# Patient Record
Sex: Male | Born: 1955 | Race: White | Hispanic: No | Smoking: Never smoker
Health system: Southern US, Community
[De-identification: ages and names within clinical notes are randomized; demographics above are authoritative.]

## PROBLEM LIST (undated history)

## (undated) DIAGNOSIS — I10 Essential (primary) hypertension: Secondary | ICD-10-CM

## (undated) DIAGNOSIS — F329 Major depressive disorder, single episode, unspecified: Secondary | ICD-10-CM

## (undated) DIAGNOSIS — F419 Anxiety disorder, unspecified: Secondary | ICD-10-CM

## (undated) DIAGNOSIS — E785 Hyperlipidemia, unspecified: Secondary | ICD-10-CM

## (undated) DIAGNOSIS — F319 Bipolar disorder, unspecified: Secondary | ICD-10-CM

## (undated) DIAGNOSIS — E119 Type 2 diabetes mellitus without complications: Secondary | ICD-10-CM

## (undated) DIAGNOSIS — F32A Depression, unspecified: Secondary | ICD-10-CM

## (undated) DIAGNOSIS — N289 Disorder of kidney and ureter, unspecified: Secondary | ICD-10-CM

## (undated) HISTORY — PX: COLONOSCOPY: SHX174

## (undated) HISTORY — DX: Bipolar disorder, unspecified: F31.9

## (undated) HISTORY — DX: Depression, unspecified: F32.A

## (undated) HISTORY — DX: Anxiety disorder, unspecified: F41.9

## (undated) HISTORY — DX: Disorder of kidney and ureter, unspecified: N28.9

## (undated) HISTORY — DX: Type 2 diabetes mellitus without complications: E11.9

## (undated) HISTORY — DX: Major depressive disorder, single episode, unspecified: F32.9

---

## 1999-10-01 ENCOUNTER — Emergency Department (HOSPITAL_COMMUNITY): Admission: EM | Admit: 1999-10-01 | Discharge: 1999-10-01 | Payer: Self-pay | Admitting: Emergency Medicine

## 1999-10-01 ENCOUNTER — Encounter: Payer: Self-pay | Admitting: Emergency Medicine

## 2001-09-03 ENCOUNTER — Encounter: Payer: Self-pay | Admitting: *Deleted

## 2001-09-03 ENCOUNTER — Inpatient Hospital Stay (HOSPITAL_COMMUNITY): Admission: EM | Admit: 2001-09-03 | Discharge: 2001-09-05 | Payer: Self-pay | Admitting: Emergency Medicine

## 2001-09-04 ENCOUNTER — Encounter: Payer: Self-pay | Admitting: Critical Care Medicine

## 2005-04-03 ENCOUNTER — Encounter: Admission: RE | Admit: 2005-04-03 | Discharge: 2005-07-02 | Payer: Self-pay | Admitting: Geriatric Medicine

## 2005-07-14 ENCOUNTER — Encounter: Admission: RE | Admit: 2005-07-14 | Discharge: 2005-10-12 | Payer: Self-pay | Admitting: Geriatric Medicine

## 2010-11-26 ENCOUNTER — Emergency Department (HOSPITAL_COMMUNITY)
Admission: EM | Admit: 2010-11-26 | Discharge: 2010-11-26 | Payer: Self-pay | Source: Home / Self Care | Admitting: Emergency Medicine

## 2011-02-20 LAB — POCT I-STAT, CHEM 8
BUN: 13 mg/dL (ref 6–23)
Calcium, Ion: 1.15 mmol/L (ref 1.12–1.32)
Chloride: 105 mEq/L (ref 96–112)
Creatinine, Ser: 1 mg/dL (ref 0.4–1.5)
Glucose, Bld: 161 mg/dL — ABNORMAL HIGH (ref 70–99)
HCT: 48 % (ref 39.0–52.0)
Hemoglobin: 16.3 g/dL (ref 13.0–17.0)
Potassium: 3.7 mEq/L (ref 3.5–5.1)
Sodium: 140 mEq/L (ref 135–145)
TCO2: 25 mmol/L (ref 0–100)

## 2011-04-28 NOTE — H&P (Signed)
[place]. [facility]  Patient:    Alan Reeves, Alan Reeves Visit Number: [id number] MRN: [id number]          Service Type: MED Location: MICU 2113 01 Attending Physician:  Caleb Popp Dictated by:   Charlcie Cradle Delford Field, M.D. [facility] Admit Date:  09/03/2001   CC:         Alan Reeves, M.D.  [facility]   History and Physical  CHIEF COMPLAINT:  Overdose ingestion of methanol and lithium.  HISTORY OF PRESENT ILLNESS:  This is a 55 year old white male with a previous history of long-standing depression.  He had been taken off Lithium since June of this year and taking over-the-counter medications for depression.  He noted increased moodiness, and increased levels of depression over the last week. He began decreasing his oral intake and then today, [facility] and receiving counseling today, took 41, 300 mg Lithium tablets, short acting, and approximately 8-10 ounces of windshield wiper fluid containing methanol.  He had immediate emesis after taking the windshield wiper fluid.  It is uncertain how much was actually absorbed.  He also had some diarrhea.  No real headaches, no real neurologic change.  No chest pain, no real shortness of breath, no real cough, no change in vision, no abdominal pain, no muscle spasm or weakness noted.  No dizziness noted, but does not continuing nausea in spite of emesis earlier today.  He had about five episodes of emesis with the diarrhea earlier today.  The patient now states he is hungry and wants to take something by mouth.  He drove himself to the emergency room and presented here this afternoon.  He took this ingestion at approximately 3:30 p.m. today.  PAST MEDICAL HISTORY:  Medical history of depression as noted above, otherwise noncontributory.  ALLERGIES:  NONE.  OPERATIONS:  Arthroscopic surgery in the past of the knee, left sided  lipoma removal.  SOCIAL HISTORY:  The patient is a nonsmoker, does not drink alcohol.  He is separated from his wife.  His daughter is 90 years of age and now living with the wife.  He does not use recreational drugs.  He is currently unemployed Medical illustrator.  REVIEW OF SYSTEMS:  Otherwise noncontributory.  PHYSICAL EXAMINATION:  VITAL SIGNS:  Temperature 97, blood pressure 122/85, pulse 70, respirations 16, saturations 97% on room air.  CHEST:  Entirely clear to auscultation and percussion.  There is no evidence of wheeze, rale, or rhonchi.  CARDIAC:  Exam showed a regular rate and rhythm with S3, no S1 or S2.  ABDOMEN: Soft, nontender.  Bowel sounds are active.  EXTREMITIES:  Showed no edema, clubbing or ___________.  NEUROLOGIC:  Intact.  There were no focal deficits noted.  HEENT: Oropharynx is clear.  Nares are clear.  No thyromegaly.  LABORATORY DATA:  Sodium 134, potassium 3.8, chloride  104, CO2 22, BUN 10. Hemoglobin 15.  Total CO2 now 22, pH 7.27, PCO2 45, PO2 490 on oxygen therapy. EKG showed normal sinus rhythm. Chest x-ray is pending at the time of this dictation.  EKG:  Normal sinus rhythm, no acute changes.  IMPRESSION: Overdose of methanol and Lithium carbonate.  Patient needs renal consultation and evaluation for degrees of levels of these intoxicants.  The patient also has an associated metabolic acidosis with an increase in the anion gap.  RECOMMENDATIONS:  Give IV bicarbonate, give IV fluids using hypotonic saline. Administer fomepizole 1200 mg IV  times loading dose than 800 mg IV q.12 for 48 hours then 1200 mg IV every 12 hours thereafter.  Also administer folic acid or leucovorin 50 mg IV q.4h. for the next 24 hours.  The patient had a lithium level and methanol level obtained.  Also check CMET, CBC, TSH levels.  Chest chest x-ray, check serum osmolarity levels and obtain renal consultation for possible hemodialysis.  Admit to the intensive care unit  for further monitoring. Dictated by:   Charlcie Cradle Delford Field, M.D. [facility] Attending Physician:  Caleb Popp DD:  09/03/01 TD:  09/03/01 Job: [phone] [id number]

## 2011-04-28 NOTE — Consult Note (Signed)
[place]. [facility]  Patient:    Alan Reeves, Alan Reeves Visit Number: [id number] MRN: [id number]          Service Type: MED Location: MICU 2113 01 Attending Physician:  Caleb Popp Dictated by:   Kevin Fenton, M.D. Admit Date:  09/03/2001                            Consultation Report  CHIEF COMPLAINT:  A lithium and methanol overdose.  HISTORY OF PRESENT ILLNESS:  This is a 55 year old Caucasian male who was admitted from the [facility] ER secondary to a lithium and methanol ingestion today.  Patient has a history of depression with suicidal ideation.  He admits to worsening depression over the past two weeks with acute worsening in the past two days.  He was seen by mental health yesterday and given a prescription for the lithium controlled-release tablets.  Today he took 41 300 mg controlled-release tablets between 3 and 3:30 p.m. today and at 3:30, drank 8 to 10 ounces of windshield washer fluid.  Patient wanted to go to sleep and "wake up in heaven."  He admits to this being a suicidal gesture. He states that he is not suicidal now.  Patient drove himself to the ER at approximately 3:45, where he remains.  He has vomited four or five times since then and had diarrhea x 3.  He says the lithium tablets he took are pink.  PAST MEDICAL HISTORY:  Positive for depression with hospitalization in 1982, 1989 and 1995; last one was for a suicidal gesture with Tylenol and Zoloft overdose.  He was under the care of [facility] until June of 2002.  MEDICATIONS:  He takes none but he was taking Sam-e until one week ago.  ALLERGIES:  No known drug allergies.  SOCIAL HISTORY:  He is divorced x 4 and lives with his mother.  Denies alcohol, drugs or tobacco.  FAMILY HISTORY:  Positive for bipolar.  Negative for any kidney diseases.  REVIEW OF SYSTEMS:  As above.  PHYSICAL EXAMINATION:  VITAL SIGNS:  Heart rate 60, BP 122/85, respiratory  rate 22.  Weight is 80 g.  GENERAL:  He is alert and oriented x 4.  He is bright and alert but has very poor insight.  LUNGS:  Clear to auscultation bilaterally.  HEENT:  Disks margins are sharp.  ABDOMEN:  Soft, nontender.  Good bowel sounds.  EXTREMITIES:  No edema.  DP pulses are 2+.  LABORATORY DATA:  Labs are pending at this time.  ASSESSMENT AND PLAN:  [age]-year-old Caucasian male with intentional lithium and methanol overdose.  Will give charcoal and sorbitol 100 g p.o. The lithium are sustained-release and this will help to keep the absorption minimized.  We will also place Foley to gravity with normal saline at 300 cc/hr.  Will attempt to flush the amount of lithium through the proximal tubules of the kidneys with sodium to cause excretion.  Patient is now likely to get increased hypernatremia in the first 24 hours of normal saline and we need to excrete the sodium in order to excrete the lithium so we will keep patient volume repleted.  If the lithium level is greater than 4, we will dialyze immediately; if the lithium level is 2.5 to 4.0, will dialyze if urine output is inadequate.  We will also thyroid-stimulating hormone.  For methanol, we will use fomepizole 50 mg/kg, a loading dose, then 10  mg/kg q.12h. for 48 hours.  This inhibits alcohol dehydrogenase.  Will also give folic acid, thiamine and pyridoxine.  We will be closely following electrolytes ______ changes and acid base balances.  Methanol can cause profound metabolic anion gap acidosis. Dictated by:   Kevin Fenton, M.D. Attending Physician:  Caleb Popp DD:  09/03/01 TD:  09/04/01 Job: [phone] [id number]

## 2011-04-28 NOTE — Discharge Summary (Signed)
[place]. [facility]  Patient:    Alan Reeves, Alan Reeves Visit Number: [id number] MRN: [id number]          Service Type: MED Location: MICU 2113 01 Attending Physician:  Caleb Popp Dictated by:   Charlcie Cradle Delford Field, M.D. [facility] Admit Date:  09/03/2001 Discharge Date: 09/05/2001   CC:         Fax to [facility]  Patients chart  Alan Reeves, M.D.   Discharge Summary  DISCHARGE DIAGNOSES: 1. Methanol and lithium overdose. 2. Severe depression.  HISTORY OF PRESENT ILLNESS:  This is a 55 year old white male admitted from the emergency department with depression and overdose of windshield wiper fluid as well as toxic levels of lithium.  Patient is admitted now to the intensive care for further inpatient care.  PAST MEDICAL HISTORY:  Depression.  OPERATIONS:  Arthroscopic knee surgery, lipoma removal, left side.  SOCIAL HISTORY:  Patient is separated, has one daughter living with his wife in [place].  Is a nonsmoker, does not drink.  FAMILY HISTORY:  Noncontributory.  REVIEW OF SYSTEMS:  Noncontributory.  ALLERGIES:  None.  MEDICATIONS PRIOR TO ADMISSION:  None.  Was taking over-the-counter medication but had old lithium carbonate tablets available at home and took these on his own.  PHYSICAL EXAMINATION:  VITAL SIGNS:  Temperature 96, pulse 75, blood pressure 122/70, respirations 19.  GENERAL:  This is an awake and alert white male in no acute distress.  CHEST:  Clear.  CARDIAC:  Exam showed a regular rate and rhythm without S3, S4, normal S1, S2.  ABDOMEN:  Soft, nontender.  Bowel sounds active.  EXTREMITIES:  No edema, or clubbing, or venous disease.  NEUROLOGIC:  Intact.  LABORATORY DATA:  Acetaminophen level 2, lithium level 2, creatinine 0.9, serum osmolality 302.  Sodium 142, potassium 3.9, chloride 107, BUN 11, hemoglobin 10.7.  CO2 of 29.  HOSPITAL COURSE:  Patient was treated with IV fluids, ______  infusion, thiamine, and folic acid.  Overall, the patient did well and was medically clear for discharge to psychiatric facility on September 05, 2001.  Patient will be discharged and committed to the [facility] for further psychiatric care for his severe depression. Dictated by:   Charlcie Cradle Delford Field, M.D. [facility] Attending Physician:  Caleb Popp DD:  09/05/01 TD:  09/05/01 Job: [phone] [id number]

## 2012-05-09 IMAGING — CT CT HEAD W/O CM
1 series · 15 of 30 positions shown, 19 images · non-contrast
Comparison: None.

CLINICAL DATA: Vertigo; multiple episodes of dizziness for 3 days.
Headache.  History of bipolar disorder, hypertension and diabetes.

CT HEAD WITHOUT CONTRAST
TECHNIQUE: Contiguous axial images were obtained from the base of
the skull through the vertex without contrast.

[Series 2: headseq 4.8 h45s · axial · 0.43mm/px · z∈[-174,-19]mm · 15 of 36 slices shown, 19 images]
[im 2/36  brain]
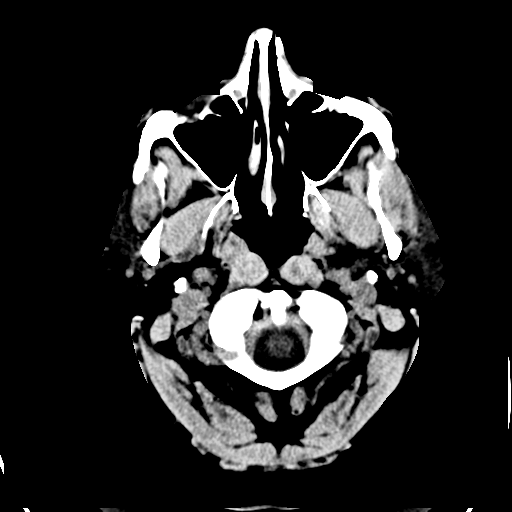
[im 2/36  bone]
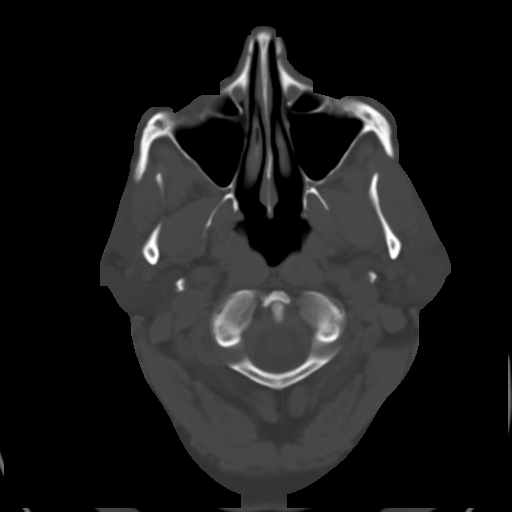
[im 4/36  brain]
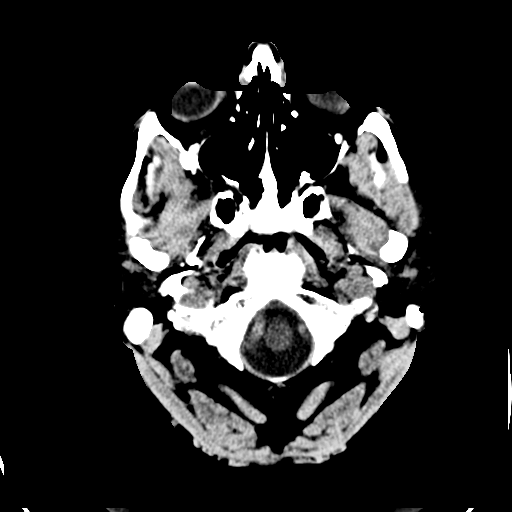
[im 7/36  brain]
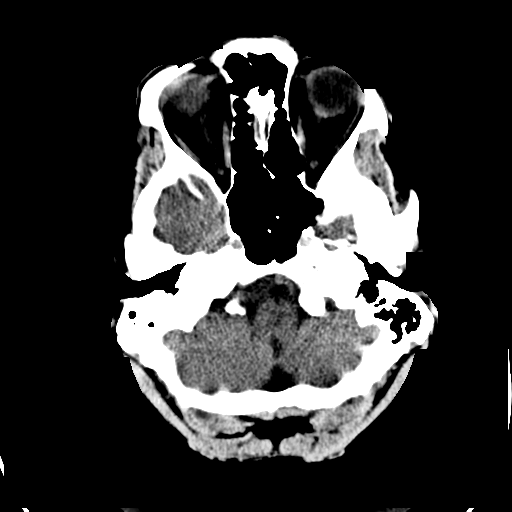
[im 9/36  brain]
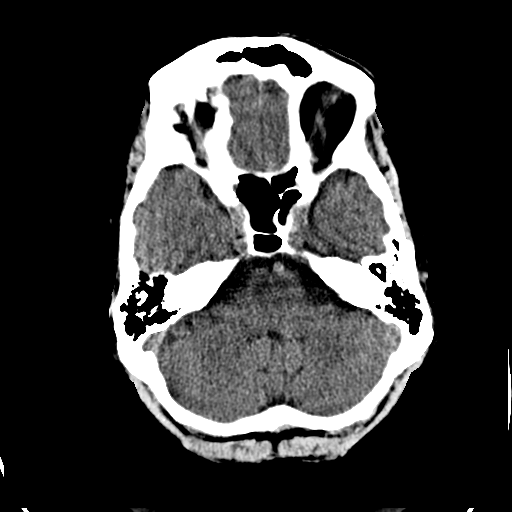
[im 11/36  brain]
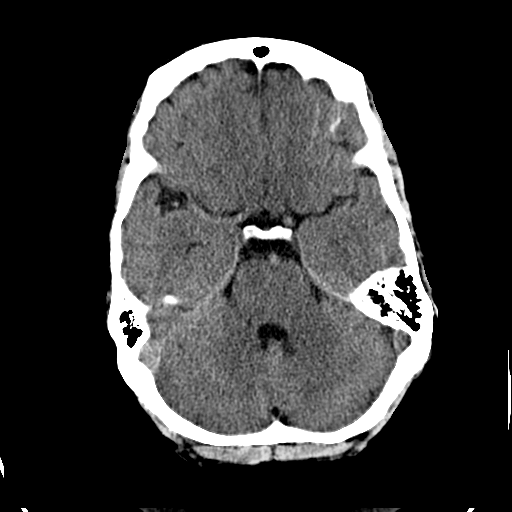
[im 11/36  bone]
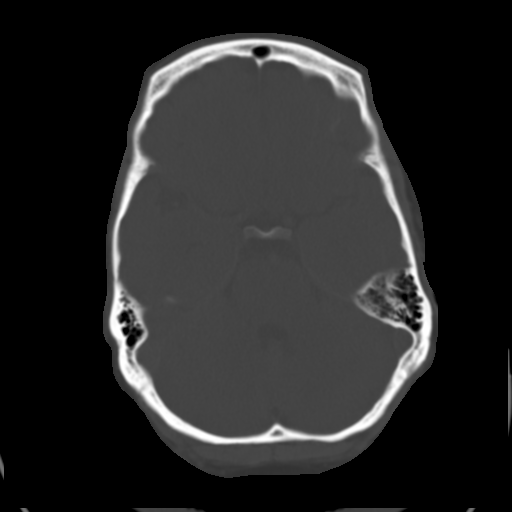
[im 14/36  brain]
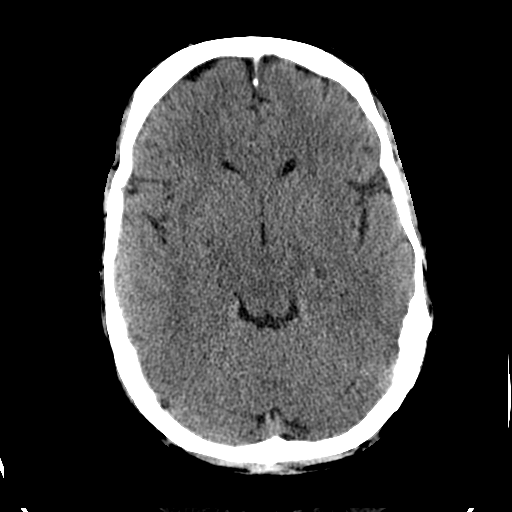
[im 16/36  brain]
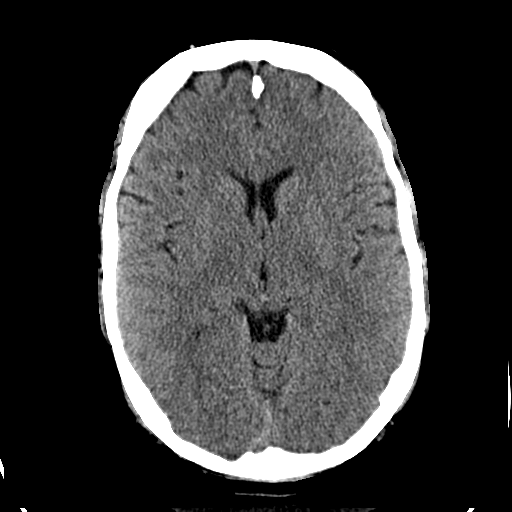
[im 19/36  brain]
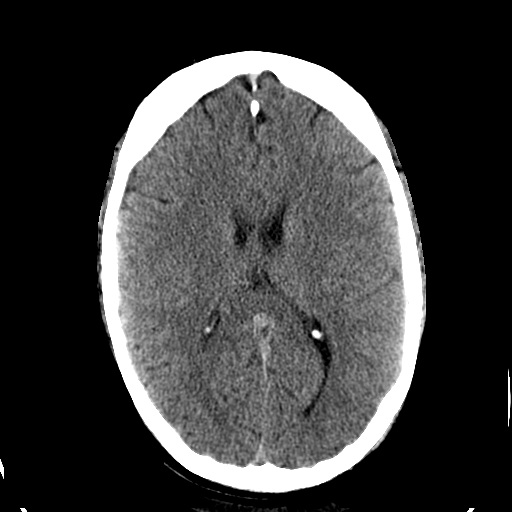
[im 20/36  brain]
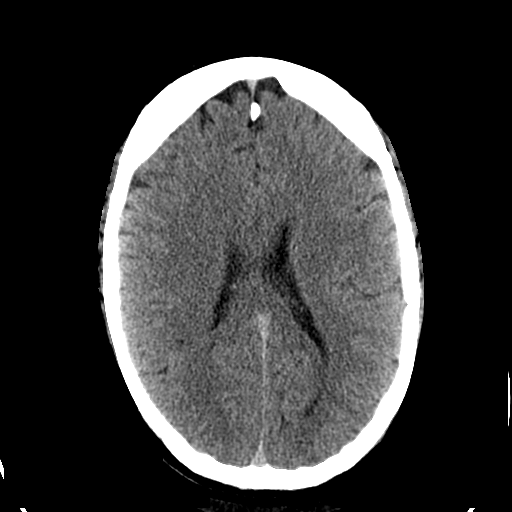
[im 20/36  bone]
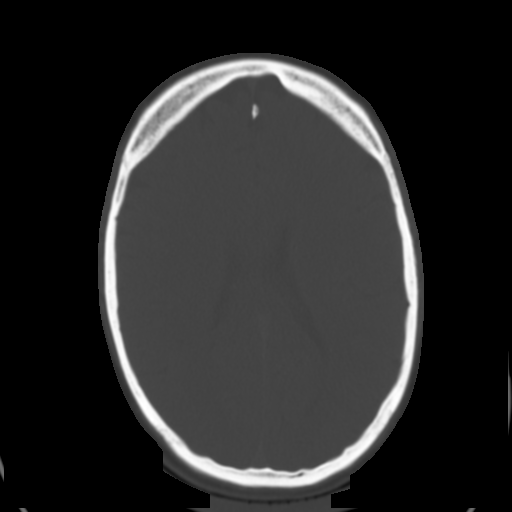
[im 22/36  brain]
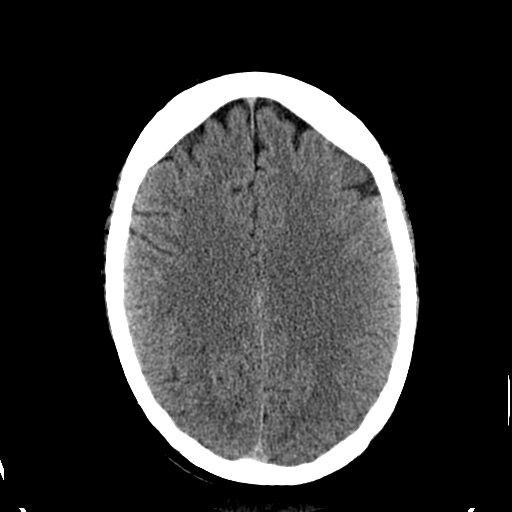
[im 25/36  brain]
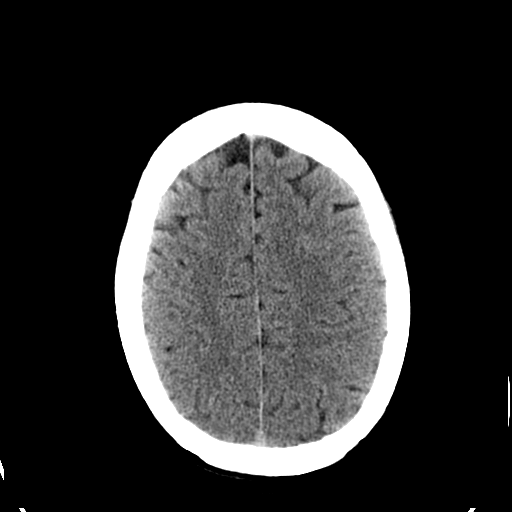
[im 27/36  brain]
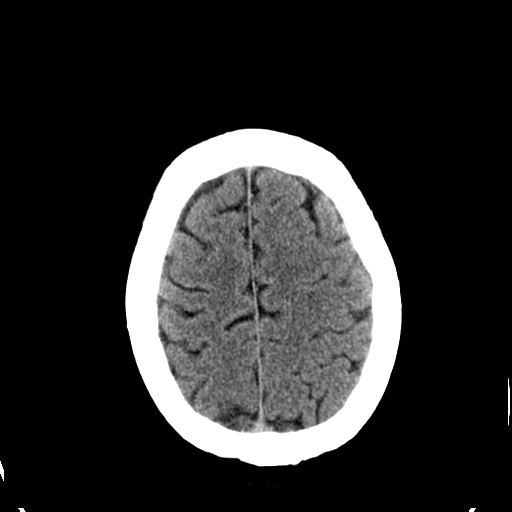
[im 29/36  brain]
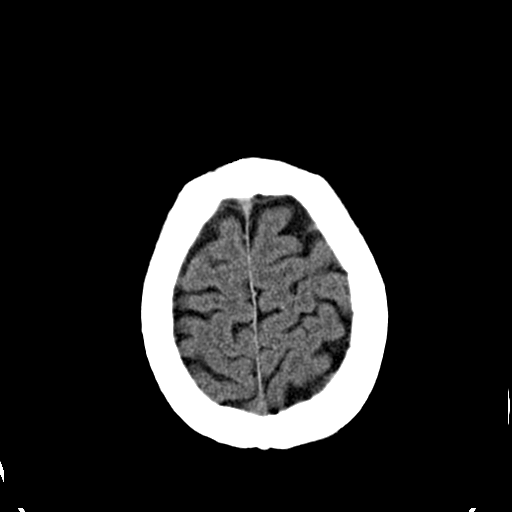
[im 29/36  bone]
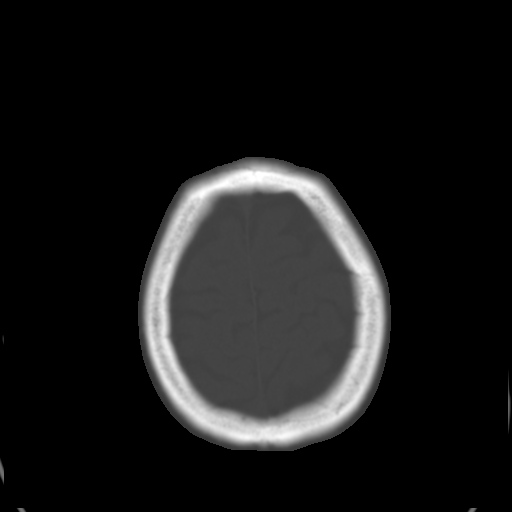
[im 32/36  brain]
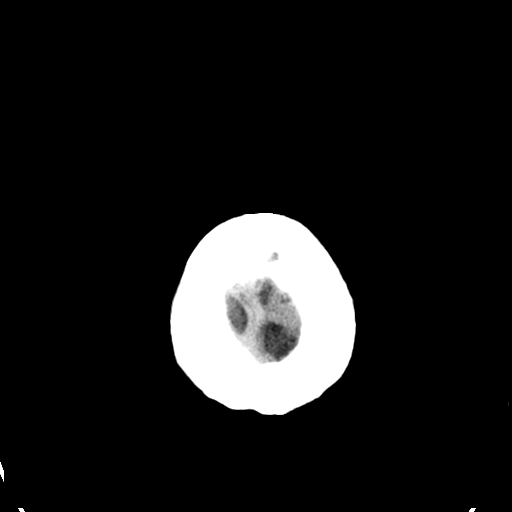
[im 34/36  brain]
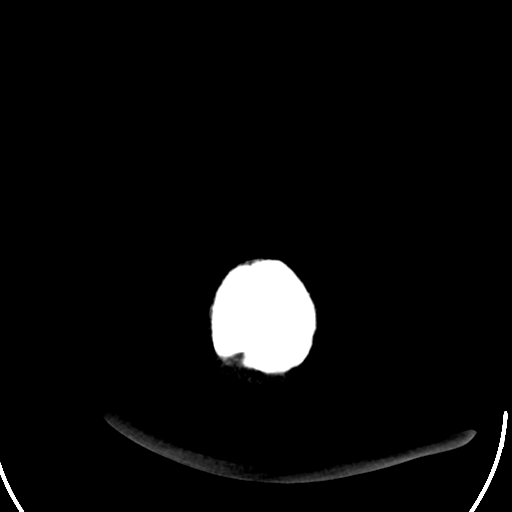

[15 of 30 positions shown; findings below may reference images not displayed]

FINDINGS: There is no evidence of acute infarction, mass lesion, or
intra- or extra-axial hemorrhage on CT.

A small focus of hypoattenuation within the left parietal lobe may
reflect either a normal sulcus or a small chronic lacunar infarct.

The posterior fossa, including the cerebellum, brainstem and fourth
ventricle, is within normal limits.  The third and lateral
ventricles, and basal ganglia are unremarkable in appearance.  The
cerebral hemispheres demonstrate normal gray-white differentiation.
No mass effect or midline shift is seen.

There is no evidence of fracture; there is suggestion of a small 6
mm osteoma within the right mastoid air cells.  The orbits are
within normal limits.  The paranasal sinuses and mastoid air cells
are well-aerated.  No significant soft tissue abnormalities are
seen.
IMPRESSION: 1.  No acute intracranial pathology seen.
2.  Small focus of hypoattenuation in the left parietal lobe may
reflect either a normal sulcus or a small chronic lacunar infarct.
3.  Likely small 6 mm osteoma noted at the right mastoid air cells.

## 2014-03-31 ENCOUNTER — Encounter: Payer: Self-pay | Admitting: Internal Medicine

## 2014-03-31 ENCOUNTER — Ambulatory Visit: Payer: Self-pay | Attending: Internal Medicine | Admitting: Internal Medicine

## 2014-03-31 VITALS — BP 132/89 | HR 87 | Temp 98.8°F | Resp 16 | Ht 71.0 in | Wt 205.0 lb

## 2014-03-31 DIAGNOSIS — Z7689 Persons encountering health services in other specified circumstances: Secondary | ICD-10-CM

## 2014-03-31 DIAGNOSIS — R7309 Other abnormal glucose: Secondary | ICD-10-CM

## 2014-03-31 DIAGNOSIS — F411 Generalized anxiety disorder: Secondary | ICD-10-CM | POA: Insufficient documentation

## 2014-03-31 DIAGNOSIS — E119 Type 2 diabetes mellitus without complications: Secondary | ICD-10-CM | POA: Insufficient documentation

## 2014-03-31 DIAGNOSIS — R739 Hyperglycemia, unspecified: Secondary | ICD-10-CM

## 2014-03-31 DIAGNOSIS — Z7189 Other specified counseling: Secondary | ICD-10-CM

## 2014-03-31 DIAGNOSIS — F319 Bipolar disorder, unspecified: Secondary | ICD-10-CM | POA: Insufficient documentation

## 2014-03-31 DIAGNOSIS — Z008 Encounter for other general examination: Secondary | ICD-10-CM | POA: Insufficient documentation

## 2014-03-31 LAB — CBC WITH DIFFERENTIAL/PLATELET
Basophils Absolute: 0.1 10*3/uL (ref 0.0–0.1)
Basophils Relative: 1 % (ref 0–1)
EOS ABS: 0.3 10*3/uL (ref 0.0–0.7)
EOS PCT: 3 % (ref 0–5)
HCT: 43.6 % (ref 39.0–52.0)
HEMOGLOBIN: 15.1 g/dL (ref 13.0–17.0)
LYMPHS ABS: 2.3 10*3/uL (ref 0.7–4.0)
Lymphocytes Relative: 27 % (ref 12–46)
MCH: 29.5 pg (ref 26.0–34.0)
MCHC: 34.6 g/dL (ref 30.0–36.0)
MCV: 85.2 fL (ref 78.0–100.0)
MONOS PCT: 8 % (ref 3–12)
Monocytes Absolute: 0.7 10*3/uL (ref 0.1–1.0)
NEUTROS PCT: 61 % (ref 43–77)
Neutro Abs: 5.1 10*3/uL (ref 1.7–7.7)
Platelets: 313 10*3/uL (ref 150–400)
RBC: 5.12 MIL/uL (ref 4.22–5.81)
RDW: 13.4 % (ref 11.5–15.5)
WBC: 8.4 10*3/uL (ref 4.0–10.5)

## 2014-03-31 LAB — POCT GLYCOSYLATED HEMOGLOBIN (HGB A1C): Hemoglobin A1C: 8.5

## 2014-03-31 LAB — COMPREHENSIVE METABOLIC PANEL
ALT: 29 U/L (ref 0–53)
AST: 18 U/L (ref 0–37)
Albumin: 4.4 g/dL (ref 3.5–5.2)
Alkaline Phosphatase: 123 U/L — ABNORMAL HIGH (ref 39–117)
BILIRUBIN TOTAL: 0.4 mg/dL (ref 0.2–1.2)
BUN: 15 mg/dL (ref 6–23)
CALCIUM: 9.6 mg/dL (ref 8.4–10.5)
CHLORIDE: 102 meq/L (ref 96–112)
CO2: 24 mEq/L (ref 19–32)
Creat: 1.12 mg/dL (ref 0.50–1.35)
GLUCOSE: 357 mg/dL — AB (ref 70–99)
Potassium: 4.4 mEq/L (ref 3.5–5.3)
Sodium: 135 mEq/L (ref 135–145)
TOTAL PROTEIN: 7.3 g/dL (ref 6.0–8.3)

## 2014-03-31 LAB — LIPID PANEL
Cholesterol: 164 mg/dL (ref 0–200)
HDL: 34 mg/dL — ABNORMAL LOW (ref >39)
LDL Cholesterol: 57 mg/dL (ref 0–99)
TRIGLYCERIDES: 367 mg/dL — AB (ref <150)
Total CHOL/HDL Ratio: 4.8 Ratio
VLDL: 73 mg/dL — AB (ref 0–40)

## 2014-03-31 LAB — GLUCOSE, POCT (MANUAL RESULT ENTRY): POC Glucose: 397 mg/dl — AB (ref 70–99)

## 2014-03-31 LAB — TSH: TSH: 0.613 u[IU]/mL (ref 0.350–4.500)

## 2014-03-31 MED ORDER — METFORMIN HCL 500 MG PO TABS: 500.0000 mg | ORAL_TABLET | Freq: Two times a day (BID) | ORAL | Status: DC

## 2014-03-31 MED ORDER — LISINOPRIL 20 MG PO TABS: 20.0000 mg | ORAL_TABLET | Freq: Every day | ORAL | Status: DC

## 2014-03-31 MED ORDER — INSULIN NPH (HUMAN) (ISOPHANE) 100 UNIT/ML ~~LOC~~ SUSP
20.0000 [IU] | Freq: Once | SUBCUTANEOUS | Status: AC
  Administered 2014-03-31: 20 [IU] via SUBCUTANEOUS

## 2014-03-31 MED ORDER — GLIPIZIDE 5 MG PO TABS: 5.0000 mg | ORAL_TABLET | Freq: Every day | ORAL | Status: DC

## 2014-03-31 NOTE — Progress Notes (Signed)
Pt comes in to establish care for Diabetes Type 2 and Depression/Anxiety which is [facility] Pt ran out of Metformin x 1 mnth ago but is taking Glipizide 5 mg dosage Denies blurry vision,nausea or dizziness Last checked CBG and A1C 6 mnths ago Pt prefers [facility] pharmacy Taking Lisinopril 20 mg for kidney reserve Pt had colonoscopy screening 3 mnths ago

## 2014-03-31 NOTE — Progress Notes (Signed)
Patient ID: Alan MulliganOliver J Axelrod, male   DOB: June 09, 1956, 58 y.o.   MRN: [id number]     ION:[id number]:[id number]  KGM:[id number]  DOB - June 09, 1956  CC:  Chief Complaint  Patient presents with  . Establish Care  . Diabetes  . Depression  . Anxiety       HPI: Alan RougeOliver Reeves is a 58 y.o. male with a past medical history of DM, bipolar disorder, anxiety, and depression. Patient is here to establish care and to get medication refills.  Patient states he has been out of his Metformin for over one month and noticed he was starting to have increased symptoms relating to his diabetes.  Patient reports that he was beginning to have polydipsia, blurred vision, and fatigue and decided to come to the doctor.  Patient states that his hemoglobin a1c is usually less than 7 and always controlled. He reports mild left shoulder muscle pain but relates it to his job.  Patient reports he had a colonoscopy last year and had a dilated eye exam 5 months ago.  He is up to date on his tetanus shot.   Patient has No headache, No chest pain, No abdominal pain - No Nausea, No new weakness tingling or numbness, No Cough - SOB.  No Known Allergies Past Medical History  Diagnosis Date  . Anxiety   . Depression   . Diabetes mellitus without complication    No current outpatient prescriptions on file prior to visit.   No current facility-administered medications on file prior to visit.   History reviewed. No pertinent family history. History   Social History  . Marital Status: Divorced    Spouse Name: N/A    Number of Children: N/A  . Years of Education: N/A   Occupational History  . Not on file.   Social History Main Topics  . Smoking status: Never Smoker   . Smokeless tobacco: Not on file  . Alcohol Use: No  . Drug Use: No  . Sexual Activity: Not on file   Other Topics Concern  . Not on file   Social History Narrative  . No narrative on file    Review of Systems: Constitutional: Negative for fever,  chills, diaphoresis, activity change, appetite change. +fatigue, polydipsia HENT: Negative for ear pain, nosebleeds, congestion, facial swelling, rhinorrhea, neck pain, neck stiffness and ear discharge.  Eyes: Negative for pain, discharge, redness, itching. + blurred vision Respiratory: Negative for cough, choking, chest tightness, shortness of breath, wheezing and stridor.  Cardiovascular: Negative for chest pain, palpitations and leg swelling. Gastrointestinal: Negative for abdominal distention. Genitourinary: Negative for dysuria, urgency, frequency, hematuria, flank pain, decreased urine volume, difficulty urinating and dyspareunia.  Musculoskeletal: Negative for back pain, joint swelling, arthralgia and gait problem. + occasional left shoulder pain Neurological: Negative for dizziness, tremors, seizures, syncope, facial asymmetry, speech difficulty, weakness, light-headedness, numbness and headaches.  Hematological: Negative for adenopathy. Does not bruise/bleed easily. Psychiatric/Behavioral: Negative for hallucinations, behavioral problems, confusion, dysphoric mood, decreased concentration and agitation.    Objective:   Filed Vitals:   03/31/14 1520  BP: 132/89  Pulse: 87  Temp: 98.8 F (37.1 C)  Resp: 16    Physical Exam: Constitutional: Patient appears well-developed and well-nourished. No distress. HENT: Normocephalic, atraumatic, External right and left ear normal. Oropharynx is clear and moist.  Eyes: Conjunctivae and EOM are normal. PERRLA, no scleral icterus. Neck: Normal ROM. Neck supple. No JVD. No tracheal deviation. No thyromegaly. CVS: RRR, S1/S2 +, no murmurs, no gallops, no carotid bruit.  Pulmonary: Effort and breath sounds normal, no stridor, rhonchi, wheezes, rales.  Abdominal: Soft. BS +, no distension, tenderness, rebound or guarding.  Musculoskeletal: Normal range of motion. No edema and no tenderness.  Lymphadenopathy: No lymphadenopathy noted, cervical,    Neuro: Alert. Normal reflexes, muscle tone coordination. No cranial nerve deficit. Skin: Skin is warm and dry. No rash noted. Not diaphoretic. No erythema. No pallor. Psychiatric: Normal mood and affect. Behavior, judgment, thought content normal.  Lab Results  Component Value Date   HGB 16.3 11/26/2010   HCT 48.0 11/26/2010   Lab Results  Component Value Date   CREATININE 1.0 11/26/2010   BUN 13 11/26/2010   NA 140 11/26/2010   K 3.7 11/26/2010   CL 105 11/26/2010    Lab Results  Component Value Date   HGBA1C 8.5 03/31/2014   Lipid Panel  No results found for this basename: chol, trig, hdl, cholhdl, vldl, ldlcalc       Assessment and plan:   1. Diabetes - HgB A1c - Glucose (CBG) - metFORMIN (GLUCOPHAGE) 500 MG tablet; Take 1 tablet (500 mg total) by mouth 2 (two) times daily with a meal. Pt takes 1,000mg  at night.  Dispense: 90 tablet; Refill: 2 - lisinopril (PRINIVIL,ZESTRIL) 20 MG tablet; Take 1 tablet (20 mg total) by mouth daily.  Dispense: 30 tablet; Refill: 2 - glipiZIDE (GLUCOTROL) 5 MG tablet; Take 1 tablet (5 mg total) by mouth daily before breakfast.  Dispense: 30 tablet; Refill: 2  2. Hyperglycemia - insulin NPH Human (HUMULIN N,NOVOLIN N) injection 20 Units; Inject 0.2 mLs (20 Units total) into the skin once.  3. Encounter to establish care - CBC with Differential - Comprehensive metabolic panel - Lipid Panel - TSH - PSA   Return in about 3 months (around 06/30/2014) for Diabetes Mellitus.  The patient was told to call to get lab results if they haven't heard anything in the next week.     Holland CommonsValerie Branton Einstein, NP-C [facility] [phone] 03/31/2014, 10:46 PM

## 2014-03-31 NOTE — Patient Instructions (Signed)

## 2014-03-31 NOTE — Progress Notes (Signed)
CBG-397, A1C 8.5 20 units given per protocol

## 2014-04-01 ENCOUNTER — Other Ambulatory Visit: Payer: Self-pay | Admitting: Internal Medicine

## 2014-04-01 ENCOUNTER — Telehealth: Payer: Self-pay | Admitting: Emergency Medicine

## 2014-04-01 DIAGNOSIS — E781 Pure hyperglyceridemia: Secondary | ICD-10-CM

## 2014-04-01 LAB — PSA: PSA: 1.28 ng/mL (ref <=4.00)

## 2014-04-01 MED ORDER — OMEGA-3-ACID ETHYL ESTERS 1 G PO CAPS: 2.0000 g | ORAL_CAPSULE | Freq: Two times a day (BID) | ORAL | Status: DC

## 2014-04-01 NOTE — Telephone Encounter (Signed)
Message copied by Darlis LoanSMITH, Christean Silvestri D on Wed Apr 01, 2014  2:35 PM ------      Message from: Holland CommonsKECK, VALERIE A      Created: Wed Apr 01, 2014  9:46 AM       Call patient to let him know his triglycerides are high. I have sent omega 3 to our pharmacy.  He will need to take this twice daily until next follow up visit. With better control of DM, it should help improve triglycerides as well. Counsel patient on diet and exercise. May add more salmon to diet to help lower levels. Thanks ------

## 2014-04-01 NOTE — Telephone Encounter (Signed)
Pt given lab results and medication/diet instructions [facility]

## 2014-04-20 ENCOUNTER — Ambulatory Visit: Payer: Self-pay

## 2014-06-29 ENCOUNTER — Other Ambulatory Visit: Payer: Self-pay | Admitting: Internal Medicine

## 2014-06-30 ENCOUNTER — Encounter: Payer: Self-pay | Admitting: Internal Medicine

## 2014-06-30 ENCOUNTER — Ambulatory Visit: Payer: No Typology Code available for payment source | Attending: Internal Medicine | Admitting: Internal Medicine

## 2014-06-30 VITALS — BP 123/65 | HR 74 | Temp 97.8°F | Resp 16 | Ht 71.5 in | Wt 203.4 lb

## 2014-06-30 DIAGNOSIS — F411 Generalized anxiety disorder: Secondary | ICD-10-CM | POA: Insufficient documentation

## 2014-06-30 DIAGNOSIS — F329 Major depressive disorder, single episode, unspecified: Secondary | ICD-10-CM | POA: Insufficient documentation

## 2014-06-30 DIAGNOSIS — Z794 Long term (current) use of insulin: Secondary | ICD-10-CM | POA: Insufficient documentation

## 2014-06-30 DIAGNOSIS — M25519 Pain in unspecified shoulder: Secondary | ICD-10-CM | POA: Insufficient documentation

## 2014-06-30 DIAGNOSIS — F3289 Other specified depressive episodes: Secondary | ICD-10-CM | POA: Insufficient documentation

## 2014-06-30 DIAGNOSIS — Z79899 Other long term (current) drug therapy: Secondary | ICD-10-CM | POA: Insufficient documentation

## 2014-06-30 DIAGNOSIS — R7309 Other abnormal glucose: Secondary | ICD-10-CM

## 2014-06-30 DIAGNOSIS — E781 Pure hyperglyceridemia: Secondary | ICD-10-CM | POA: Insufficient documentation

## 2014-06-30 DIAGNOSIS — M25512 Pain in left shoulder: Secondary | ICD-10-CM

## 2014-06-30 DIAGNOSIS — M25511 Pain in right shoulder: Secondary | ICD-10-CM

## 2014-06-30 DIAGNOSIS — E119 Type 2 diabetes mellitus without complications: Secondary | ICD-10-CM | POA: Insufficient documentation

## 2014-06-30 DIAGNOSIS — R739 Hyperglycemia, unspecified: Secondary | ICD-10-CM

## 2014-06-30 LAB — GLUCOSE, POCT (MANUAL RESULT ENTRY)
POC GLUCOSE: 286 mg/dL — AB (ref 70–99)
POC Glucose: 278 mg/dl — AB (ref 70–99)

## 2014-06-30 LAB — POCT GLYCOSYLATED HEMOGLOBIN (HGB A1C): Hemoglobin A1C: 7.7

## 2014-06-30 MED ORDER — GLIPIZIDE 5 MG PO TABS: 5.0000 mg | ORAL_TABLET | Freq: Every day | ORAL | Status: DC

## 2014-06-30 MED ORDER — DICLOFENAC SODIUM 1 % TD GEL: 2.0000 g | Freq: Four times a day (QID) | TRANSDERMAL | Status: DC

## 2014-06-30 MED ORDER — LISINOPRIL 20 MG PO TABS: 20.0000 mg | ORAL_TABLET | Freq: Every day | ORAL | Status: DC

## 2014-06-30 MED ORDER — INSULIN ASPART 100 UNIT/ML ~~LOC~~ SOLN
10.0000 [IU] | Freq: Once | SUBCUTANEOUS | Status: AC
  Administered 2014-06-30: 10 [IU] via SUBCUTANEOUS

## 2014-06-30 MED ORDER — OMEGA-3-ACID ETHYL ESTERS 1 G PO CAPS: 2.0000 g | ORAL_CAPSULE | Freq: Two times a day (BID) | ORAL | Status: DC

## 2014-06-30 NOTE — Progress Notes (Signed)
Patient ID: Alan Reeves, male   DOB: March 30, 1956, 58 y.o.   MRN: [id number]  CC: follow up DM  HPI:  Patient is here for a three month follow up for DM.  Patient reports that he currently does not check his CBG at home because he does not like pricking himself.  He reports that he does take his medication daily.  Patient admits to eating lots of candy bars and sub bread.  Patient reports bilateral shoulder pain for the past two months.  It begins in bilateral shoulders and radiates to his elbows.  Pain is aggravated by reaching back or above his head.  Patient thinks he hurt himself when he was lifting a tanning bed for his daughter.    No Known Allergies Past Medical History  Diagnosis Date  . Anxiety   . Depression   . Diabetes mellitus without complication    Current Outpatient Prescriptions on File Prior to Visit  Medication Sig Dispense Refill  . glipiZIDE (GLUCOTROL) 5 MG tablet Take 1 tablet (5 mg total) by mouth daily before breakfast.  30 tablet  2  . lisinopril (PRINIVIL,ZESTRIL) 20 MG tablet Take 1 tablet (20 mg total) by mouth daily.  30 tablet  2  . lithium 300 MG tablet Take 300 mg by mouth 3 (three) times daily.      . metFORMIN (GLUCOPHAGE) 500 MG tablet TAKE ONE TABLET BY MOUTH WITH BREAKFAST, TAKE 2 TABLETS AT NIGHT  90 tablet  2  . venlafaxine (EFFEXOR) 75 MG tablet Take 75 mg by mouth 2 (two) times daily with a meal.      . omega-3 acid ethyl esters (LOVAZA) 1 G capsule Take 2 capsules (2 g total) by mouth 2 (two) times daily.  30 capsule  2   No current facility-administered medications on file prior to visit.   Family History  Problem Relation Age of Onset  . Hypertension Mother   . Diabetes Father   . Cancer Father    History   Social History  . Marital Status: Divorced    Spouse Name: N/A    Number of Children: N/A  . Years of Education: N/A   Occupational History  . Not on file.   Social History Main Topics  . Smoking status: Never Smoker   .  Smokeless tobacco: Not on file  . Alcohol Use: No  . Drug Use: No  . Sexual Activity: Not on file   Other Topics Concern  . Not on file   Social History Narrative  . No narrative on file    Review of Systems  Constitutional: Negative.   HENT: Negative.   Eyes: Negative.   Respiratory: Negative.   Cardiovascular: Negative.   Gastrointestinal: Negative.   Musculoskeletal:       Bilateral shoulder pain   Neurological: Negative for dizziness, tingling and sensory change.  Psychiatric/Behavioral: Negative.       Objective:   Filed Vitals:   06/30/14 1421  BP: 123/65  Pulse: 74  Temp: 97.8 F (36.6 C)  Resp: 16    Physical Exam: Constitutional: Patient appears well-developed and well-nourished. No distress. HENT: Normocephalic, atraumatic, External right and left ear normal. Oropharynx is clear and moist.  Eyes: Conjunctivae and EOM are normal. PERRLA, no scleral icterus. Neck: Normal ROM. Neck supple. No JVD. No tracheal deviation. No thyromegaly. CVS: RRR, S1/S2 +, no murmurs, no gallops, no carotid bruit.  Pulmonary: Effort and breath sounds normal, no stridor, rhonchi, wheezes, rales.  Abdominal: Soft.  BS +,  no distension, tenderness, rebound or guarding.  Musculoskeletal: Normal range of motion-some pain with left arm and shoulder ROM. No edema and no tenderness.  Lymphadenopathy: No lymphadenopathy noted, cervical, inguinal or axillary Neuro: Alert. Normal reflexes, muscle tone coordination.  Skin: Skin is warm and dry. No rash noted. Not diaphoretic. No erythema. No pallor. No diabetic ulcers on feet, 2+ bilateral pedal pulses Psychiatric: Normal mood and affect. Behavior, judgment, thought content normal.  Lab Results  Component Value Date   WBC 8.4 03/31/2014   HGB 15.1 03/31/2014   HCT 43.6 03/31/2014   MCV 85.2 03/31/2014   PLT 313 03/31/2014   Lab Results  Component Value Date   CREATININE 1.12 03/31/2014   BUN 15 03/31/2014   NA 135 03/31/2014   K 4.4  03/31/2014   CL 102 03/31/2014   CO2 24 03/31/2014    Lab Results  Component Value Date   HGBA1C 8.5 03/31/2014   Lipid Panel     Component Value Date/Time   CHOL 164 03/31/2014 1609   TRIG 367* 03/31/2014 1609   HDL 34* 03/31/2014 1609   CHOLHDL 4.8 03/31/2014 1609   VLDL 73* 03/31/2014 1609   LDLCALC 57 03/31/2014 1609       Assessment and plan:   Joelene MillinOliver was seen today for follow-up, diabetes and shoulder pain.  Diagnoses and associated orders for this visit:  Type 2 diabetes mellitus without complication - Glucose (CBG) - HgB A1c - glipiZIDE (GLUCOTROL) 5 MG tablet; Take 1 tablet (5 mg total) by mouth daily before breakfast. - lisinopril (PRINIVIL,ZESTRIL) 20 MG tablet; Take 1 tablet (20 mg total) by mouth daily. Stressed the importance of checking blood sugar. Advised patient to check sugar three times per day and return with log on next visit. Stressed to patient the importance of caring for self as diabetic. Hyperglycemia - insulin aspart (novoLOG) injection 10 Units; Inject 0.1 mLs (10 Units total) into the skin once. - Glucose (CBG)  Hypertriglyceridemia without hypercholesterolemia - omega-3 acid ethyl esters (LOVAZA) 1 G capsule; Take 2 capsules (2 g total) by mouth 2 (two) times daily.  Shoulder pain, bilateral - diclofenac sodium (VOLTAREN) 1 % GEL; Apply 2 g topically 4 (four) times daily.   Return in about 3 months (around 09/30/2014) for DM/HTN.     Holland CommonsKECK, VALERIE, NP-C [facility] [phone] 06/30/2014, 2:45 PM

## 2014-06-30 NOTE — Progress Notes (Signed)
Patient presents for 3 month F/U on DM C/O 2 month history of bilateral shoulder pain with decreased range of motion. States lifted tanning bed in April but didi not notice pain at that time

## 2014-06-30 NOTE — Patient Instructions (Signed)

## 2014-07-17 ENCOUNTER — Other Ambulatory Visit: Payer: Self-pay | Admitting: Internal Medicine

## 2014-09-24 DIAGNOSIS — M7540 Impingement syndrome of unspecified shoulder: Secondary | ICD-10-CM | POA: Insufficient documentation

## 2014-09-24 DIAGNOSIS — M7500 Adhesive capsulitis of unspecified shoulder: Secondary | ICD-10-CM | POA: Insufficient documentation

## 2014-09-28 ENCOUNTER — Other Ambulatory Visit: Payer: Self-pay | Admitting: Internal Medicine

## 2014-10-27 ENCOUNTER — Telehealth: Payer: Self-pay | Admitting: Emergency Medicine

## 2014-10-27 ENCOUNTER — Telehealth: Payer: Self-pay | Admitting: Internal Medicine

## 2014-10-27 NOTE — Telephone Encounter (Signed)
[facility] pharmacy calling to follow up on previous lisinopril (PRINIVIL,ZESTRIL) 20 MG tablet request.  Also would like to inform that patient received prescription for lisinopril 5 mg from Dr Olivia MackieWilliam Cho and would like confirm whether pt needs both scripts.

## 2014-10-27 NOTE — Telephone Encounter (Signed)
Left message for to call nurse line to clarify medication Lisinopril dose. Pt states he has been taking 5mg  tab for a long time. According to providers note/MAR, pt is supposed to take 20 mg  Please f/u [facility] called in for clarification

## 2014-10-27 NOTE — Telephone Encounter (Signed)
My notes reveal that he was on Lisinopril 20 mg and that was the last Rx I gave him a few months ago. Call [facility] to find out which Rx dose he has been filling and dates.

## 2014-10-28 ENCOUNTER — Telehealth: Payer: Self-pay | Admitting: Emergency Medicine

## 2014-10-28 NOTE — Telephone Encounter (Signed)
Attempt #2 - left message for pt to return call for medication clarification

## 2016-04-05 DIAGNOSIS — I10 Essential (primary) hypertension: Secondary | ICD-10-CM | POA: Insufficient documentation

## 2016-04-05 DIAGNOSIS — E1165 Type 2 diabetes mellitus with hyperglycemia: Secondary | ICD-10-CM | POA: Insufficient documentation

## 2016-07-07 DIAGNOSIS — E785 Hyperlipidemia, unspecified: Secondary | ICD-10-CM | POA: Insufficient documentation

## 2016-07-07 DIAGNOSIS — E663 Overweight: Secondary | ICD-10-CM | POA: Insufficient documentation

## 2016-11-15 DIAGNOSIS — Z23 Encounter for immunization: Secondary | ICD-10-CM | POA: Insufficient documentation

## 2016-11-15 DIAGNOSIS — H6122 Impacted cerumen, left ear: Secondary | ICD-10-CM | POA: Insufficient documentation

## 2016-11-15 DIAGNOSIS — H8110 Benign paroxysmal vertigo, unspecified ear: Secondary | ICD-10-CM | POA: Insufficient documentation

## 2017-10-08 DIAGNOSIS — H2513 Age-related nuclear cataract, bilateral: Secondary | ICD-10-CM | POA: Insufficient documentation

## 2017-10-08 DIAGNOSIS — E119 Type 2 diabetes mellitus without complications: Secondary | ICD-10-CM | POA: Insufficient documentation

## 2017-10-08 DIAGNOSIS — H4321 Crystalline deposits in vitreous body, right eye: Secondary | ICD-10-CM | POA: Insufficient documentation

## 2017-10-08 DIAGNOSIS — Z794 Long term (current) use of insulin: Secondary | ICD-10-CM | POA: Insufficient documentation

## 2017-10-08 DIAGNOSIS — Z7984 Long term (current) use of oral hypoglycemic drugs: Secondary | ICD-10-CM | POA: Insufficient documentation

## 2017-10-08 DIAGNOSIS — H16223 Keratoconjunctivitis sicca, not specified as Sjogren's, bilateral: Secondary | ICD-10-CM | POA: Insufficient documentation

## 2017-10-08 DIAGNOSIS — H524 Presbyopia: Secondary | ICD-10-CM | POA: Insufficient documentation

## 2017-10-08 DIAGNOSIS — H43393 Other vitreous opacities, bilateral: Secondary | ICD-10-CM | POA: Insufficient documentation

## 2020-05-19 ENCOUNTER — Emergency Department (HOSPITAL_COMMUNITY)
Admission: EM | Admit: 2020-05-19 | Discharge: 2020-05-20 | Disposition: A | Discharge status: Home / Self Care | Payer: 59 | Attending: Emergency Medicine | Admitting: Emergency Medicine

## 2020-05-19 ENCOUNTER — Other Ambulatory Visit: Payer: Self-pay

## 2020-05-19 ENCOUNTER — Encounter (HOSPITAL_COMMUNITY): Payer: Self-pay | Admitting: Emergency Medicine

## 2020-05-19 DIAGNOSIS — Y999 Unspecified external cause status: Secondary | ICD-10-CM | POA: Insufficient documentation

## 2020-05-19 DIAGNOSIS — Y939 Activity, unspecified: Secondary | ICD-10-CM | POA: Diagnosis not present

## 2020-05-19 DIAGNOSIS — Z7984 Long term (current) use of oral hypoglycemic drugs: Secondary | ICD-10-CM | POA: Insufficient documentation

## 2020-05-19 DIAGNOSIS — S30861A Insect bite (nonvenomous) of abdominal wall, initial encounter: Secondary | ICD-10-CM | POA: Diagnosis not present

## 2020-05-19 DIAGNOSIS — E119 Type 2 diabetes mellitus without complications: Secondary | ICD-10-CM | POA: Diagnosis not present

## 2020-05-19 DIAGNOSIS — S40262A Insect bite (nonvenomous) of left shoulder, initial encounter: Secondary | ICD-10-CM | POA: Insufficient documentation

## 2020-05-19 DIAGNOSIS — W57XXXA Bitten or stung by nonvenomous insect and other nonvenomous arthropods, initial encounter: Secondary | ICD-10-CM | POA: Insufficient documentation

## 2020-05-19 DIAGNOSIS — Y92017 Garden or yard in single-family (private) house as the place of occurrence of the external cause: Secondary | ICD-10-CM | POA: Insufficient documentation

## 2020-05-19 MED ORDER — DOXYCYCLINE HYCLATE 100 MG PO CAPS: 100.0000 mg | ORAL_CAPSULE | Freq: Two times a day (BID) | ORAL | 0 refills | Status: AC

## 2020-05-19 MED ORDER — DOXYCYCLINE HYCLATE 100 MG PO TABS
100.0000 mg | ORAL_TABLET | Freq: Once | ORAL | Status: AC
  Administered 2020-05-19: 100 mg via ORAL
  Filled 2020-05-19: qty 1

## 2020-05-19 NOTE — ED Triage Notes (Signed)
Patient states he pulled two ticks off his back yesterday and today. Patient states he is itching and hurting in that area. Patient has a redden spot on lower right back and one on upper left shoulder.

## 2020-05-19 NOTE — Discharge Instructions (Signed)
We have gotten labs to test for Lyme disease given the tick bites.  We have prescribed her a course of antibiotics for this.  Follow-up outpatient with primary care provider.

## 2020-05-19 NOTE — ED Provider Notes (Signed)
[facility] DEPT Provider Note   CSN: [id number] Arrival date & time: 05/19/20  2010    History Chief Complaint  Patient presents with  . Tick Removal   Alan Reeves is a 64 y.o. male with no significant past medical history who presents for evaluation of tick bite.  Was playing with his grandson 5 days ago.  His grandson pulled a few ticks off himself 2 days ago.  First 1 to his left posterior shoulder yesterday evening and went to his right lower flank this afternoon.  He has had some pruritus to this area.  Has noticed a area of erythema surrounds both of these areas.  He is unsure if this is spread.  No fever, chills, nausea, vomiting chest pain, shortness of breath, vomiting, diarrhea, dysuria, target lesions.  Denies aggravating relieving or factors.  States he was able to get the entire tick off.  History obtained from patient and past medical records.  No interpreter is used.  HPI     Past Medical History:  Diagnosis Date  . Anxiety   . Depression   . Diabetes mellitus without complication ([place])     There are no problems to display for this patient.   History reviewed. No pertinent surgical history.     Family History  Problem Relation Age of Onset  . Hypertension Mother   . Diabetes Father   . Cancer Father     Social History   Tobacco Use  . Smoking status: Never Smoker  . Smokeless tobacco: Never Used  Substance Use Topics  . Alcohol use: No  . Drug use: No    Home Medications Prior to Admission medications   Medication Sig Start Date End Date Taking? Authorizing Provider  diclofenac sodium (VOLTAREN) 1 % GEL Apply 2 g topically 4 (four) times daily. 06/30/14   Lance Bosch, NP  doxycycline (VIBRAMYCIN) 100 MG capsule Take 1 capsule (100 mg total) by mouth 2 (two) times daily for 14 days. 05/19/20 06/02/20  Asiyah Pineau A, PA-C  glipiZIDE (GLUCOTROL) 5 MG tablet Take 1 tablet (5 mg total) by mouth daily before  breakfast. 06/30/14   Lance Bosch, NP  lisinopril (PRINIVIL,ZESTRIL) 20 MG tablet Take 1 tablet (20 mg total) by mouth daily. 06/30/14   Lance Bosch, NP  lithium 300 MG tablet Take 300 mg by mouth 3 (three) times daily.    [provider]  metFORMIN (GLUCOPHAGE) 500 MG tablet TAKE ONE TABLET BY MOUTH AT [facility] TAKE TWO TABLETS AT NIGHT 09/29/14   Lance Bosch, NP  omega-3 acid ethyl esters (LOVAZA) 1 G capsule Take 2 capsules (2 g total) by mouth 2 (two) times daily. 06/30/14   Lance Bosch, NP  venlafaxine (EFFEXOR) 75 MG tablet Take 75 mg by mouth 2 (two) times daily with a meal.    [provider]    Allergies    Patient has no known allergies.  Review of Systems   Review of Systems  Constitutional: Negative.   HENT: Negative.   Respiratory: Negative.   Cardiovascular: Negative.   Gastrointestinal: Negative.   Genitourinary: Negative.   Musculoskeletal: Negative.   Skin: Positive for rash.  Neurological: Negative.   All other systems reviewed and are negative.   Physical Exam Updated Vital Signs BP (!) 151/93 (BP Location: Left Arm)   Pulse 74   Temp 97.9 F (36.6 C) (Oral)   Resp 18   Ht 5\' 11"  (1.803 m)  Wt 97.5 kg   SpO2 98%   BMI 29.99 kg/m   Physical Exam Vitals and nursing note reviewed.  Constitutional:      General: He is not in acute distress.    Appearance: He is well-developed. He is not ill-appearing, toxic-appearing or diaphoretic.  HENT:     Head: Normocephalic and atraumatic.     Nose: Nose normal.     Mouth/Throat:     Mouth: Mucous membranes are moist.  Eyes:     Pupils: Pupils are equal, round, and reactive to light.  Cardiovascular:     Rate and Rhythm: Normal rate and regular rhythm.     Pulses: Normal pulses.     Heart sounds: Normal heart sounds.  Pulmonary:     Effort: Pulmonary effort is normal. No respiratory distress.     Breath sounds: Normal breath sounds.  Abdominal:     General: Bowel  sounds are normal. There is no distension.     Palpations: Abdomen is soft.  Musculoskeletal:        General: Normal range of motion.     Cervical back: Normal range of motion and neck supple.  Skin:    General: Skin is warm and dry.     Capillary Refill: Capillary refill takes less than 2 seconds.     Comments: 2 areas of 2 cm circular erythema #1 to left posterior shoulder, #1 to posterior right flank.  No target lesions, bulla, fluctuance, induration.  Neurological:     General: No focal deficit present.     Mental Status: He is alert.        ED Results / Procedures / Treatments   Labs (all labs ordered are listed, but only abnormal results are displayed) Labs Reviewed  LYME DISEASE, WESTERN BLOT    EKG None  Radiology No results found.  Procedures Procedures (including critical care time)  Medications Ordered in ED Medications - No data to display  ED Course  I have reviewed the triage vital signs and the nursing notes.  Pertinent labs & imaging results that were available during my care of the patient were reviewed by me and considered in my medical decision making (see chart for details).  65 year old male presents for evaluation of tick bite.  Likely sustained 4 days ago he was playing out with his grandson in the yard.  Afebrile, nonseptic, not ill-appearing.  He has 2 areas of surrounding erythema which are approximately 1 cm in diameter and rounded surrounding where the tick was.  He was able to remove these at home.  I do not see any discrete target lesions however he does have the surrounding erythema to the area.  No fluctuance or induration.  I do not see any evidence of retained particles of tick.   Patient denies any difficulty breathing or swallowing. Pt has a patent airway without stridor and is handling secretions without difficulty; no angioedema. No blisters, no pustules, no warmth, no draining sinus tracts, no superficial abscesses, no bullous  impetigo, no vesicles, no desquamation, central bulla. Not tender to touch. No concern for superimposed infection. No concern for SJS, TEN, TSS, syphilis or other life-threatening condition.    Given erythema surrounding tick bites with prolonged exposure to to text will treat for Lyme disease, obtain labs none follow-up outpatient.  The patient has been appropriately medically screened and/or stabilized in the ED. I have low suspicion for any other emergent medical condition which would require further screening, evaluation or treatment in the  ED or require inpatient management.  Patient is hemodynamically stable and in no acute distress.  Patient able to ambulate in department prior to ED.  Evaluation does not show acute pathology that would require ongoing or additional emergent interventions while in the emergency department or further inpatient treatment.  I have discussed the diagnosis with the patient and answered all questions.  Pain is been managed while in the emergency department and patient has no further complaints prior to discharge.  Patient is comfortable with plan discussed in room and is stable for discharge at this time.  I have discussed strict return precautions for returning to the emergency department.  Patient was encouraged to follow-up with PCP/specialist refer to at discharge.    MDM Rules/Calculators/A&P                       Final Clinical Impression(s) / ED Diagnoses Final diagnoses:  Tick bite, initial encounter    Rx / DC Orders ED Discharge Orders         Ordered    doxycycline (VIBRAMYCIN) 100 MG capsule  2 times daily     05/19/20 2156           Christiann Hagerty A, PA-C 05/19/20 2157    Sabas Sous, MD 05/19/20 2333

## 2020-05-27 LAB — LYME DISEASE, WESTERN BLOT
IgG P18 Ab.: ABSENT
IgG P23 Ab.: ABSENT
IgG P28 Ab.: ABSENT
IgG P30 Ab.: ABSENT
IgG P41 Ab.: ABSENT
IgG P45 Ab.: ABSENT
IgG P58 Ab.: ABSENT
IgG P66 Ab.: ABSENT
IgG P93 Ab.: ABSENT
IgM P23 Ab.: ABSENT
IgM P39 Ab.: ABSENT
IgM P41 Ab.: ABSENT
Lyme IgG Wb: NEGATIVE
Lyme IgM Wb: NEGATIVE

## 2021-01-21 ENCOUNTER — Other Ambulatory Visit: Payer: Self-pay

## 2021-01-21 ENCOUNTER — Ambulatory Visit (INDEPENDENT_AMBULATORY_CARE_PROVIDER_SITE_OTHER): Payer: 59 | Admitting: Family

## 2021-01-21 ENCOUNTER — Encounter: Payer: Self-pay | Admitting: Family

## 2021-01-21 VITALS — BP 150/79 | HR 75 | Ht 71.42 in | Wt 213.8 lb

## 2021-01-21 DIAGNOSIS — L299 Pruritus, unspecified: Secondary | ICD-10-CM | POA: Diagnosis not present

## 2021-01-21 DIAGNOSIS — G4721 Circadian rhythm sleep disorder, delayed sleep phase type: Secondary | ICD-10-CM | POA: Insufficient documentation

## 2021-01-21 DIAGNOSIS — I10 Essential (primary) hypertension: Secondary | ICD-10-CM

## 2021-01-21 DIAGNOSIS — E1165 Type 2 diabetes mellitus with hyperglycemia: Secondary | ICD-10-CM | POA: Diagnosis not present

## 2021-01-21 DIAGNOSIS — Z7689 Persons encountering health services in other specified circumstances: Secondary | ICD-10-CM | POA: Diagnosis not present

## 2021-01-21 DIAGNOSIS — Z0100 Encounter for examination of eyes and vision without abnormal findings: Secondary | ICD-10-CM

## 2021-01-21 MED ORDER — TRIAMCINOLONE ACETONIDE 0.1 % EX CREA: 1.0000 "application " | TOPICAL_CREAM | Freq: Two times a day (BID) | CUTANEOUS | 0 refills | Status: DC

## 2021-01-21 NOTE — Progress Notes (Signed)
Subjective:    Alan Reeves - 65 y.o. male MRN [id number]  Date of birth: May 08, 1956  HPI  Alan Reeves is to establish care. Patient has a PMH significant for essential hypertension, diabetes mellitus without complication, type 2 diabetes mellitus with hyperglycemia, hyperlipidemia, and shoulder impingement syndrome.   Current issues and/or concerns: 1. DIABETES FOLLOW-UP: 02/20/2020 at [facility] Endocrinology per PA note: Alan Reeves returns for f/u of Type II DM with HTN, hyperlipidemia and overweight.  He has been fair since his appointment in November 2020.  Has new insurance and was told [place] was covered, but found out today that it is not. He has started back on his pioglitazone since his last visit.  Plans to get COVID vaccine once he is eligible.   Diabetes: Taking metformin 1000 mg BID, glipizide 10 mg BID and pioglitazone 45mg  1 tab daily.   Previously on Invokana, but Invokana was too expensive.  It did seem to work well.  Also unable to take or Jardiance due to cost.   BG are reviewed in his glucometer. Checking 1 time/day.  Average BG of 167. Denies hypoglycemia. A1C was 7.4% today, down from 9.1% previously.   Plan: Diabetes: A1C is close to goal of < 7.0%.  Continue metformin and glipizide.  Continue pioglitazone 45 mg daily.  Work on diet and exercise.  Check BG 1-2 times/day and bring meter to future appointments.  Call if he has problems with hypoglycemia.   01/21/2021: Last A1C: 7.4% on 02/20/2020 Med Adherence:  [x]  Yes    []  No Medication side effects:  []  Yes    [x]  No Home Monitoring?  [x]  Yes    []  No Home glucose results range: 169 is the weekly average for the last 7 days Diet Adherence: []  Yes    [x]  No Exercise: []  Yes    [x]  No Hypoglycemic episodes?: []  Yes    [x]  No Today would like to know if he can discontinue all diabetic pills and begin Ozempic.  2. HYPERTENSION FOLLOW-UP: 02/20/2020 visit at [facility] Endocrinology per PA note: HTN: Reports compliance with BP meds, on an ACEi.  Usually eats 3 meal per day and consumes some sodas and diet juice.  Sometimes walks his dog for exercise.   Plan: Blood pressure well controlled.  Continue ACEi.  01/21/2021: 3 months  Currently taking: see medication list  Have you taken your blood pressure medication today: []  Yes [x]  No  Med Adherence: []  Yes    [x]  No, has been 3 months without blood pressure medications Medication side effects: []  Yes    [x]  No Adherence with salt restriction (low-salt diet): []  Yes    [x]  No Exercise: Yes []  No [x]  Home Monitoring?: []  Yes    [x]  No Monitoring Frequency: []  Yes    [x]  No Home BP results range: []  Yes    [x]  No Smoking []  Yes [x]  No SOB? [x]  Yes, related to anxiety  Chest Pain?: []  Yes    [x]  No Leg swelling?: []  Yes    [x]  No Headaches?: []  Yes    [x]  No Dizziness? []  Yes    [x]  No  ROS per HPI    Health Maintenance:  Health Maintenance Due  Topic Date Due  . Hepatitis C Screening  Never done  . OPHTHALMOLOGY EXAM  Never done  . HIV Screening  Never done  . COLONOSCOPY (Pts 45-64yrs Insurance coverage will need to be  confirmed)  Never done  . HEMOGLOBIN A1C  12/31/2014  . FOOT EXAM  04/01/2015  . TETANUS/TDAP  12/12/2017  . COVID-19 Vaccine (2 - Pfizer 3-dose series) 05/01/2020    Past Medical History: Patient Active Problem List   Diagnosis Date Noted  . 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 Diabetes mellitus without complication (HCC) 10/08/2017  . Keratoconjunctivitis sicca of both eyes not specified as Sjogren's 10/08/2017  . Long term current use of oral hypoglycemic drug 10/08/2017  . Nuclear sclerotic cataract of both eyes 10/08/2017  . Presbyopia of both eyes 10/08/2017  . Vitreous floaters, bilateral 10/08/2017  . Benign paroxysmal positional vertigo 11/15/2016  . Impacted cerumen of left ear  11/15/2016  . Needs flu shot 11/15/2016  . Hyperlipemia 07/07/2016  . Overweight (BMI 25.0-29.9) 07/07/2016  . Essential hypertension 04/05/2016  . Type 2 diabetes mellitus with hyperglycemia (HCC) 04/05/2016  . Frozen shoulder 09/24/2014  . Shoulder impingement syndrome 09/24/2014    Social History   reports that he has never smoked. He has never used smokeless tobacco. He reports that he does not drink alcohol and does not use drugs.   Family History  family history includes Cancer in his father; Diabetes in his father; Hypertension in his mother.   Medications: reviewed and updated   Objective:   Physical Exam BP (!) 150/79 (BP Location: Right Arm, Patient Position: Sitting)   Pulse 75   Ht 5' 11.42" (1.814 m)   Wt 213 lb 12.8 oz (97 kg)   SpO2 97%   BMI 29.47 kg/m  Physical Exam HENT:     Head: Normocephalic and atraumatic.  Eyes:     Extraocular Movements: Extraocular movements intact.     Pupils: Pupils are equal, round, and reactive to light.  Cardiovascular:     Rate and Rhythm: Normal rate and regular rhythm.     Pulses: Normal pulses.     Heart sounds: Normal heart sounds.  Pulmonary:     Effort: Pulmonary effort is normal.     Breath sounds: Normal breath sounds.  Musculoskeletal:     Cervical back: Normal range of motion and neck supple.  Neurological:     General: No focal deficit present.     Mental Status: He is alert and oriented to person, place, and time.  Psychiatric:        Mood and Affect: Mood normal.        Behavior: Behavior normal.         Assessment & Plan:  1. Encounter to establish care: - Patient presents today to establish care.  - Return for annual physical examination, labs, and health maintenance. Arrive fasting meaning having had no food and/or nothing to drink for at least 8 hours prior to appointment.  2. Essential hypertension: - Blood pressure not at goal during today's visit. Patient asymptomatic without chest pressure,  chest pain, palpitations, shortness of breath, and worst headache of life. - Patient has been without blood pressure medication for at least 3 months.  - Continue Lisinopril as prescribed.  - Counseled on blood pressure goal of less than 130/80, low-sodium, DASH diet, medication compliance, 150 minutes of moderate intensity exercise per week as tolerated. Discussed medication compliance, adverse effects. - Follow-up within 4 weeks for blood pressure check. Write down your blood pressure readings each day and bring those results along with your home blood pressure monitor to your appointment.  - lisinopril (ZESTRIL) 5 MG tablet; Take 1  tablet (5 mg total) by mouth daily.  Dispense: 90 tablet; Refill: 0  3. Type 2 diabetes mellitus with hyperglycemia, without long-term current use of insulin (HCC): - Hemoglobin A1c 7.4% last obtained 02/20/2020. - Continue Metformin, Glipizide, and Pioglitazone as prescribed.  - Discussed the importance of healthy eating habits, low-carbohydrate diet, low-sugar diet, regular aerobic exercise (at least 150 minutes a week as tolerated) and medication compliance to achieve or maintain control of diabetes. - Follow-up within 4 weeks to recheck hemoglobin A1c and further discussion of adjustment of oral diabetic medications with the possibility of adding Ozempic. Patient agreeable.  - glipiZIDE (GLUCOTROL) 10 MG tablet; Take 1 tablet (10 mg total) by mouth 2 (two) times daily.  Dispense: 60 tablet; Refill: 2 - metFORMIN (GLUCOPHAGE) 1000 MG tablet; Take 1 tablet (1,000 mg total) by mouth 2 (two) times daily.  Dispense: 60 tablet; Refill: 2 - pioglitazone (ACTOS) 45 MG tablet; Take 1 tablet (45 mg total) by mouth daily.  Dispense: 90 tablet; Refill: 0  4. Itching: - Triamcinolone as prescribed.  - triamcinolone (KENALOG) 0.1 %; Apply 1 application topically 2 (two) times daily.  Dispense: 100 g; Refill: 0  5. Eye exam, routine: - Referral to Ophthalmology for routine  diabetic eye examination.  - Ambulatory referral to Ophthalmology   Patient was given clear instructions to go to Emergency Department or return to medical center if symptoms don't improve, worsen, or new problems develop.The patient verbalized understanding.  Ricky Stabs, NP 01/23/2021, 10:45 AM Primary Care at [facility]

## 2021-01-21 NOTE — Progress Notes (Signed)
Establish care Pt wants options to reduce the amount of medications he takes such as Ozempic, Optometrist.Marland Kitchen

## 2021-01-21 NOTE — Patient Instructions (Addendum)
Return for annual physical examination, labs, and health maintenance. Arrive fasting meaning having had no food and/or nothing to drink for at least 8 hours prior to appointment. Continue Metformin, Glipizide, and Pioglitazone for diabetes.  Continue Lisinopril for high blood pressure. Triamcinolone for itching rash.  Referral to Eye Doctor for routine eye exam.  Thank you for choosing Primary Care at [facility] for your medical home!    Alan Reeves was seen by Camillia Herter, NP today.   Andreas Ohm Filyaw's primary care provider is Camillia Herter, NP.   For the best care possible,  you should try to see Durene Fruits, NP whenever you come to clinic.   We look forward to seeing you again soon!  If you have any questions about your visit today,  please call us at [phone]  Or feel free to reach your provider via [place].     Type 2 Diabetes Mellitus, Self-Care, Adult When you have type 2 diabetes (type 2 diabetes mellitus), you must make sure your blood sugar (glucose) stays in a healthy range. You can do this with:  Nutrition.  Exercise.  Lifestyle changes.  Medicines or insulin, if needed.  Support from your doctors and others. What are the risks? Having diabetes can raise your risk for other long-term (chronic) health problems. You may get medicines to help prevent these problems. How to stay aware of blood sugar  Check your blood sugar level every day, as often as told.  Have your A1C (hemoglobin A1C) level checked two or more times a year. Have it checked more often if told.  Your doctor will set personal treatment goals for you. In general, you should have these blood sugar levels: ? Before meals: 80-130 mg/dL (4.4-7.2 mmol/L). ? After meals: below 180 mg/dL (10 mmol/L). ? A1C: less than 7%.   How to manage high and low blood sugar Symptoms of high blood sugar High blood sugar is also called hyperglycemia. Know the symptoms of high blood sugar. These may  include:  More thirst.  Hunger.  Feeling very tired.  Needing to pee (urinate) more often than normal.  Seeing things blurry. Symptoms of low blood sugar Low blood sugar is also called hypoglycemia. This is when blood sugar is at or below 70 mg/dL (3.9 mmol/L). Symptoms may include:  Hunger.  Feeling worried or nervous (anxious).  Feeling sweaty and cold to the touch (clammy).  Being dizzy or light-headed.  Feeling sleepy.  A fast heartbeat.  Feeling grouchy (irritable).  Tingling or loss of feeling (numbness) around your mouth, lips, or tongue.  Restless sleep. Diabetes medicines can cause low blood sugar. You are more at risk:  While you exercise.  After exercise.  During sleep.  When you are sick.  When you skip meals or do not eat for a long time. Treating low blood sugar If you think you have low blood sugar, eat or drink something sugary right away. Keep 15 grams of a fast-acting carb (carbohydrate) with you all the time. Make sure your family and friends know how to treat you if you cannot treat yourself. Treating very low blood sugar Severe hypoglycemia is when your blood sugar is at or below 54 mg/dL (3 mmol/L). Severe hypoglycemia is an emergency. Do not wait to see if the symptoms will go away. Get medical help right away. Call your local emergency services (911 in the [place] Do not drive yourself to the hospital. You may need a glucagon shot if you  have very low blood sugar and you cannot eat or drink. Have a family member or friend learn how to check your blood sugar and how to give you a glucagon shot. Ask your doctor if you should have a kit for glucagon shots. Follow these instructions at home: Medicines  Take diabetes medicines as told. If your doctor prescribed insulin or diabetes medicines, take them each day.  Do not run out of insulin or other medicines. Plan ahead.  If you use insulin, change the amount you take based on how active you are  and what foods you eat. Your doctor will tell you how to do this.  Take over-the-counter and prescription medicines only as told by your doctor. Eating and drinking  Eat healthy foods. These include: ? Low-fat (lean) proteins. ? Complex carbs, such as whole grains. ? Fresh fruits and vegetables. ? Low-fat dairy products. ? Healthy fats.  Meet with a food expert (dietitian) to make an eating plan.  Follow instructions from your doctor about what you cannot eat or drink.  Drink enough fluid to keep your pee (urine) pale yellow.  Keep track of carbs that you eat. Read food labels and learn serving sizes of foods.  Follow your sick-day plan when you cannot eat or drink as normal. Make this plan with your doctor so it is ready to use.   Activity  Exercise as told by your doctor. You may need to: ? Do stretching and strength exercises 2 or more times a week. ? Do 150 minutes or more of exercise each week that makes your heart beat faster and makes you sweat.  Spread out your exercise over 3 or more days a week.  Do not go more than 2 days in a row without exercise.  Talk with your doctor before you start a new exercise. Your doctor may tell you to change: ? How much insulin or medicines you take. ? How much food you eat. Lifestyle  Do not use any products that contain nicotine or tobacco, such as cigarettes, e-cigarettes, and chewing tobacco. If you need help quitting, ask your doctor.  If you drink alcohol and your doctor says alcohol is safe for you: ? Limit how much you use to:  0-1 drink a day for women who are not pregnant.  0-2 drinks a day for men. ? Be aware of how much alcohol is in your drink. In the [place] one drink equals one 12 oz bottle of beer (355 mL), one 5 oz glass of wine (148 mL), or one 1 oz glass of hard liquor (44 mL).  Learn to deal with stress. If you need help, ask your doctor. Body care  Stay up to date with your shots (immunizations).  Have  your eyes and feet checked by a doctor as often as told.  Check your skin and feet every day. Check for cuts, bruises, redness, blisters, or sores.  Brush your teeth and gums two times a day. Floss one or more times a day.  Go to the dentist one or more times every 6 months.  Stay at a healthy weight.   General instructions  Share your diabetes care plan with: ? Your work or school. ? People you live with.  Carry a card or wear jewelry that says you have diabetes.  Keep all follow-up visits as told by your doctor. This is important. Questions to ask your doctor  Do I need to meet with a certified expert in diabetes education and  care?  Where can I find a support group? Where to find more information  American Diabetes Association: www.diabetes.org  American Association of Diabetes Care and Education Specialists: www.diabeteseducator.org  International Diabetes Federation: MemberVerification.ca Summary  When you have type 2 diabetes, you must make sure your blood sugar (glucose) stays in a healthy range. You can do this with nutrition, exercise, medicines and insulin, and support from doctors and others.  Check your blood sugar every day, or as often as told.  Having diabetes can raise your risk for other long-term health problems. You may get medicines to help prevent these problems.  Share your diabetes management plan with people at work, school, and home.  Keep all follow-up visits as told by your doctor. This is important. This information is not intended to replace advice given to you by your health care provider. Make sure you discuss any questions you have with your health care provider. Document Revised: 06/30/2020 Document Reviewed: 06/30/2020 Elsevier Patient Education  [place].

## 2021-01-23 ENCOUNTER — Other Ambulatory Visit: Payer: Self-pay | Admitting: Family

## 2021-01-23 DIAGNOSIS — I10 Essential (primary) hypertension: Secondary | ICD-10-CM

## 2021-01-23 MED ORDER — PIOGLITAZONE HCL 45 MG PO TABS: 45.0000 mg | ORAL_TABLET | Freq: Every day | ORAL | 0 refills | Status: DC

## 2021-01-23 MED ORDER — METFORMIN HCL 1000 MG PO TABS
1000.0000 mg | ORAL_TABLET | Freq: Two times a day (BID) | ORAL | 2 refills | Status: DC
Start: 2021-01-23 — End: 2021-02-18

## 2021-01-23 MED ORDER — LISINOPRIL 5 MG PO TABS: 5.0000 mg | ORAL_TABLET | Freq: Every day | ORAL | 0 refills | Status: DC

## 2021-01-23 MED ORDER — GLIPIZIDE 10 MG PO TABS: 10.0000 mg | ORAL_TABLET | Freq: Two times a day (BID) | ORAL | 2 refills | Status: DC

## 2021-01-24 MED ORDER — AMLODIPINE BESYLATE 5 MG PO TABS: 5.0000 mg | ORAL_TABLET | Freq: Every day | ORAL | 0 refills | Status: DC

## 2021-01-24 NOTE — Telephone Encounter (Signed)
Pharmacy req that Lisinopril be changed

## 2021-01-24 NOTE — Telephone Encounter (Signed)
Please call patient with update.   Lisinopril for high blood pressure discontinued as this is not allowed to be taken with Lithium.   Begin Amlodipine 5 mg daily for high blood pressure. Medication sent to pharmacy on file. Follow-up in 4 weeks or sooner if needed for blood pressure check with PCP.

## 2021-01-24 NOTE — Addendum Note (Signed)
Addended by: Rema Fendt on: 01/24/2021 03:48 PM   Modules accepted: Orders

## 2021-02-17 NOTE — Progress Notes (Signed)
Patient ID: Alan Reeves, male    DOB: [birth year]  MRN: [id number]  CC: Annual Physical Exam  Subjective: Alan Reeves is a 65 y.o. male who presents for annual physical exam.  His concerns today include:   1. HYPERTENSION FOLLOW-UP: 01/21/2021: - Continue Lisinopril as prescribed.  02/18/2021: Reports taking Lisinopril and Amlodipine every other day alternating between the two. He has not taken blood pressure medications today. Also, reports he is feeling anxious related to a family situation.  Currently taking: see medication list Med Adherence: []  Yes    [x]  No Medication side effects: []  Yes    [x]  No Exercise: Yes []  No [x]  Home Monitoring?: []  Yes    [x]  No Monitoring Frequency: []  Yes    [x]  No Home BP results range: []  Yes    [x]  No Smoking []  Yes [x]  No SOB? []  Yes, related to anxiety Chest Pain?: []  Yes    [x]  No Leg swelling?: []  Yes    [x]  No Headaches?: []  Yes    [x]  No Dizziness? []  Yes    [x]  No  2. DIABETES TYPE 2 FOLLOW-UP: 01/21/2021: - Hemoglobin A1c 7.4% last obtained 02/20/2020. - Continue Metformin, Glipizide, and Pioglitazone as prescribed.   02/18/2021: Last A1C: 7.4% on 02/20/2020  Med Adherence:  [x]  Yes    []  No Medication side effects:  []  Yes    [x]  No Home Monitoring?  []  Yes    [x]  No Diet Adherence: []  Yes    [x]  No Exercise: []  Yes    [x]  No  Patient Active Problem List   Diagnosis Date Noted  . 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 Diabetes mellitus without complication ([place]) 98/33/8250  . Keratoconjunctivitis sicca of both eyes not specified as Sjogren's 10/08/2017  . Long term current use of oral hypoglycemic drug 10/08/2017  . Nuclear sclerotic cataract of both eyes 10/08/2017  . Presbyopia of both eyes 10/08/2017  . Vitreous floaters, bilateral 10/08/2017  . Benign paroxysmal positional vertigo 11/15/2016  . Impacted cerumen of left ear 11/15/2016  . Needs flu shot 11/15/2016   . Hyperlipemia 07/07/2016  . Overweight (BMI 25.0-29.9) 07/07/2016  . Essential hypertension 04/05/2016  . Type 2 diabetes mellitus with hyperglycemia ([place]) 04/05/2016  . Frozen shoulder 09/24/2014  . Shoulder impingement syndrome 09/24/2014     Current Outpatient Medications on File Prior to Visit  Medication Sig Dispense Refill  . Accu-Chek FastClix Lancets MISC USE TO CHECK BLOOD SUGARS 1 TO 2 TIMES DAILY.  DX:  E11.65    . lithium carbonate 300 MG capsule Take 300 mg by mouth 2 (two) times daily.    Marland Kitchen triamcinolone (KENALOG) 0.1 % Apply 1 application topically 2 (two) times daily. 100 g 0  . venlafaxine (EFFEXOR) 75 MG tablet Take 75 mg by mouth 2 (two) times daily with a meal.     No current facility-administered medications on file prior to visit.    No Known Allergies  Social History   Socioeconomic History  . Marital status: Divorced    Spouse name: Not on file  . Number of children: Not on file  . Years of education: Not on file  . Highest education level: Not on file  Occupational History  . Not on file  Tobacco Use  . Smoking status: Never Smoker  . Smokeless tobacco: Never Used  Vaping Use  . Vaping Use: Never used  Substance and Sexual Activity  . Alcohol use: No  .  Drug use: No  . Sexual activity: Not on file  Other Topics Concern  . Not on file  Social History Narrative  . Not on file   Social Determinants of Health   Financial Resource Strain: Not on file  Food Insecurity: Not on file  Transportation Needs: Not on file  Physical Activity: Not on file  Stress: Not on file  Social Connections: Not on file  Intimate Partner Violence: Not on file    Family History  Problem Relation Age of Onset  . Hypertension Mother   . Diabetes Father   . Cancer Father     No past surgical history on file.  ROS: Review of Systems Negative except as stated above  PHYSICAL EXAM: BP (!) 144/76 (BP Location: Left Arm, Patient Position: Sitting)   Pulse  83   Ht 5' 11.42" (1.814 m)   Wt 214 lb (97.1 kg)   SpO2 97%   BMI 29.50 kg/m   Physical Exam Exam conducted with a chaperone present.  Constitutional:      Appearance: He is overweight.  HENT:     Head: Normocephalic and atraumatic.     Right Ear: Tympanic membrane, ear canal and external ear normal.     Left Ear: Tympanic membrane, ear canal and external ear normal.     Nose: Nose normal.     Mouth/Throat:     Mouth: Mucous membranes are moist.     Pharynx: Oropharynx is clear.  Eyes:     Extraocular Movements: Extraocular movements intact.     Conjunctiva/sclera: Conjunctivae normal.     Pupils: Pupils are equal, round, and reactive to light.  Cardiovascular:     Rate and Rhythm: Normal rate and regular rhythm.     Pulses: Normal pulses.     Heart sounds: Normal heart sounds.  Pulmonary:     Effort: Pulmonary effort is normal.     Breath sounds: Normal breath sounds.  Abdominal:     General: Bowel sounds are normal.     Palpations: Abdomen is soft.  Genitourinary:    Penis: Normal.      Testes: Normal.     Comments: Alan Reeves, CMA present during examination.  Musculoskeletal:        General: Normal range of motion.     Cervical back: Normal range of motion and neck supple.  Skin:    General: Skin is warm and dry.     Capillary Refill: Capillary refill takes less than 2 seconds.  Neurological:     General: No focal deficit present.     Mental Status: He is alert and oriented to person, place, and time.  Psychiatric:        Mood and Affect: Mood normal.        Behavior: Behavior normal.    ASSESSMENT AND PLAN: 1. Annual physical exam: - Counseled on 150 minutes of exercise per week as tolerated, healthy eating (including decreased daily intake of saturated fats, cholesterol, added sugars, sodium), STI prevention, and routine healthcare maintenance.  2. Screening for metabolic disorder: - CMP to check kidney function, liver function, and electrolyte  balance.  - Comprehensive metabolic panel  3. Screening for deficiency anemia: - CBC to screen for anemia. - CBC  4. Screening cholesterol level: - Lipid panel to screen for high cholesterol.  - Lipid panel  5. Thyroid disorder screen: - TSH to check thyroid function.  - TSH+T4F+T3Free  6. Need for hepatitis C screening test: - HCV antibody to screen  for hepatitis C.  - HCV Ab w/Rflx to Verification  7. Encounter for screening for HIV: - HIV antibody to screen for human immunodeficiency virus.  - HIV antibody (with reflex)  8. Colon cancer screening: - Referral to Gastroenterology to screen for colon cancer by colonoscopy. - Ambulatory referral to Gastroenterology  9. Essential hypertension: - Blood pressure not at goal during today's visit. Patient asymptomatic without chest pressure, chest pain, palpitations, shortness of breath, and worst headache of life. - Reports he did not take blood pressure medications today and feeling anxious related to a family situation. - Continue Lisinopril and Amlodipine as prescribed. Counseled to take both medications daily and to not alternate between the two medications. Patient verbalized understanding. - Counseled on blood pressure goal of less than 130/80, low-sodium, DASH diet, medication compliance, 150 minutes of moderate intensity exercise per week as tolerated. Discussed medication compliance, adverse effects. - Follow-up with primary provider in 1 month if home blood pressure readings are > 130/80. Otherwise follow-up within 3 months or sooner if needed. Counseled to take blood pressure medications prior to appointment on next visit. Patient verbalized understanding. - lisinopril (ZESTRIL) 5 MG tablet; Take 1 tablet (5 mg total) by mouth daily.  Dispense: 90 tablet; Refill: 0 - amLODipine (NORVASC) 5 MG tablet; Take 1 tablet (5 mg total) by mouth daily.  Dispense: 90 tablet; Refill: 0  10. Type 2 diabetes mellitus with hyperglycemia,  without long-term current use of insulin ([place]): -  Hemoglobin A1c to screen level of diabetes control. Previous hemoglobin A1c 7.4% on 02/20/2020. - Microalbumin creatinine urine ratio to check kidney function.  - Continue Metformin, Pioglitazone, and Glipizide as prescribed.  - Discussed the importance of healthy eating habits, low-carbohydrate diet, low-sugar diet, regular aerobic exercise (at least 150 minutes a week as tolerated) and medication compliance to achieve or maintain control of diabetes. - To achieve an A1C goal of less than or equal to 7.0 percent, a fasting blood sugar of 80 to 130 mg/dL and a postprandial glucose (90 to 120 minutes after a meal) less than 180 mg/dL. In the event of sugars less than 60 mg/dl or greater than 400 mg/dl please notify the clinic ASAP. It is recommended that you undergo annual eye exams and annual foot exams.  - Follow-up with primary provider as scheduled. - Hemoglobin A1c - Microalbumin / creatinine urine ratio - metFORMIN (GLUCOPHAGE) 1000 MG tablet; Take 1 tablet (1,000 mg total) by mouth 2 (two) times daily.  Dispense: 60 tablet; Refill: 2 - pioglitazone (ACTOS) 45 MG tablet; Take 1 tablet (45 mg total) by mouth daily.  Dispense: 90 tablet; Refill: 0 - glipiZIDE (GLUCOTROL) 10 MG tablet; Take 1 tablet (10 mg total) by mouth 2 (two) times daily.  Dispense: 60 tablet; Refill: 2 - Blood Glucose Monitoring Suppl (TRUE METRIX METER) w/Device KIT; Use as directed  Dispense: 1 kit; Refill: 0 - glucose blood (TRUE METRIX BLOOD GLUCOSE TEST) test strip; Use as instructed  Dispense: 100 each; Refill: 12 - TRUEplus Lancets 28G MISC; Use as directed  Dispense: 100 each; Refill: 4     Patient was given the opportunity to ask questions.  Patient verbalized understanding of the plan and was able to repeat key elements of the plan. Patient was given clear instructions to go to Emergency Department or return to medical center if symptoms don't improve, worsen, or  new problems develop.The patient verbalized understanding.    Orders Placed This Encounter  Procedures  . Comprehensive metabolic panel  .  Lipid panel  . TSH+T4F+T3Free  . CBC  . Hemoglobin A1c  . HCV Ab w/Rflx to Verification  . Microalbumin / creatinine urine ratio  . Ambulatory referral to Gastroenterology     Requested Prescriptions   Signed Prescriptions Disp Refills  . lisinopril (ZESTRIL) 5 MG tablet 90 tablet 0    Sig: Take 1 tablet (5 mg total) by mouth daily.  Marland Kitchen amLODipine (NORVASC) 5 MG tablet 90 tablet 0    Sig: Take 1 tablet (5 mg total) by mouth daily.  . metFORMIN (GLUCOPHAGE) 1000 MG tablet 60 tablet 2    Sig: Take 1 tablet (1,000 mg total) by mouth 2 (two) times daily.  . pioglitazone (ACTOS) 45 MG tablet 90 tablet 0    Sig: Take 1 tablet (45 mg total) by mouth daily.  Marland Kitchen glipiZIDE (GLUCOTROL) 10 MG tablet 60 tablet 2    Sig: Take 1 tablet (10 mg total) by mouth 2 (two) times daily.    Return in about 3 months (around 05/21/2021) for Follow-Up hypertension and diabetes.  Camillia Herter, NP

## 2021-02-18 ENCOUNTER — Other Ambulatory Visit: Payer: Self-pay

## 2021-02-18 ENCOUNTER — Ambulatory Visit (INDEPENDENT_AMBULATORY_CARE_PROVIDER_SITE_OTHER): Payer: 59 | Admitting: Family

## 2021-02-18 VITALS — BP 144/76 | HR 83 | Ht 71.42 in | Wt 214.0 lb

## 2021-02-18 DIAGNOSIS — Z0000 Encounter for general adult medical examination without abnormal findings: Secondary | ICD-10-CM | POA: Diagnosis not present

## 2021-02-18 DIAGNOSIS — Z1159 Encounter for screening for other viral diseases: Secondary | ICD-10-CM

## 2021-02-18 DIAGNOSIS — Z13228 Encounter for screening for other metabolic disorders: Secondary | ICD-10-CM

## 2021-02-18 DIAGNOSIS — Z114 Encounter for screening for human immunodeficiency virus [HIV]: Secondary | ICD-10-CM

## 2021-02-18 DIAGNOSIS — E1165 Type 2 diabetes mellitus with hyperglycemia: Secondary | ICD-10-CM

## 2021-02-18 DIAGNOSIS — Z130 Encounter for screening for diseases of the blood and blood-forming organs and certain disorders involving the immune mechanism: Secondary | ICD-10-CM | POA: Diagnosis not present

## 2021-02-18 DIAGNOSIS — Z1211 Encounter for screening for malignant neoplasm of colon: Secondary | ICD-10-CM

## 2021-02-18 DIAGNOSIS — E782 Mixed hyperlipidemia: Secondary | ICD-10-CM

## 2021-02-18 DIAGNOSIS — Z13220 Encounter for screening for lipoid disorders: Secondary | ICD-10-CM | POA: Diagnosis not present

## 2021-02-18 DIAGNOSIS — Z1329 Encounter for screening for other suspected endocrine disorder: Secondary | ICD-10-CM

## 2021-02-18 DIAGNOSIS — Z131 Encounter for screening for diabetes mellitus: Secondary | ICD-10-CM

## 2021-02-18 DIAGNOSIS — I10 Essential (primary) hypertension: Secondary | ICD-10-CM

## 2021-02-18 MED ORDER — PIOGLITAZONE HCL 45 MG PO TABS: 45.0000 mg | ORAL_TABLET | Freq: Every day | ORAL | 0 refills | Status: DC

## 2021-02-18 MED ORDER — AMLODIPINE BESYLATE 5 MG PO TABS: 5.0000 mg | ORAL_TABLET | Freq: Every day | ORAL | 0 refills | Status: DC

## 2021-02-18 MED ORDER — METFORMIN HCL 1000 MG PO TABS: 1000.0000 mg | ORAL_TABLET | Freq: Two times a day (BID) | ORAL | 2 refills | Status: DC

## 2021-02-18 MED ORDER — GLIPIZIDE 10 MG PO TABS: 10.0000 mg | ORAL_TABLET | Freq: Two times a day (BID) | ORAL | 2 refills | Status: DC

## 2021-02-18 MED ORDER — LISINOPRIL 5 MG PO TABS: 5.0000 mg | ORAL_TABLET | Freq: Every day | ORAL | 0 refills | Status: DC

## 2021-02-18 NOTE — Patient Instructions (Addendum)
Annual physical exam and labs today.   Continue Lisinopril and Amlodipine for high blood pressure.   Continue Metformin, Glipizide, and Pioglitazone for diabetes.   Follow-up for high blood pressure and diabetes in 3 months or sooner if needed.   Preventive Care 74-64 Years Old, Male Preventive care refers to lifestyle choices and visits with your health care provider that can promote health and wellness. This includes:  A yearly physical exam. This is also called an annual wellness visit.  Regular dental and eye exams.  Immunizations.  Screening for certain conditions.  Healthy lifestyle choices, such as: ? Eating a healthy diet. ? Getting regular exercise. ? Not using drugs or products that contain nicotine and tobacco. ? Limiting alcohol use. What can I expect for my preventive care visit? Physical exam Your health care provider will check your:  Height and weight. These may be used to calculate your BMI (body mass index). BMI is a measurement that tells if you are at a healthy weight.  Heart rate and blood pressure.  Body temperature.  Skin for abnormal spots. Counseling Your health care provider may ask you questions about your:  Past medical problems.  Family's medical history.  Alcohol, tobacco, and drug use.  Emotional well-being.  Home life and relationship well-being.  Sexual activity.  Diet, exercise, and sleep habits.  Work and work Astronomer.  Access to firearms. What immunizations do I need? Vaccines are usually given at various ages, according to a schedule. Your health care provider will recommend vaccines for you based on your age, medical history, and lifestyle or other factors, such as travel or where you work.   What tests do I need? Blood tests  Lipid and cholesterol levels. These may be checked every 5 years, or more often if you are over 20 years old.  Hepatitis C test.  Hepatitis B test. Screening  Lung cancer screening.  You may have this screening every year starting at age 35 if you have a 30-pack-year history of smoking and currently smoke or have quit within the past 15 years.  Prostate cancer screening. Recommendations will vary depending on your family history and other risks.  Genital exam to check for testicular cancer or hernias.  Colorectal cancer screening. ? All adults should have this screening starting at age 59 and continuing until age 7. ? Your health care provider may recommend screening at age 58 if you are at increased risk. ? You will have tests every 1-10 years, depending on your results and the type of screening test.  Diabetes screening. ? This is done by checking your blood sugar (glucose) after you have not eaten for a while (fasting). ? You may have this done every 1-3 years.  STD (sexually transmitted disease) testing, if you are at risk. Follow these instructions at home: Eating and drinking  Eat a diet that includes fresh fruits and vegetables, whole grains, lean protein, and low-fat dairy products.  Take vitamin and mineral supplements as recommended by your health care provider.  Do not drink alcohol if your health care provider tells you not to drink.  If you drink alcohol: ? Limit how much you have to 0-2 drinks a day. ? Be aware of how much alcohol is in your drink. In the [place] one drink equals one 12 oz bottle of beer (355 mL), one 5 oz glass of wine (148 mL), or one 1 oz glass of hard liquor (44 mL).   Lifestyle  Take daily care of  your teeth and gums. Brush your teeth every morning and night with fluoride toothpaste. Floss one time each day.  Stay active. Exercise for at least 30 minutes 5 or more days each week.  Do not use any products that contain nicotine or tobacco, such as cigarettes, e-cigarettes, and chewing tobacco. If you need help quitting, ask your health care provider.  Do not use drugs.  If you are sexually active, practice safe sex. Use a  condom or other form of protection to prevent STIs (sexually transmitted infections).  If told by your health care provider, take low-dose aspirin daily starting at age 46.  Find healthy ways to cope with stress, such as: ? Meditation, yoga, or listening to music. ? Journaling. ? Talking to a trusted person. ? Spending time with friends and family. Safety  Always wear your seat belt while driving or riding in a vehicle.  Do not drive: ? If you have been drinking alcohol. Do not ride with someone who has been drinking. ? When you are tired or distracted. ? While texting.  Wear a helmet and other protective equipment during sports activities.  If you have firearms in your house, make sure you follow all gun safety procedures. What's next?  Go to your health care provider once a year for an annual wellness visit.  Ask your health care provider how often you should have your eyes and teeth checked.  Stay up to date on all vaccines. This information is not intended to replace advice given to you by your health care provider. Make sure you discuss any questions you have with your health care provider. Document Revised: 08/26/2019 Document Reviewed: 11/21/2018 Elsevier Patient Education  2021 [employer].

## 2021-02-18 NOTE — Progress Notes (Signed)
physical

## 2021-02-19 MED ORDER — TRUE METRIX METER W/DEVICE KIT: PACK | 0 refills | Status: DC

## 2021-02-19 MED ORDER — TRUEPLUS LANCETS 28G MISC: 4 refills | Status: DC

## 2021-02-19 MED ORDER — TRUE METRIX BLOOD GLUCOSE TEST VI STRP
ORAL_STRIP | 12 refills | Status: AC
Start: 2021-02-19 — End: ?

## 2021-02-21 LAB — HCV AB W/RFLX TO VERIFICATION: HCV Ab: <0.1 s/co ratio (ref 0.0–0.9)

## 2021-02-21 LAB — HEMOGLOBIN A1C
Est. average glucose Bld gHb Est-mCnc: 194 mg/dL
Hgb A1c MFr Bld: 8.4 % — ABNORMAL HIGH (ref 4.8–5.6)

## 2021-02-21 LAB — CBC
Hematocrit: 42.9 % (ref 37.5–51.0)
Hemoglobin: 14 g/dL (ref 13.0–17.7)
MCH: 28.7 pg (ref 26.6–33.0)
MCHC: 32.6 g/dL (ref 31.5–35.7)
MCV: 88 fL (ref 79–97)
Platelets: 333 10*3/uL (ref 150–450)
RBC: 4.88 x10E6/uL (ref 4.14–5.80)
RDW: 13 % (ref 11.6–15.4)
WBC: 7.2 10*3/uL (ref 3.4–10.8)

## 2021-02-21 LAB — COMPREHENSIVE METABOLIC PANEL
ALT: 34 IU/L (ref 0–44)
AST: 25 IU/L (ref 0–40)
Albumin/Globulin Ratio: 1.6 (ref 1.2–2.2)
Albumin: 4.4 g/dL (ref 3.8–4.8)
Alkaline Phosphatase: 101 IU/L (ref 44–121)
BUN/Creatinine Ratio: 12 (ref 10–24)
BUN: 16 mg/dL (ref 8–27)
Bilirubin Total: 0.4 mg/dL (ref 0.0–1.2)
CO2: 18 mmol/L — ABNORMAL LOW (ref 20–29)
Calcium: 9.5 mg/dL (ref 8.6–10.2)
Chloride: 102 mmol/L (ref 96–106)
Creatinine, Ser: 1.29 mg/dL — ABNORMAL HIGH (ref 0.76–1.27)
Globulin, Total: 2.7 g/dL (ref 1.5–4.5)
Glucose: 170 mg/dL — ABNORMAL HIGH (ref 65–99)
Potassium: 4.4 mmol/L (ref 3.5–5.2)
Sodium: 137 mmol/L (ref 134–144)
Total Protein: 7.1 g/dL (ref 6.0–8.5)
eGFR: 62 mL/min/{1.73_m2} (ref >59)

## 2021-02-21 LAB — LIPID PANEL
Chol/HDL Ratio: 5.2 ratio — ABNORMAL HIGH (ref 0.0–5.0)
Cholesterol, Total: 181 mg/dL (ref 100–199)
HDL: 35 mg/dL — ABNORMAL LOW (ref >39)
LDL Chol Calc (NIH): 100 mg/dL — ABNORMAL HIGH (ref 0–99)
Triglycerides: 271 mg/dL — ABNORMAL HIGH (ref 0–149)
VLDL Cholesterol Cal: 46 mg/dL — ABNORMAL HIGH (ref 5–40)

## 2021-02-21 LAB — SPECIMEN STATUS REPORT

## 2021-02-21 LAB — HCV INTERPRETATION

## 2021-02-21 LAB — MICROALBUMIN / CREATININE URINE RATIO

## 2021-02-21 LAB — TSH+T4F+T3FREE
Free T4: 1.38 ng/dL (ref 0.82–1.77)
T3, Free: 3.3 pg/mL (ref 2.0–4.4)
TSH: 0.831 u[IU]/mL (ref 0.450–4.500)

## 2021-02-22 DIAGNOSIS — E782 Mixed hyperlipidemia: Secondary | ICD-10-CM | POA: Insufficient documentation

## 2021-02-22 MED ORDER — ROSUVASTATIN CALCIUM 20 MG PO TABS: 20.0000 mg | ORAL_TABLET | Freq: Every day | ORAL | 0 refills | Status: DC

## 2021-02-22 MED ORDER — DAPAGLIFLOZIN PROPANEDIOL 5 MG PO TABS: 5.0000 mg | ORAL_TABLET | Freq: Every day | ORAL | 1 refills | Status: DC

## 2021-02-22 NOTE — Progress Notes (Signed)
Kidney function normal.   Liver function normal.   Thyroid normal.   No anemia.   Hepatitis C negative.   Microalbumin/creatinine urine ratio will need to be collected again as the initial specimen was not able to read. Please schedule patient lab only visit to have this completed.   Your hemoglobin A1c is 8.4%. This is increased from previous hemoglobin A1c of 7.7%. Continue Metformin and Glipizide. We will discontinue Actos and begin [place] (medication sent to preferred pharmacy.) Follow-up in 4 weeks with clinical pharmacist Franky Macho at [facility] for diabetes/meter check. Please schedule this visit while patient is on the phone.  Cholesterol higher than expected. High cholesterol may increase risk of heart attack and/or stroke. Consider eating more fruits, vegetables, and lean baked meats such as chicken or fish. Moderate intensity exercise at least 150 minutes as tolerated per week may help as well. Will begin Rosuvastatin (Crestor) for high cholesterol. Patient encouraged to schedule lab only visit to recheck cholesterol in about 6 weeks.

## 2021-02-22 NOTE — Addendum Note (Signed)
Addended by: Rema Fendt on: 02/22/2021 01:43 PM   Modules accepted: Orders

## 2021-02-22 NOTE — Addendum Note (Signed)
Addended by: Rema Fendt on: 02/22/2021 01:22 PM   Modules accepted: Orders

## 2021-02-28 ENCOUNTER — Telehealth: Payer: Self-pay | Admitting: Family

## 2021-02-28 ENCOUNTER — Other Ambulatory Visit: Payer: Self-pay

## 2021-02-28 DIAGNOSIS — E1165 Type 2 diabetes mellitus with hyperglycemia: Secondary | ICD-10-CM

## 2021-02-28 NOTE — Telephone Encounter (Signed)
Pt returned call for lab results.

## 2021-02-28 NOTE — Progress Notes (Signed)
microalbumin ordered.

## 2021-03-02 LAB — MICROALBUMIN / CREATININE URINE RATIO
Creatinine, Urine: 151.7 mg/dL
Microalb/Creat Ratio: 18 mg/g creat (ref 0–29)
Microalbumin, Urine: 26.8 ug/mL

## 2021-03-02 NOTE — Progress Notes (Signed)
Kidney function normal

## 2021-03-09 ENCOUNTER — Other Ambulatory Visit: Payer: Self-pay | Admitting: Family

## 2021-03-09 DIAGNOSIS — I10 Essential (primary) hypertension: Secondary | ICD-10-CM

## 2021-03-11 ENCOUNTER — Ambulatory Visit: Payer: 59 | Admitting: Family

## 2021-03-17 ENCOUNTER — Telehealth: Payer: Self-pay | Admitting: Family

## 2021-03-17 NOTE — Telephone Encounter (Signed)
Called patient and LVM advising that I was calling from [facility] in regards to his appointment for tomorrow afternoon at 4pm. Advised him that unfortunately the provider would not be in the office and we need to get his appointment rescheduled. Advised patient to call [phone] to reschedule appt with clinical pharmacist.

## 2021-03-18 ENCOUNTER — Ambulatory Visit: Payer: 59 | Admitting: Pharmacist

## 2021-03-22 NOTE — Progress Notes (Unsigned)
S:     PCP: Ricky Stabs PMH: T2DM, HTN, HLD  Patient arrives in good spirits. Presents to the clinic for hypertension evaluation, counseling, and management.  Patient was referred and last seen by Primary Care Provider on 02/18/21. At that visit A1c increased from 7.7% to 8.4%. Pioglitazone was discontinued and Farxiga 5 mg was initiated. Additionally, rosuvastatin 20 mg daily was initiated for lipid management.  Today, patient reports ***  A1C = 8.4% Check Clinic BG Review medications and adherence (timing of meds, etc.)  Ate or drank anything prior to visit today? At home BGs?  Marland Kitchen Highs . Lows  Hyperglycemia sx (nocturia, neuropathy, visual changes, foot exams) Hypoglycemia symptoms (dizziness, shaky, sweating, hungry, confusion) Diet Exercise  Plan: 1. Increase farxiga to 10 mg daily 2. Start GLP and discontinue glipizide   Family/Social History:  -Fhx: HTN in mother; DM and cancer in father -Tobacco use: denies  Insurance coverage/medication affordability: [employer]  Medication adherence reported *** .   Current diabetes medications include: Farxiga 5 mg daily, glipizide 10 mg daily, metformin 1000 mg BID Current hypertension medications include: amlodipine 5 mg daily, lisinopril 5 mg daily Current hyperlipidemia medications include: rosuvastatin 20 mg daily  Patient {Actions; denies-reports:120008} hypoglycemic events.  Patient reported dietary habits: Eats *** meals/day Breakfast:*** Lunch:*** Dinner:*** Snacks:*** Drinks:***  Patient-reported exercise habits: ***   Patient {Actions; denies-reports:120008} nocturia (nighttime urination).  Patient {Actions; denies-reports:120008} neuropathy (nerve pain). Patient {Actions; denies-reports:120008} visual changes. Patient {Actions; denies-reports:120008} self foot exams.     O:  POCT:  Home fasting blood sugars: ***  2 hour post-meal/random blood sugars: ***.  Lab Results  Component Value Date    HGBA1C 8.4 (H) 02/18/2021   There were no vitals filed for this visit.  Lipid Panel     Component Value Date/Time   CHOL 181 02/18/2021 1529   TRIG 271 (H) 02/18/2021 1529   HDL 35 (L) 02/18/2021 1529   CHOLHDL 5.2 (H) 02/18/2021 1529   CHOLHDL 4.8 03/31/2014 1609   VLDL 73 (H) 03/31/2014 1609   LDLCALC 100 (H) 02/18/2021 1529   Clinical Atherosclerotic Cardiovascular Disease (ASCVD): No  The 10-year ASCVD risk score Denman George DC Jr., et al., 2013) is: 31.5%   Values used to calculate the score:     Age: 2 years     Sex: Male     Is Non-Hispanic African American: No     Diabetic: Yes     Tobacco smoker: No     Systolic Blood Pressure: 144 mmHg     Is BP treated: Yes     HDL Cholesterol: 35 mg/dL     Total Cholesterol: 181 mg/dL    A/P: Diabetes longstanding*** currently ***. Patient is *** able to verbalize appropriate hypoglycemia management plan. Medication adherence appears ***. Control is suboptimal due to ***. - -Extensively discussed pathophysiology of diabetes, recommended lifestyle interventions, dietary effects on blood sugar control -Counseled on s/sx of and management of hypoglycemia -Next A1C anticipated ***.   ASCVD risk - primary prevention in patient with diabetes. Last LDL is not controlled. ASCVD risk score is >20%  - high intensity statin indicated. Pt recently started on rosuvastatin in March.   -Continued rosuvastatin 20 mg daily -Recommend follow-up lipid panel at next PCP visit  Written patient instructions provided.  Total time in face to face counseling *** minutes.   Follow up Pharmacist/PCP*** Clinic Visit in ***.    Fabio Neighbors, PharmD, BCPS PGY2 Ambulatory Care Resident [facility]

## 2021-03-23 ENCOUNTER — Ambulatory Visit: Payer: 59 | Admitting: Pharmacist

## 2021-03-25 ENCOUNTER — Ambulatory Visit: Payer: 59 | Admitting: Family

## 2021-03-27 NOTE — Progress Notes (Signed)
Patient ID: Alan Reeves, male    DOB: Apr 13, 1956  MRN: [id number]  CC: Hypertension Follow-Up  Subjective: Alan Reeves is a 65 y.o. male who presents for hypertension follow-up. His concerns today include:   1. HYPERTENSION FOLLOW-UP: 02/18/2021: - Continue Lisinopril and Amlodipine as prescribed. Counseled to take both medications daily and to not alternate between the two medications. Patient verbalized understanding.  03/28/2021: Currently taking: see medication list Med Adherence: [x]  Yes    []  No Medication side effects: []  Yes    [x]  No Home Monitoring?: []  Yes    [x]  No Smoking []  Yes [x]  No SOB? [x]  Yes, related to anxiety Chest Pain?: []  Yes    [x]  No Leg swelling?: []  Yes    [x]  No Headaches?: []  Yes    [x]  No Dizziness? []  Yes    [x]  No  2. HYPERLIPIDEMIA FOLLOW-UP: Med Adherence: []  Yes    [x]  No Medication side effects: []  Yes    []  No Muscle aches:  []  Yes    []  No Diet Adherence: []  Yes    []  No Comments:  Reports did not begin recommended Rosuvastatin since last visit because was unsure if he was supposed to take medication.    Patient Active Problem List   Diagnosis Date Noted  . Mixed dyslipidemia 02/22/2021  . 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 Diabetes mellitus without complication ([place]) 25/63/8937  . Keratoconjunctivitis sicca of both eyes not specified as Sjogren's 10/08/2017  . Long term current use of oral hypoglycemic drug 10/08/2017  . Nuclear sclerotic cataract of both eyes 10/08/2017  . Presbyopia of both eyes 10/08/2017  . Vitreous floaters, bilateral 10/08/2017  . Benign paroxysmal positional vertigo 11/15/2016  . Impacted cerumen of left ear 11/15/2016  . Needs flu shot 11/15/2016  . Hyperlipemia 07/07/2016  . Overweight (BMI 25.0-29.9) 07/07/2016  . Essential hypertension 04/05/2016  . Type 2 diabetes mellitus with hyperglycemia ([place]) 04/05/2016  . Frozen shoulder 09/24/2014  .  Shoulder impingement syndrome 09/24/2014     Current Outpatient Medications on File Prior to Visit  Medication Sig Dispense Refill  . amLODipine (NORVASC) 5 MG tablet Take 1 tablet (5 mg total) by mouth daily. 90 tablet 0  . Blood Glucose Monitoring Suppl (TRUE METRIX METER) w/Device KIT Use as directed 1 kit 0  . dapagliflozin propanediol (FARXIGA) 5 MG TABS tablet Take 1 tablet (5 mg total) by mouth daily. 30 tablet 1  . glipiZIDE (GLUCOTROL) 10 MG tablet Take 1 tablet (10 mg total) by mouth 2 (two) times daily. 60 tablet 2  . glucose blood (TRUE METRIX BLOOD GLUCOSE TEST) test strip Use as instructed 100 each 12  . lisinopril (ZESTRIL) 5 MG tablet Take 1 tablet (5 mg total) by mouth daily. 90 tablet 0  . lithium carbonate 300 MG capsule Take 300 mg by mouth 2 (two) times daily.    . metFORMIN (GLUCOPHAGE) 1000 MG tablet Take 1 tablet (1,000 mg total) by mouth 2 (two) times daily. 60 tablet 2  . rosuvastatin (CRESTOR) 20 MG tablet Take 1 tablet (20 mg total) by mouth daily. 90 tablet 0  . triamcinolone (KENALOG) 0.1 % Apply 1 application topically 2 (two) times daily. 100 g 0  . TRUEplus Lancets 28G MISC Use as directed 100 each 4  . venlafaxine (EFFEXOR) 75 MG tablet Take 75 mg by mouth 2 (two) times daily with a meal.     No current facility-administered medications on  file prior to visit.    No Known Allergies  Social History   Socioeconomic History  . Marital status: Divorced    Spouse name: Not on file  . Number of children: Not on file  . Years of education: Not on file  . Highest education level: Not on file  Occupational History  . Not on file  Tobacco Use  . Smoking status: Never Smoker  . Smokeless tobacco: Never Used  Vaping Use  . Vaping Use: Never used  Substance and Sexual Activity  . Alcohol use: No  . Drug use: No  . Sexual activity: Not on file  Other Topics Concern  . Not on file  Social History Narrative  . Not on file   Social Determinants of  Health   Financial Resource Strain: Not on file  Food Insecurity: Not on file  Transportation Needs: Not on file  Physical Activity: Not on file  Stress: Not on file  Social Connections: Not on file  Intimate Partner Violence: Not on file    Family History  Problem Relation Age of Onset  . Hypertension Mother   . Diabetes Father   . Cancer Father     No past surgical history on file.  ROS: Review of Systems Negative except as stated above  PHYSICAL EXAM: BP 137/87 (BP Location: Left Arm, Patient Position: Sitting)   Pulse 68   Ht 5' 11.42" (1.814 m)   Wt 209 lb 6.4 oz (95 kg)   SpO2 99%   BMI 28.87 kg/m   Physical Exam HENT:     Head: Normocephalic and atraumatic.  Eyes:     Extraocular Movements: Extraocular movements intact.     Conjunctiva/sclera: Conjunctivae normal.     Pupils: Pupils are equal, round, and reactive to light.  Cardiovascular:     Rate and Rhythm: Normal rate and regular rhythm.     Pulses: Normal pulses.     Heart sounds: Normal heart sounds.  Pulmonary:     Effort: Pulmonary effort is normal.     Breath sounds: Normal breath sounds.  Musculoskeletal:     Cervical back: Normal range of motion and neck supple.  Neurological:     General: No focal deficit present.     Mental Status: He is alert and oriented to person, place, and time.  Psychiatric:        Mood and Affect: Mood normal.        Behavior: Behavior normal.    ASSESSMENT AND PLAN: 1. Essential hypertension: - Blood pressure relative to goal.  - Continue Lisinopril and Amlodipine as prescribed.  - Counseled on blood pressure goal of less than 130/80, low-sodium, DASH diet, medication compliance, 150 minutes of moderate intensity exercise per week as tolerated. Discussed medication compliance, adverse effects. - Follow-up with primary provider in 3 months or sooner if needed.   2. Mixed hyperlipidemia: -Practice low-fat heart healthy diet and at least 150 minutes of moderate  intensity exercise weekly as tolerated.  - Begin Rosuvastatin as prescribed.  - Last lipid panel obtained 02/18/2021. - Follow-up with primary provider as scheduled.    Patient was given the opportunity to ask questions.  Patient verbalized understanding of the plan and was able to repeat key elements of the plan. Patient was given clear instructions to go to Emergency Department or return to medical center if symptoms don't improve, worsen, or new problems develop.The patient verbalized understanding.   Return in about 3 months (around 06/27/2021) for Follow-Up hypertension.  Camillia Herter, NP

## 2021-03-28 ENCOUNTER — Encounter: Payer: Self-pay | Admitting: Family

## 2021-03-28 ENCOUNTER — Ambulatory Visit (INDEPENDENT_AMBULATORY_CARE_PROVIDER_SITE_OTHER): Payer: 59 | Admitting: Family

## 2021-03-28 ENCOUNTER — Other Ambulatory Visit: Payer: Self-pay

## 2021-03-28 VITALS — BP 137/87 | HR 68 | Ht 71.42 in | Wt 209.4 lb

## 2021-03-28 DIAGNOSIS — I10 Essential (primary) hypertension: Secondary | ICD-10-CM

## 2021-03-28 DIAGNOSIS — E782 Mixed hyperlipidemia: Secondary | ICD-10-CM

## 2021-03-28 NOTE — Progress Notes (Signed)
HTN f/u  Blood sugars running in low 200's in mornings before eating

## 2021-03-28 NOTE — Patient Instructions (Signed)
Continue Lisinopril and Hydrochlorothiazide as prescribed. Follow-up in 3 months or sooner if needed.   Keep appointment with clinical pharmacist on 04/01/2021 for diabetes meter check.  Hypertension, Adult Hypertension is another name for high blood pressure. High blood pressure forces your heart to work harder to pump blood. This can cause problems over time. There are two numbers in a blood pressure reading. There is a top number (systolic) over a bottom number (diastolic). It is best to have a blood pressure that is below 120/80. Healthy choices can help lower your blood pressure, or you may need medicine to help lower it. What are the causes? The cause of this condition is not known. Some conditions may be related to high blood pressure. What increases the risk?  Smoking.  Having type 2 diabetes mellitus, high cholesterol, or both.  Not getting enough exercise or physical activity.  Being overweight.  Having too much fat, sugar, calories, or salt (sodium) in your diet.  Drinking too much alcohol.  Having long-term (chronic) kidney disease.  Having a family history of high blood pressure.  Age. Risk increases with age.  Race. You may be at higher risk if you are African American.  Gender. Men are at higher risk than women before age 52. After age 24, women are at higher risk than men.  Having obstructive sleep apnea.  Stress. What are the signs or symptoms?  High blood pressure may not cause symptoms. Very high blood pressure (hypertensive crisis) may cause: ? Headache. ? Feelings of worry or nervousness (anxiety). ? Shortness of breath. ? Nosebleed. ? A feeling of being sick to your stomach (nausea). ? Throwing up (vomiting). ? Changes in how you see. ? Very bad chest pain. ? Seizures. How is this treated?  This condition is treated by making healthy lifestyle changes, such as: ? Eating healthy foods. ? Exercising more. ? Drinking less alcohol.  Your health  care provider may prescribe medicine if lifestyle changes are not enough to get your blood pressure under control, and if: ? Your top number is above 130. ? Your bottom number is above 80.  Your personal target blood pressure may vary. Follow these instructions at home: Eating and drinking  If told, follow the DASH eating plan. To follow this plan: ? Fill one half of your plate at each meal with fruits and vegetables. ? Fill one fourth of your plate at each meal with whole grains. Whole grains include whole-wheat pasta, brown rice, and whole-grain bread. ? Eat or drink low-fat dairy products, such as skim milk or low-fat yogurt. ? Fill one fourth of your plate at each meal with low-fat (lean) proteins. Low-fat proteins include fish, chicken without skin, eggs, beans, and tofu. ? Avoid fatty meat, cured and processed meat, or chicken with skin. ? Avoid pre-made or processed food.  Eat less than 1,500 mg of salt each day.  Do not drink alcohol if: ? Your doctor tells you not to drink. ? You are pregnant, may be pregnant, or are planning to become pregnant.  If you drink alcohol: ? Limit how much you use to:  0-1 drink a day for women.  0-2 drinks a day for men. ? Be aware of how much alcohol is in your drink. In the [place] one drink equals one 12 oz bottle of beer (355 mL), one 5 oz glass of wine (148 mL), or one 1 oz glass of hard liquor (44 mL).   Lifestyle  Work with your doctor  to stay at a healthy weight or to lose weight. Ask your doctor what the best weight is for you.  Get at least 30 minutes of exercise most days of the week. This may include walking, swimming, or biking.  Get at least 30 minutes of exercise that strengthens your muscles (resistance exercise) at least 3 days a week. This may include lifting weights or doing Pilates.  Do not use any products that contain nicotine or tobacco, such as cigarettes, e-cigarettes, and chewing tobacco. If you need help quitting,  ask your doctor.  Check your blood pressure at home as told by your doctor.  Keep all follow-up visits as told by your doctor. This is important.   Medicines  Take over-the-counter and prescription medicines only as told by your doctor. Follow directions carefully.  Do not skip doses of blood pressure medicine. The medicine does not work as well if you skip doses. Skipping doses also puts you at risk for problems.  Ask your doctor about side effects or reactions to medicines that you should watch for. Contact a doctor if you:  Think you are having a reaction to the medicine you are taking.  Have headaches that keep coming back (recurring).  Feel dizzy.  Have swelling in your ankles.  Have trouble with your vision. Get help right away if you:  Get a very bad headache.  Start to feel mixed up (confused).  Feel weak or numb.  Feel faint.  Have very bad pain in your: ? Chest. ? Belly (abdomen).  Throw up more than once.  Have trouble breathing. Summary  Hypertension is another name for high blood pressure.  High blood pressure forces your heart to work harder to pump blood.  For most people, a normal blood pressure is less than 120/80.  Making healthy choices can help lower blood pressure. If your blood pressure does not get lower with healthy choices, you may need to take medicine. This information is not intended to replace advice given to you by your health care provider. Make sure you discuss any questions you have with your health care provider. Document Revised: 08/07/2018 Document Reviewed: 08/07/2018 Elsevier Patient Education  2021 [employer].

## 2021-04-01 ENCOUNTER — Other Ambulatory Visit: Payer: Self-pay

## 2021-04-01 ENCOUNTER — Encounter: Payer: Self-pay | Admitting: Pharmacist

## 2021-04-01 ENCOUNTER — Ambulatory Visit: Payer: 59 | Attending: Family Medicine | Admitting: Pharmacist

## 2021-04-01 DIAGNOSIS — E118 Type 2 diabetes mellitus with unspecified complications: Secondary | ICD-10-CM | POA: Diagnosis not present

## 2021-04-01 DIAGNOSIS — E1165 Type 2 diabetes mellitus with hyperglycemia: Secondary | ICD-10-CM | POA: Diagnosis not present

## 2021-04-01 DIAGNOSIS — E782 Mixed hyperlipidemia: Secondary | ICD-10-CM | POA: Diagnosis not present

## 2021-04-01 DIAGNOSIS — IMO0002 Reserved for concepts with insufficient information to code with codable children: Secondary | ICD-10-CM

## 2021-04-01 LAB — GLUCOSE, POCT (MANUAL RESULT ENTRY): POC Glucose: 149 mg/dl — AB (ref 70–99)

## 2021-04-01 MED ORDER — ROSUVASTATIN CALCIUM 20 MG PO TABS: 20.0000 mg | ORAL_TABLET | Freq: Every day | ORAL | 0 refills | Status: DC

## 2021-04-01 NOTE — Progress Notes (Signed)
S:    PCP: Ricky Stabs  Patient arrives in good spirits.  Presents for diabetes evaluation, education, and management. Patient was referred by his PCP on 02/18/2021. His A1c at that visit was resulted at 8.4% which is up from 7.7% previously. His Actos was discontinued and Marcelline Deist was started. His metformin and glipizide were both continued.   Patient reports Diabetes was diagnosed about 13 years ago. Has only ever used metformin and glipizide. Denies ever using insulin. Denies any hx of pancreatitis or thyroid cancer. Denies family hx of thyroid cancer. No ACS/CAD, CHF, CKD.     Family/Social History:  - FHx: HTN, DM - Tobacco: never smoker  - Alcohol: none   Insurance coverage/medication affordability: [employer]  Medication adherence reported with most medications. He was unable to get [place] due to cost. He did not pick up the rosuvastatin because he was not sure what it was for.   Current diabetes medications include: metformin 1000mg  BID, glipizide 10 mg BID, Farxiga (unable to pick-up d/t cost)  Current hypertension medications include: amlodipine 5 mg daily, lisinopril 5 mg daily  Current hyperlipidemia medications include: rosuvastatin (did not pick-up)  Patient denies hypoglycemic events.  Patient reported dietary habits: plans to make changes to his diet. Recently purchased a juicer and plans to make more fruit and vegetable juices.   Patient-reported exercise habits: walks when he can   Patient denies nocturia (nighttime urination).  Patient denies neuropathy (nerve pain). Patient denies visual changes. Patient reports self foot exams.     O:  POCT glucose: 149 (post-prandial - ate a pack of crackers and a diet Mt. Dew)  Lab Results  Component Value Date   HGBA1C 8.4 (H) 02/18/2021   There were no vitals filed for this visit.  Lipid Panel     Component Value Date/Time   CHOL 181 02/18/2021 1529   TRIG 271 (H) 02/18/2021 1529   HDL 35 (L) 02/18/2021  1529   CHOLHDL 5.2 (H) 02/18/2021 1529   CHOLHDL 4.8 03/31/2014 1609   VLDL 73 (H) 03/31/2014 1609   LDLCALC 100 (H) 02/18/2021 1529   Home fasting blood sugars: reports that his sugars have come down since his appt with Amy. No meter with him today but denies any CBGs >200.    Clinical Atherosclerotic Cardiovascular Disease (ASCVD): No  The 10-year ASCVD risk score 04/20/2021 DC Jr., et al., 2013) is: 29.3%   Values used to calculate the score:     Age: 65 years     Sex: Male     Is Non-Hispanic African American: No     Diabetic: Yes     Tobacco smoker: No     Systolic Blood Pressure: 137 mmHg     Is BP treated: Yes     HDL Cholesterol: 35 mg/dL     Total Cholesterol: 181 mg/dL    A/P: Diabetes longstanding currently uncontrolled. Patient is able to verbalize appropriate hypoglycemia management plan. Medication adherence is fair. Unfortunately, pt is unable to obtain 64 d/t cost issues.There is a copay card available for [place] that might aid in cost but I'm unsure if this can be used with [facility]. Patient expresses desire to remain on current regimen, modify diet, and return in 1 month for reassessment.  -Continued metformin and glipizide only for now. -Extensively discussed pathophysiology of diabetes, recommended lifestyle interventions, dietary effects on blood sugar control. -Pt amenable to checking blood sugar for 1 month. He will bring me his meter upon follow-up.  -  Counseled on s/sx of and management of hypoglycemia -Next A1C anticipated 05/2021.   ASCVD risk - primary prevention in patient with diabetes. Last LDL is not controlled. ASCVD risk score is >20%  - high intensity statin indicated. I offered counseling regarding the necessity of rosuvastatin in this patient and he was agreeable to starting this. He will pick this up today.  -Started rosuvastatin 20 mg.   Written patient instructions provided.  Total time in face to face counseling 15 minutes.   Follow up  Pharmacist Clinic Visit in 1 month.    Butch Penny, PharmD, Patsy [place], CPP Clinical Pharmacist [facility] [phone]

## 2021-05-02 ENCOUNTER — Ambulatory Visit: Payer: 59 | Attending: Family Medicine | Admitting: Pharmacist

## 2021-05-02 ENCOUNTER — Encounter (INDEPENDENT_AMBULATORY_CARE_PROVIDER_SITE_OTHER): Payer: Self-pay

## 2021-05-02 ENCOUNTER — Encounter: Payer: Self-pay | Admitting: Pharmacist

## 2021-05-02 ENCOUNTER — Other Ambulatory Visit: Payer: Self-pay

## 2021-05-02 DIAGNOSIS — E118 Type 2 diabetes mellitus with unspecified complications: Secondary | ICD-10-CM | POA: Diagnosis not present

## 2021-05-02 DIAGNOSIS — IMO0002 Reserved for concepts with insufficient information to code with codable children: Secondary | ICD-10-CM

## 2021-05-02 DIAGNOSIS — E1165 Type 2 diabetes mellitus with hyperglycemia: Secondary | ICD-10-CM | POA: Diagnosis not present

## 2021-05-02 LAB — GLUCOSE, POCT (MANUAL RESULT ENTRY): POC Glucose: 233 mg/dl — AB (ref 70–99)

## 2021-05-02 NOTE — Progress Notes (Signed)
    S:    PCP: Ricky Stabs  Patient arrives in good spirits.  Presents for diabetes evaluation, education, and management. Patient was referred by his PCP on 02/18/2021. I saw him on 04/01/21 and made no changes.   Family/Social History:  - FHx: HTN, DM - Tobacco: never smoker  - Alcohol: none   Insurance coverage/medication affordability: [employer]  Medication adherence reported with most medications. He was unable to get [place] due to cost.  Current diabetes medications include: metformin 1000mg  BID, glipizide 10 mg BID, Farxiga (unable to pick-up d/t cost)  Current hypertension medications include: amlodipine 5 mg daily, lisinopril 5 mg daily  Current hyperlipidemia medications include: rosuvastatin 20 mg daily  Patient denies hypoglycemic events.  Patient reported dietary habits: plans to make changes to his diet. Recently purchased a juicer and plans to make more fruit and vegetable juices.   Patient-reported exercise habits: walks when he can   Patient denies nocturia (nighttime urination).  Patient denies neuropathy (nerve pain). Patient denies visual changes. Patient reports self foot exams.     O:  POCT glucose: 233 (post-prandial - ate a Reese[employer] Cup ~30 minutes prior to this appt)  Lab Results  Component Value Date   HGBA1C 8.4 (H) 02/18/2021   There were no vitals filed for this visit.  Lipid Panel     Component Value Date/Time   CHOL 181 02/18/2021 1529   TRIG 271 (H) 02/18/2021 1529   HDL 35 (L) 02/18/2021 1529   CHOLHDL 5.2 (H) 02/18/2021 1529   CHOLHDL 4.8 03/31/2014 1609   VLDL 73 (H) 03/31/2014 1609   LDLCALC 100 (H) 02/18/2021 1529   Home fasting blood sugars: endorses range from low 100s-190s. Believes he averages around the 150s. No meter with him today.    Clinical Atherosclerotic Cardiovascular Disease (ASCVD): No  The 10-year ASCVD risk score 04/20/2021 DC Jr., et al., 2013) is: 29.3%   Values used to calculate the score:     Age: 65  years     Sex: Male     Is Non-Hispanic African American: No     Diabetic: Yes     Tobacco smoker: No     Systolic Blood Pressure: 137 mmHg     Is BP treated: Yes     HDL Cholesterol: 35 mg/dL     Total Cholesterol: 181 mg/dL    A/P: Diabetes longstanding currently uncontrolled. Patient is able to verbalize appropriate hypoglycemia management plan. Medication adherence is fair. I provided him with a copay card for 64. He will contact me if the copay is too high.  -Continued metformin and glipizide at current doses.  -Encouraged patient to try and pick-up [place]. Printed copay card for him.  -Extensively discussed pathophysiology of diabetes, recommended lifestyle interventions, dietary effects on blood sugar control. -Pt amenable to checking blood sugar for 1 month. He will bring me his meter upon follow-up.  -Counseled on s/sx of and management of hypoglycemia -Next A1C anticipated 05/2021.   ASCVD risk - primary prevention in patient with diabetes. Last LDL is not controlled. ASCVD risk score is >20%  - high intensity statin indicated. Pt is compliant with his rosuvastatin.   -Continue rosuvastatin 20 mg.   Written patient instructions provided.  Total time in face to face counseling 15 minutes.   Follow up Pharmacist Clinic Visit in 1 month.    06/2021, PharmD, Butch Penny, CPP Clinical Pharmacist [facility] [phone]

## 2021-06-02 ENCOUNTER — Ambulatory Visit: Payer: 59 | Attending: Family | Admitting: Pharmacist

## 2021-06-02 ENCOUNTER — Other Ambulatory Visit: Payer: Self-pay

## 2021-06-02 ENCOUNTER — Encounter: Payer: Self-pay | Admitting: Pharmacist

## 2021-06-02 DIAGNOSIS — IMO0002 Reserved for concepts with insufficient information to code with codable children: Secondary | ICD-10-CM

## 2021-06-02 DIAGNOSIS — E118 Type 2 diabetes mellitus with unspecified complications: Secondary | ICD-10-CM | POA: Diagnosis not present

## 2021-06-02 DIAGNOSIS — E1165 Type 2 diabetes mellitus with hyperglycemia: Secondary | ICD-10-CM | POA: Diagnosis not present

## 2021-06-02 NOTE — Progress Notes (Signed)
    S:    PCP: Ricky Stabs  Patient arrives in good spirits.  Presents for diabetes evaluation, education, and management. Patient was referred by his PCP on 02/18/2021. I saw him on 05/02/2021 and provided him with a [place] copay card.   Today, he tells me he did pick-up the [place]. He reports compliance with his medications, however, he tells me that his CBGs at home are >200 most of the time. He is hesitant to add medications today.   Family/Social History:  - FHx: HTN, DM - Tobacco: never smoker  - Alcohol: none   Insurance coverage/medication affordability: [employer]  Medication adherence reported. Current diabetes medications include: metformin 1000mg  BID, glipizide 10 mg BID, Farxiga (unable to pick-up d/t cost)  Current hypertension medications include: amlodipine 5 mg daily, lisinopril 5 mg daily  Current hyperlipidemia medications include: rosuvastatin 20 mg daily  Patient denies hypoglycemic events.  Patient reported dietary habits: plans to make changes to his diet. Recently purchased a juicer and plans to make more fruit and vegetable juices.   Patient-reported exercise habits: walks when he can   Patient denies nocturia (nighttime urination).  Patient denies neuropathy (nerve pain). Patient denies visual changes. Patient reports self foot exams.     O:  Lab Results  Component Value Date   HGBA1C 8.4 (H) 02/18/2021   There were no vitals filed for this visit.  Lipid Panel     Component Value Date/Time   CHOL 181 02/18/2021 1529   TRIG 271 (H) 02/18/2021 1529   HDL 35 (L) 02/18/2021 1529   CHOLHDL 5.2 (H) 02/18/2021 1529   CHOLHDL 4.8 03/31/2014 1609   VLDL 73 (H) 03/31/2014 1609   LDLCALC 100 (H) 02/18/2021 1529      Clinical Atherosclerotic Cardiovascular Disease (ASCVD): No  The 10-year ASCVD risk score 04/20/2021 DC Jr., et al., 2013) is: 29.3%   Values used to calculate the score:     Age: 65 years     Sex: Male     Is Non-Hispanic African  American: No     Diabetic: Yes     Tobacco smoker: No     Systolic Blood Pressure: 137 mmHg     Is BP treated: Yes     HDL Cholesterol: 35 mg/dL     Total Cholesterol: 181 mg/dL    A/P: Diabetes longstanding currently uncontrolled. Patient is able to verbalize appropriate hypoglycemia management plan. Medication adherence is fair.I talked with him about Trulicity. He has an appointment with Amy next month. He will ask her about this but does not wish to start it today. -Continued current regimen.  -Extensively discussed pathophysiology of diabetes, recommended lifestyle interventions, dietary effects on blood sugar control. -Pt amenable to checking blood sugar for 1 month. He will bring me his meter upon follow-up.  -Counseled on s/sx of and management of hypoglycemia -Next A1C anticipated at PCP follow-up.   ASCVD risk - primary prevention in patient with diabetes. Last LDL is not controlled. ASCVD risk score is >20%  - high intensity statin indicated. Pt is compliant with his rosuvastatin.   -Continue rosuvastatin 20 mg.   Written patient instructions provided.  Total time in face to face counseling 15 minutes.   Follow up PCP  Clinic Visit in 1 month.    65, PharmD, Butch Penny, CPP Clinical Pharmacist [facility] & [facility] [phone]

## 2021-06-16 ENCOUNTER — Other Ambulatory Visit: Payer: Self-pay | Admitting: Family

## 2021-06-16 DIAGNOSIS — I10 Essential (primary) hypertension: Secondary | ICD-10-CM

## 2021-06-26 NOTE — Progress Notes (Signed)
Patient ID: Alan Reeves, male    DOB: 1956-08-08  MRN: [id number]  CC: Hypertension and Diabetes Follow-Up   Subjective: Alan Reeves is a 65 y.o. male who presents for hypertension and diabetes follow-up.   His concerns today include:   HYPERTENSION FOLLOW-UP: 03/28/2021: - Blood pressure relative to goal. - Continue Lisinopril and Amlodipine as prescribed.  06/27/2021: Doing well on current regimen. No side effects. No issues/concerns. Denies chest pain and shortness of breath.  2. DIABETES FOLLOW-UP: Visit [facility]/02/2021 at [facility] per PharmD note: Diabetes longstanding currently uncontrolled. Patient is able to verbalize appropriate hypoglycemia management plan. Medication adherence is fair. I provided him with a copay card for [place]. He will contact me if the copay is too high.  -Continued metformin and glipizide at current doses. -Encouraged patient to try and pick-up [place]. Printed copay card for him.  -Extensively discussed pathophysiology of diabetes, recommended lifestyle interventions, dietary effects on blood sugar control. -Pt amenable to checking blood sugar for 1 month. He will bring me his meter upon follow-up. -Counseled on s/sx of and management of hypoglycemia -Next A1C anticipated 05/2021.  Visit 06/02/2021 at [place] per PharmD note: Diabetes longstanding currently uncontrolled. Patient is able to verbalize appropriate hypoglycemia management plan. Medication adherence is fair.I talked with him about Trulicity. He has an appointment with Alan Reeves next month. He will ask her about this but does not wish to start it today. -Continued current regimen. -Extensively discussed pathophysiology of diabetes, recommended lifestyle interventions, dietary effects on blood sugar control. -Pt amenable to checking blood sugar for 1 month. He will bring me his meter upon follow-up. -Counseled on  s/sx of and management of hypoglycemia -Next A1C anticipated at PCP follow-up.   06/27/2021: Home blood sugars in the low to mid 200's. Unable to consistently take [place] Gl[place] even with copay card related to the cost of medication. Not interested in beginning Trulicity or any additional diabetes medications at the moment. Would like to be referred to a nutritionist to see if diet changes will help lower A1c. Still taking Metformin and Glipizide.  3. LITHIUM LEVELS: Reports [facility] requesting lithium levels be obtained in primary care and sent to their office per Alan Mengistu, NP.    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   Diabetes mellitus without complication ([place]) 24/82/5003   Keratoconjunctivitis sicca of both eyes not specified as Sjogren's 10/08/2017   Long term current use of oral hypoglycemic drug 10/08/2017   Nuclear sclerotic cataract of both eyes 10/08/2017   Presbyopia of both eyes 10/08/2017   Vitreous floaters, bilateral 10/08/2017   Benign paroxysmal positional vertigo 11/15/2016   Impacted cerumen of left ear 11/15/2016   Needs flu shot 11/15/2016   Hyperlipemia 07/07/2016   Overweight (BMI 25.0-29.9) 07/07/2016   Essential hypertension 04/05/2016   Type 2 diabetes mellitus with hyperglycemia ([place]) 04/05/2016   Frozen shoulder 09/24/2014   Shoulder impingement syndrome 09/24/2014     Current Outpatient Medications on File Prior to Visit  Medication Sig Dispense Refill   Blood Glucose Monitoring Suppl (TRUE METRIX METER) w/Device KIT Use as directed 1 kit 0   clindamycin (CLEOCIN) 300 MG capsule Take 300 mg by mouth every 12 (twelve) hours.     glucose blood (TRUE METRIX BLOOD GLUCOSE TEST) test strip Use as instructed 100 each 12  HYDROcodone-acetaminophen (NORCO/VICODIN) 5-325 MG tablet Take 1 tablet by mouth every 6 (six) hours as needed.      lithium carbonate 300 MG capsule Take 300 mg by mouth 2 (two) times daily.     pioglitazone (ACTOS) 45 MG tablet Take 45 mg by mouth daily.     triamcinolone (KENALOG) 0.1 % Apply 1 application topically 2 (two) times daily. 100 g 0   TRUEplus Lancets 28G MISC Use as directed 100 each 4   venlafaxine (EFFEXOR) 75 MG tablet Take 75 mg by mouth 2 (two) times daily with a meal.     No current facility-administered medications on file prior to visit.    No Known Allergies  Social History   Socioeconomic History   Marital status: Divorced    Spouse name: Not on file   Number of children: Not on file   Years of education: Not on file   Highest education level: Not on file  Occupational History   Not on file  Tobacco Use   Smoking status: Never   Smokeless tobacco: Never  Vaping Use   Vaping Use: Never used  Substance and Sexual Activity   Alcohol use: No   Drug use: No   Sexual activity: Not on file  Other Topics Concern   Not on file  Social History Narrative   Not on file   Social Determinants of Health   Financial Resource Strain: Not on file  Food Insecurity: Not on file  Transportation Needs: Not on file  Physical Activity: Not on file  Stress: Not on file  Social Connections: Not on file  Intimate Partner Violence: Not on file    Family History  Problem Relation Age of Onset   Hypertension Mother    Diabetes Father    Cancer Father     No past surgical history on file.  ROS: Review of Systems Negative except as stated above  PHYSICAL EXAM: BP 114/78 (BP Location: Left Arm, Patient Position: Sitting, Cuff Size: Normal)   Pulse 71   Temp 97.9 F (36.6 C)   Resp 15   Ht 5' 11.42" (1.814 m)   Wt 209 lb 1.6 oz (94.8 kg)   SpO2 94%   BMI 28.82 kg/m   Physical Exam HENT:     Head: Normocephalic and atraumatic.  Eyes:     Extraocular Movements: Extraocular movements intact.     Conjunctiva/sclera: Conjunctivae normal.     Pupils: Pupils are  equal, round, and reactive to light.  Cardiovascular:     Rate and Rhythm: Normal rate and regular rhythm.     Pulses: Normal pulses.  Pulmonary:     Effort: Pulmonary effort is normal.     Breath sounds: Normal breath sounds.  Musculoskeletal:     Cervical back: Normal range of motion and neck supple.  Neurological:     General: No focal deficit present.     Mental Status: He is alert and oriented to person, place, and time.  Psychiatric:        Mood and Affect: Mood normal.        Behavior: Behavior normal.    ASSESSMENT AND PLAN: 1. Essential hypertension: - Continue Amlodipine and Lisinopril as prescribed.  - Counseled on blood pressure goal of less than 130/80, low-sodium, DASH diet, medication compliance, 150 minutes of moderate intensity exercise per week as tolerated. Discussed medication compliance, adverse effects. - BMP to evaluate kidney function and electrolyte balance. - Follow-up with primary provider in 3 months or  sooner if needed. - Basic Metabolic Panel - amLODipine (NORVASC) 5 MG tablet; Take 1 tablet (5 mg total) by mouth daily.  Dispense: 90 tablet; Refill: 0 - lisinopril (ZESTRIL) 5 MG tablet; Take 1 tablet (5 mg total) by mouth daily.  Dispense: 90 tablet; Refill: 0  2. Type 2 diabetes mellitus with hyperglycemia, without long-term current use of insulin ([place]): - Hemoglobin A1c today not at goal at 8.1%, goal < 7%. This is mildly decreased from previous hemoglobin A1c 8.4% on 02/18/2021. - Continue Metformin and Glipizide as prescribed.  - Patient has been unable to take Dapagliflozin Propanediol related to financial concerns, the copay card has not been helpful. - Patient is not ready to begin additional diabetes medications as of present.  - Per patient request referral to Medical Nutrition Therapy for dietary counseling.  - Discussed the importance of healthy eating habits, low-carbohydrate diet, low-sugar diet, regular aerobic exercise (at least 150 minutes  a week as tolerated) and medication compliance to achieve or maintain control of diabetes. - To achieve an A1C goal of less than or equal to 7.0 percent, a fasting blood sugar of 80 to 130 mg/dL and a postprandial glucose (90 to 120 minutes after a meal) less than 180 mg/dL. In the event of sugars less than 60 mg/dl or greater than 400 mg/dl please notify the clinic ASAP. It is recommended that you undergo annual eye exams and annual foot exams. - Follow-up with primary provider in 4 weeks or sooner if needed for repeat hemoglobin A1c. - POCT glycosylated hemoglobin (Hb A1C) - metFORMIN (GLUCOPHAGE) 1000 MG tablet; Take 1 tablet (1,000 mg total) by mouth 2 (two) times daily.  Dispense: 180 tablet; Refill: 0 - glipiZIDE (GLUCOTROL) 10 MG tablet; Take 1 tablet (10 mg total) by mouth 2 (two) times daily.  Dispense: 180 tablet; Refill: 0 - Amb ref to Medical Nutrition Therapy-[facility]  3. Lithium level checked every six months: -Lithium level per patient request to send to [facility].  - Lithium level  4. Mixed dyslipidemia: -Practice low-fat heart healthy diet and at least 150 minutes of moderate intensity exercise weekly as tolerated.  - Continue Rosuvastatin as prescribed.  - Follow-up with primary provider as scheduled.  - rosuvastatin (CRESTOR) 20 MG tablet; Take 1 tablet (20 mg total) by mouth daily.  Dispense: 120 tablet; Refill: 0   Patient was given the opportunity to ask questions.  Patient verbalized understanding of the plan and was able to repeat key elements of the plan. Patient was given clear instructions to go to Emergency Department or return to medical center if symptoms don't improve, worsen, or new problems develop.The patient verbalized understanding.   Orders Placed This Encounter  Procedures   Basic Metabolic Panel   Lithium level   Amb ref to Medical Nutrition Therapy-[facility]   POCT glycosylated hemoglobin (Hb A1C)     Requested Prescriptions   Signed Prescriptions Disp  Refills   amLODipine (NORVASC) 5 MG tablet 90 tablet 0    Sig: Take 1 tablet (5 mg total) by mouth daily.   lisinopril (ZESTRIL) 5 MG tablet 90 tablet 0    Sig: Take 1 tablet (5 mg total) by mouth daily.   metFORMIN (GLUCOPHAGE) 1000 MG tablet 180 tablet 0    Sig: Take 1 tablet (1,000 mg total) by mouth 2 (two) times daily.   glipiZIDE (GLUCOTROL) 10 MG tablet 180 tablet 0    Sig: Take 1 tablet (10 mg total) by mouth 2 (two) times daily.  rosuvastatin (CRESTOR) 20 MG tablet 120 tablet 0    Sig: Take 1 tablet (20 mg total) by mouth daily.    Follow-up in 3 months for hypertension and 4 weeks for diabetes.   Camillia Herter, NP

## 2021-06-27 ENCOUNTER — Ambulatory Visit (INDEPENDENT_AMBULATORY_CARE_PROVIDER_SITE_OTHER): Payer: 59 | Admitting: Family

## 2021-06-27 ENCOUNTER — Other Ambulatory Visit: Payer: Self-pay

## 2021-06-27 VITALS — BP 114/78 | HR 71 | Temp 97.9°F | Resp 15 | Ht 71.42 in | Wt 209.1 lb

## 2021-06-27 DIAGNOSIS — E782 Mixed hyperlipidemia: Secondary | ICD-10-CM | POA: Diagnosis not present

## 2021-06-27 DIAGNOSIS — I10 Essential (primary) hypertension: Secondary | ICD-10-CM | POA: Diagnosis not present

## 2021-06-27 DIAGNOSIS — Z79899 Other long term (current) drug therapy: Secondary | ICD-10-CM

## 2021-06-27 DIAGNOSIS — E1165 Type 2 diabetes mellitus with hyperglycemia: Secondary | ICD-10-CM | POA: Diagnosis not present

## 2021-06-27 LAB — POCT GLYCOSYLATED HEMOGLOBIN (HGB A1C): Hemoglobin A1C: 8.1 % — AB (ref 4.0–5.6)

## 2021-06-27 MED ORDER — AMLODIPINE BESYLATE 5 MG PO TABS: 5.0000 mg | ORAL_TABLET | Freq: Every day | ORAL | 0 refills | Status: DC

## 2021-06-27 MED ORDER — GLIPIZIDE 10 MG PO TABS: 10.0000 mg | ORAL_TABLET | Freq: Two times a day (BID) | ORAL | 0 refills | Status: DC

## 2021-06-27 MED ORDER — METFORMIN HCL 1000 MG PO TABS
1000.0000 mg | ORAL_TABLET | Freq: Two times a day (BID) | ORAL | 0 refills | Status: DC
Start: 2021-06-27 — End: 2021-10-26

## 2021-06-27 MED ORDER — LISINOPRIL 5 MG PO TABS: 5.0000 mg | ORAL_TABLET | Freq: Every day | ORAL | 0 refills | Status: DC

## 2021-06-27 MED ORDER — ROSUVASTATIN CALCIUM 20 MG PO TABS: 20.0000 mg | ORAL_TABLET | Freq: Every day | ORAL | 0 refills | Status: DC

## 2021-06-27 NOTE — Progress Notes (Signed)
Pt presents for hypertension follow-up  Pt Alan Mengistu, MD at [facility] req Lithium level ordered and send results to them

## 2021-06-28 LAB — BASIC METABOLIC PANEL
BUN/Creatinine Ratio: 12 (ref 10–24)
BUN: 15 mg/dL (ref 8–27)
CO2: 18 mmol/L — ABNORMAL LOW (ref 20–29)
Calcium: 9.4 mg/dL (ref 8.6–10.2)
Chloride: 101 mmol/L (ref 96–106)
Creatinine, Ser: 1.29 mg/dL — ABNORMAL HIGH (ref 0.76–1.27)
Glucose: 188 mg/dL — ABNORMAL HIGH (ref 65–99)
Potassium: 4.5 mmol/L (ref 3.5–5.2)
Sodium: 136 mmol/L (ref 134–144)
eGFR: 62 mL/min/{1.73_m2} (ref >59)

## 2021-06-28 LAB — LITHIUM LEVEL: Lithium Lvl: 0.6 mmol/L (ref 0.5–1.2)

## 2021-06-28 NOTE — Progress Notes (Signed)
Please call patient with update.   Kidney function normal.   Creatinine mildly elevated. This also checks for kidney function. Reminder to keep hydrated.

## 2021-08-02 ENCOUNTER — Ambulatory Visit: Payer: 59 | Admitting: Family

## 2021-08-02 NOTE — Progress Notes (Signed)
Patient ID: Alan Reeves, male    DOB: [birth year]  MRN: [id number]  CC: Diabetes Follow-Up   Subjective: Alan Reeves is a 65 y.o. male who presents for diabetes follow-up.   His concerns today include:   DIABETES TYPE 2 FOLLOW-UP: 06/27/2021: - Hemoglobin A1c today not at goal at 8.1%, goal < 7%. This is mildly decreased from previous hemoglobin A1c 8.4% on 02/18/2021. - Continue Metformin and Glipizide as prescribed.  - Patient has been unable to take Dapagliflozin Propanediol related to financial concerns, the copay card has not been helpful. - Patient is not ready to begin additional diabetes medications as of present.  - Per patient request referral to Medical Nutrition Therapy for dietary counseling.   08/03/2021: Still only taking Metformin and Glipizide. Has been unable to get [place]. Checking blood sugars at home with varying numbers. Recalled as low as 91 and as high as 244.   2. INSOMNIA: Ongoing for many years. Has tried many over-the-counter medications without relief. Interested in Qviviq after seeing a commercial on television.   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   Diabetes mellitus without complication ([place]) 98/92/1194   Keratoconjunctivitis sicca of both eyes not specified as Sjogren's 10/08/2017   Long term current use of oral hypoglycemic drug 10/08/2017   Nuclear sclerotic cataract of both eyes 10/08/2017   Presbyopia of both eyes 10/08/2017   Vitreous floaters, bilateral 10/08/2017   Benign paroxysmal positional vertigo 11/15/2016   Impacted cerumen of left ear 11/15/2016   Needs flu shot 11/15/2016   Hyperlipemia 07/07/2016   Overweight (BMI 25.0-29.9) 07/07/2016   Essential hypertension 04/05/2016   Type 2 diabetes mellitus with hyperglycemia ([place]) 04/05/2016   Frozen shoulder 09/24/2014   Shoulder impingement syndrome  09/24/2014     Current Outpatient Medications on File Prior to Visit  Medication Sig Dispense Refill   amLODipine (NORVASC) 5 MG tablet Take 1 tablet (5 mg total) by mouth daily. 90 tablet 0   Blood Glucose Monitoring Suppl (TRUE METRIX METER) w/Device KIT Use as directed 1 kit 0   clindamycin (CLEOCIN) 300 MG capsule Take 300 mg by mouth every 12 (twelve) hours.     glipiZIDE (GLUCOTROL) 10 MG tablet Take 1 tablet (10 mg total) by mouth 2 (two) times daily. 180 tablet 0   glucose blood (TRUE METRIX BLOOD GLUCOSE TEST) test strip Use as instructed 100 each 12   HYDROcodone-acetaminophen (NORCO/VICODIN) 5-325 MG tablet Take 1 tablet by mouth every 6 (six) hours as needed.     lisinopril (ZESTRIL) 5 MG tablet Take 1 tablet (5 mg total) by mouth daily. 90 tablet 0   lithium carbonate 300 MG capsule Take 300 mg by mouth 2 (two) times daily.     metFORMIN (GLUCOPHAGE) 1000 MG tablet Take 1 tablet (1,000 mg total) by mouth 2 (two) times daily. 180 tablet 0   pioglitazone (ACTOS) 45 MG tablet Take 45 mg by mouth daily.     rosuvastatin (CRESTOR) 20 MG tablet Take 1 tablet (20 mg total) by mouth daily. 120 tablet 0   triamcinolone (KENALOG) 0.1 % Apply 1 application topically 2 (two) times daily. 100 g 0   TRUEplus Lancets 28G MISC Use as directed 100 each 4   venlafaxine (EFFEXOR) 75 MG tablet Take 75 mg by mouth 2 (two) times daily with a meal.     No current facility-administered  medications on file prior to visit.    No Known Allergies  Social History   Socioeconomic History   Marital status: Divorced    Spouse name: Not on file   Number of children: Not on file   Years of education: Not on file   Highest education level: Not on file  Occupational History   Not on file  Tobacco Use   Smoking status: Never   Smokeless tobacco: Never  Vaping Use   Vaping Use: Never used  Substance and Sexual Activity   Alcohol use: No   Drug use: No   Sexual activity: Not on file  Other Topics  Concern   Not on file  Social History Narrative   Not on file   Social Determinants of Health   Financial Resource Strain: Not on file  Food Insecurity: Not on file  Transportation Needs: Not on file  Physical Activity: Not on file  Stress: Not on file  Social Connections: Not on file  Intimate Partner Violence: Not on file    Family History  Problem Relation Age of Onset   Hypertension Mother    Diabetes Father    Cancer Father     No past surgical history on file.  ROS: Review of Systems Negative except as stated above  PHYSICAL EXAM: BP (!) 138/95 (BP Location: Left Arm, Patient Position: Sitting, Cuff Size: Normal)   Pulse 94   Temp 98.4 F (36.9 C)   Resp 18   Ht 5' 11.42" (1.814 m)   Wt 206 lb 3.2 oz (93.5 kg)   SpO2 95%   BMI 28.42 kg/m   Physical Exam HENT:     Head: Normocephalic and atraumatic.  Eyes:     Extraocular Movements: Extraocular movements intact.     Conjunctiva/sclera: Conjunctivae normal.     Pupils: Pupils are equal, round, and reactive to light.  Cardiovascular:     Rate and Rhythm: Normal rate and regular rhythm.     Pulses: Normal pulses.     Heart sounds: Normal heart sounds.  Pulmonary:     Effort: Pulmonary effort is normal.     Breath sounds: Normal breath sounds.  Musculoskeletal:     Cervical back: Normal range of motion and neck supple.  Neurological:     General: No focal deficit present.     Mental Status: He is alert and oriented to person, place, and time.  Psychiatric:        Mood and Affect: Mood normal.        Behavior: Behavior normal.    ASSESSMENT AND PLAN: 1. Type 2 diabetes mellitus with hyperglycemia, without long-term current use of insulin ([facility]): - Hemoglobin A1c not at goal today at 8.5%, goal < 7%. This is increased from previous hemoglobin A1c of 8.1% on 06/27/2021.  - Continue Metformin and Glipizide as prescribed.  - Begin Dapagliflozin Propanediol 5 mg daily as prescribed.Patient provided with  office samples. - Discussed the importance of healthy eating habits, low-carbohydrate diet, low-sugar diet, regular aerobic exercise (at least 150 minutes a week as tolerated) and medication compliance to achieve or maintain control of diabetes. - Follow-up with primary provider in 4 to 6 weeks or sooner if needed.  - POCT glycosylated hemoglobin (Hb A1C)  2. Insomnia, unspecified type: - Counseled patient that I do not prescribe hypnotic medications such as his request for Daridorexant.  - Referral to Sleep Studies for further evaluation and management.  - Ambulatory referral to Sleep Studies      Patient was given the opportunity to ask questions.  Patient verbalized understanding of the plan and was able to repeat key elements of the plan. Patient was given clear instructions to go to Emergency Department or return to medical center if symptoms don't improve, worsen, or new problems develop.The patient verbalized understanding.   Orders Placed This Encounter  Procedures   Ambulatory referral to Sleep Studies   POCT glycosylated hemoglobin (Hb A1C)     Return in about 2 months (around 10/03/2021) for Follow-Up or next available diabetes .  Amy J Stephens, NP   

## 2021-08-03 ENCOUNTER — Ambulatory Visit (INDEPENDENT_AMBULATORY_CARE_PROVIDER_SITE_OTHER): Payer: 59 | Admitting: Family

## 2021-08-03 ENCOUNTER — Encounter: Payer: Self-pay | Admitting: Family

## 2021-08-03 ENCOUNTER — Other Ambulatory Visit: Payer: Self-pay

## 2021-08-03 VITALS — BP 138/95 | HR 94 | Temp 98.4°F | Resp 18 | Ht 71.42 in | Wt 206.2 lb

## 2021-08-03 DIAGNOSIS — E1165 Type 2 diabetes mellitus with hyperglycemia: Secondary | ICD-10-CM

## 2021-08-03 DIAGNOSIS — G4700 Insomnia, unspecified: Secondary | ICD-10-CM

## 2021-08-03 LAB — POCT GLYCOSYLATED HEMOGLOBIN (HGB A1C): Hemoglobin A1C: 8.5 % — AB (ref 4.0–5.6)

## 2021-08-03 NOTE — Progress Notes (Signed)
Pt presents for diabetes check up, pt reports still having insomnia, wants to try [facility]

## 2021-08-12 ENCOUNTER — Ambulatory Visit
Admission: EM | Admit: 2021-08-12 | Discharge: 2021-08-12 | Disposition: A | Discharge status: Home / Self Care | Payer: 59 | Attending: Urgent Care | Admitting: Urgent Care

## 2021-08-12 ENCOUNTER — Other Ambulatory Visit: Payer: Self-pay

## 2021-08-12 ENCOUNTER — Ambulatory Visit (INDEPENDENT_AMBULATORY_CARE_PROVIDER_SITE_OTHER): Payer: 59

## 2021-08-12 DIAGNOSIS — B9789 Other viral agents as the cause of diseases classified elsewhere: Secondary | ICD-10-CM

## 2021-08-12 DIAGNOSIS — R0989 Other specified symptoms and signs involving the circulatory and respiratory systems: Secondary | ICD-10-CM

## 2021-08-12 DIAGNOSIS — J988 Other specified respiratory disorders: Secondary | ICD-10-CM | POA: Diagnosis not present

## 2021-08-12 DIAGNOSIS — R059 Cough, unspecified: Secondary | ICD-10-CM

## 2021-08-12 DIAGNOSIS — E119 Type 2 diabetes mellitus without complications: Secondary | ICD-10-CM

## 2021-08-12 HISTORY — DX: Essential (primary) hypertension: I10

## 2021-08-12 HISTORY — DX: Hyperlipidemia, unspecified: E78.5

## 2021-08-12 MED ORDER — PSEUDOEPHEDRINE HCL 60 MG PO TABS: 60.0000 mg | ORAL_TABLET | Freq: Three times a day (TID) | ORAL | 0 refills | Status: DC | PRN

## 2021-08-12 MED ORDER — PROMETHAZINE-DM 6.25-15 MG/5ML PO SYRP: 5.0000 mL | ORAL_SOLUTION | Freq: Every evening | ORAL | 0 refills | Status: DC | PRN

## 2021-08-12 MED ORDER — CETIRIZINE HCL 10 MG PO TABS: 10.0000 mg | ORAL_TABLET | Freq: Every day | ORAL | 0 refills | Status: DC

## 2021-08-12 NOTE — ED Triage Notes (Signed)
Pt c/o fatigue, cough, drainage, body aches and chills. States he is getting "worn out" quickly and sweaty. Denies head ache, nausea or vomiting, diarrhea or constipation.   States he had a "Chest cold" over a week ago and has not recovered.

## 2021-08-12 NOTE — ED Provider Notes (Signed)
[facility]   MRN: [id number] DOB: Oct 15, 1956  Subjective:   Alan Reeves is a 65 y.o. male presenting for 10-day history of acute onset persistent cough, chest congestion, fatigue, shortness of breath, body aches, chills, weakness.  Patient has used some over-the-counter medications for symptoms with some relief.  Denies any active chest pain, fevers, headache, nausea, vomiting, diarrhea, constipation, abdominal pain.  He does have type 2 diabetes, last A1c was 8.5% a few days ago.  Denies history of respiratory disorders.  Patient is not a smoker.  He would like to make sure he has a COVID-19 test.  No current facility-administered medications for this encounter.  Current Outpatient Medications:    amLODipine (NORVASC) 5 MG tablet, Take 1 tablet (5 mg total) by mouth daily., Disp: 90 tablet, Rfl: 0   Blood Glucose Monitoring Suppl (TRUE METRIX METER) w/Device KIT, Use as directed, Disp: 1 kit, Rfl: 0   clindamycin (CLEOCIN) 300 MG capsule, Take 300 mg by mouth every 12 (twelve) hours., Disp: , Rfl:    glipiZIDE (GLUCOTROL) 10 MG tablet, Take 1 tablet (10 mg total) by mouth 2 (two) times daily., Disp: 180 tablet, Rfl: 0   glucose blood (TRUE METRIX BLOOD GLUCOSE TEST) test strip, Use as instructed, Disp: 100 each, Rfl: 12   HYDROcodone-acetaminophen (NORCO/VICODIN) 5-325 MG tablet, Take 1 tablet by mouth every 6 (six) hours as needed., Disp: , Rfl:    lisinopril (ZESTRIL) 5 MG tablet, Take 1 tablet (5 mg total) by mouth daily., Disp: 90 tablet, Rfl: 0   lithium carbonate 300 MG capsule, Take 300 mg by mouth 2 (two) times daily., Disp: , Rfl:    metFORMIN (GLUCOPHAGE) 1000 MG tablet, Take 1 tablet (1,000 mg total) by mouth 2 (two) times daily., Disp: 180 tablet, Rfl: 0   pioglitazone (ACTOS) 45 MG tablet, Take 45 mg by mouth daily., Disp: , Rfl:    rosuvastatin (CRESTOR) 20 MG tablet, Take 1 tablet (20 mg total) by mouth daily., Disp: 120 tablet, Rfl: 0   triamcinolone  (KENALOG) 0.1 %, Apply 1 application topically 2 (two) times daily., Disp: 100 g, Rfl: 0   TRUEplus Lancets 28G MISC, Use as directed, Disp: 100 each, Rfl: 4   venlafaxine (EFFEXOR) 75 MG tablet, Take 75 mg by mouth 2 (two) times daily with a meal., Disp: , Rfl:    No Known Allergies  Past Medical History:  Diagnosis Date   Anxiety    Depression    Diabetes mellitus without complication ([place])    Hyperlipidemia    Hypertension      History reviewed. No pertinent surgical history.  Family History  Problem Relation Age of Onset   Hypertension Mother    Diabetes Father    Cancer Father     Social History   Tobacco Use   Smoking status: Never   Smokeless tobacco: Never  Vaping Use   Vaping Use: Never used  Substance Use Topics   Alcohol use: No   Drug use: No    ROS   Objective:   Vitals: BP 103/69 (BP Location: Right Arm)   Pulse 80   Temp 97.9 F (36.6 C) (Oral)   Resp 18   SpO2 94%   Pulse oximetry was 96% on recheck.   Wt Readings from Last 3 Encounters:  08/03/21 206 lb 3.2 oz (93.5 kg)  06/27/21 209 lb 1.6 oz (94.8 kg)  03/28/21 209 lb 6.4 oz (95 kg)   Temp Readings from Last 3 Encounters:  08/12/21 97.9 F (36.6 C) (Oral)  08/03/21 98.4 F (36.9 C)  06/27/21 97.9 F (36.6 C)   BP Readings from Last 3 Encounters:  08/12/21 103/69  08/03/21 (!) 138/95  06/27/21 114/78   Pulse Readings from Last 3 Encounters:  08/12/21 80  08/03/21 94  06/27/21 71   Physical Exam Constitutional:      General: He is not in acute distress.    Appearance: Normal appearance. He is well-developed. He is not ill-appearing, toxic-appearing or diaphoretic.  HENT:     Head: Normocephalic and atraumatic.     Right Ear: External ear normal.     Left Ear: External ear normal.     Nose: Nose normal.     Mouth/Throat:     Mouth: Mucous membranes are moist.     Pharynx: Oropharynx is clear.  Eyes:     General: No scleral icterus.    Extraocular Movements:  Extraocular movements intact.     Pupils: Pupils are equal, round, and reactive to light.  Cardiovascular:     Rate and Rhythm: Normal rate and regular rhythm.     Heart sounds: Normal heart sounds. No murmur heard.   No friction rub. No gallop.  Pulmonary:     Effort: Pulmonary effort is normal. No respiratory distress.     Breath sounds: Normal breath sounds. No stridor. No wheezing, rhonchi or rales.  Skin:    General: Skin is warm and dry.  Neurological:     Mental Status: He is alert and oriented to person, place, and time.  Psychiatric:        Mood and Affect: Mood normal.        Behavior: Behavior normal.        Thought Content: Thought content normal.    DG Chest 2 View  Result Date: 08/12/2021 CLINICAL DATA:  Cough and congestion, productive cough for 1 week EXAM: CHEST - 2 VIEW COMPARISON:  None available FINDINGS: The heart size and mediastinal contours are within normal limits. Both lungs are clear. The visualized skeletal structures are unremarkable. Trachea midline.  Aorta atherosclerotic. IMPRESSION: No active cardiopulmonary disease. Electronically Signed   By: M.  Shick M.D.   On: 08/12/2021 13:19     Recent Results (from the past 2160 hour(s))  Basic Metabolic Panel     Status: Abnormal   Collection Time: 06/27/21  3:30 PM  Result Value Ref Range   Glucose 188 (H) 65 - 99 mg/dL   BUN 15 8 - 27 mg/dL   Creatinine, Ser 1.29 (H) 0.76 - 1.27 mg/dL   eGFR 62 >59 mL/min/1.73   BUN/Creatinine Ratio 12 10 - 24   Sodium 136 134 - 144 mmol/L   Potassium 4.5 3.5 - 5.2 mmol/L   Chloride 101 96 - 106 mmol/L   CO2 18 (L) 20 - 29 mmol/L   Calcium 9.4 8.6 - 10.2 mg/dL  Lithium level     Status: None   Collection Time: 06/27/21  3:30 PM  Result Value Ref Range   Lithium Lvl 0.6 0.5 - 1.2 mmol/L    Comment: Plasma concentration of 0.5 - 0.8 mmol/L are advised for long-term use; concentrations of up to 1.2 mmol/L may be necessary during acute treatment.                                   Detection Limit = 0.1                           <  0.1 indicates None Detected   POCT glycosylated hemoglobin (Hb A1C)     Status: Abnormal   Collection Time: 06/27/21  3:40 PM  Result Value Ref Range   Hemoglobin A1C 8.1 (A) 4.0 - 5.6 %   HbA1c POC (<> result, manual entry)     HbA1c, POC (prediabetic range)     HbA1c, POC (controlled diabetic range)    POCT glycosylated hemoglobin (Hb A1C)     Status: Abnormal   Collection Time: 08/03/21  3:24 PM  Result Value Ref Range   Hemoglobin A1C 8.5 (A) 4.0 - 5.6 %   HbA1c POC (<> result, manual entry)     HbA1c, POC (prediabetic range)     HbA1c, POC (controlled diabetic range)       Assessment and Plan :   PDMP not reviewed this encounter.  1. Viral respiratory illness   2. Cough   3. Chest congestion   4. Type 2 diabetes mellitus treated without insulin ([place])     COVID-19 testing pending, recommended supportive care.  Patient has clear cardiopulmonary exam, negative chest x-ray.  Emphasized need for better diabetic management, follow-up with PCP as soon as possible.  Counseled patient on potential for adverse effects with medications prescribed/recommended today, ER and return-to-clinic precautions discussed, patient verbalized understanding.    Jaynee Eagles, PA-C 08/12/21 1354

## 2021-08-13 LAB — SARS-COV-2, NAA 2 DAY TAT

## 2021-08-13 LAB — NOVEL CORONAVIRUS, NAA: SARS-CoV-2, NAA: DETECTED — AB

## 2021-08-22 ENCOUNTER — Ambulatory Visit: Payer: 59 | Admitting: Dietician

## 2021-09-02 ENCOUNTER — Other Ambulatory Visit: Payer: Self-pay | Admitting: Family

## 2021-10-03 ENCOUNTER — Ambulatory Visit: Payer: 59 | Admitting: Family

## 2021-10-19 ENCOUNTER — Encounter: Payer: Self-pay | Admitting: Neurology

## 2021-10-19 ENCOUNTER — Ambulatory Visit (INDEPENDENT_AMBULATORY_CARE_PROVIDER_SITE_OTHER): Payer: 59 | Admitting: Neurology

## 2021-10-19 VITALS — BP 109/75 | HR 76 | Ht 71.75 in | Wt 214.6 lb

## 2021-10-19 DIAGNOSIS — E663 Overweight: Secondary | ICD-10-CM

## 2021-10-19 DIAGNOSIS — R0683 Snoring: Secondary | ICD-10-CM | POA: Diagnosis not present

## 2021-10-19 DIAGNOSIS — G4700 Insomnia, unspecified: Secondary | ICD-10-CM | POA: Diagnosis not present

## 2021-10-19 DIAGNOSIS — F39 Unspecified mood [affective] disorder: Secondary | ICD-10-CM

## 2021-10-19 DIAGNOSIS — R5383 Other fatigue: Secondary | ICD-10-CM

## 2021-10-19 DIAGNOSIS — R351 Nocturia: Secondary | ICD-10-CM

## 2021-10-19 NOTE — Patient Instructions (Addendum)
Based on your symptoms and your exam I believe we should look for an underlying organic sleep disorder, such as obstructive sleep apnea (OSA) with a sleep study.  If you have more than mild OSA, I will likely recommend you consider treatment with CPAP. Please remember, the risks and ramifications of moderate to severe obstructive sleep apnea or OSA are: Cardiovascular disease, including congestive heart failure, stroke, difficult to control hypertension, arrhythmias, and even type 2 diabetes has been linked to untreated OSA. Sleep apnea causes disruption of sleep and sleep deprivation in most cases, which, in turn, can cause recurrent headaches, problems with memory, mood, concentration, focus, and vigilance. Most people with untreated sleep apnea report excessive daytime sleepiness, which can affect their ability to drive. Please do not drive if you feel sleepy.   We will call you after your sleep study to advise about the results (most likely, you will hear from one of our nurses).    Our sleep lab administrative assistant, will call you to schedule your sleep study. If you don't hear back from her by about 2 weeks from now, please feel free to call her at [phone]. This is her direct line and please leave a message with your phone number to call back if you get the voicemail box. She will call back as soon as possible.   Your chronic insomnia may be well treated with cognitive behavioral therapy (CBT-I). Please talk to Ricky Stabs or your mental health provider about it and a potential referral to a psychologist.  Please try to gradually advance your bedtime in 15 minute increments every week. Try Melatonin 3 to 5 mg 1 hour before your projected bedtime to help you sleep and expose yourself to bright light for about 20-30 minutes when you first wake up. Look online for a treatment bright light; they make desk lamps that work as treatment lights as well.

## 2021-10-19 NOTE — Progress Notes (Signed)
Subjective:    Patient ID: Alan Reeves is a 65 y.o. male.  HPI    Star Age, MD, PhD [facility] [address] [place], [place] [address]  Dear Warren Lacy,   I saw your patient, Alan Reeves, upon your kind request in my sleep clinic today for initial consultation of his sleep disorder, in particular, his chronic difficulty chronic difficulty initiating and maintaining sleep.  The patient is unaccompanied today.  As you know, [name] is a 65 year old right-handed gentleman with an underlying medical history of diabetes, hypertension, hyperlipidemia, depression, anxiety, with a diagnosis of bipolar disorder, followed by [facility], and overweight state, who reports an approximately 10 to 15-year difficulty with initiating and maintaining sleep.  He has previously tried trazodone but it did not help and he has tried over-the-counter medications including melatonin up to 5 mg and 10 mg.  It did not help either.  He denies any stress, mood is stable, he reports struggling with severe depression and even suicidality in the distant past.  He has had ECT multiple times in the past in different locations including [place] where he lived before. He endorses daytime tiredness, does not currently take any naps but used to take Naps during the day. He is currently on lithium and Effexor.  He denies any telltale symptoms of restless leg syndrome but does occasionally wake with a muscle twitch or twitching. 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  He was advised to gradually bedtime.  He has seen an ad for different prescription sleep aids including Belsomra and is wondering if he would be a candidate for any of these.  He has not tried any other prescription sleep aid.  His bedtime is generally around 11:30 PM or midnight and rise time around 8.  He is self-employed, works in [employer].  He is divorced for many years,  he has 1 daughter who is 1.  Has 2 children.  He lives alone.  He has a TV in the bedroom around midnight.  He has had fairly stable weight.  He does not drink any caffeine on a day-to-day basis, no alcohol, is a non-smoker.  I reviewed your office note from 08/03/2021.  His Epworth sleepiness score is 1 out of 24, fatigue severity score is 36 out of 63.  He does report snoring, he has nocturia about once or twice per average night, denies recurrent morning headaches.   His Past Medical History Is Significant For: Past Medical History:  Diagnosis Date   Anxiety    Depression    Diabetes mellitus without complication ([place])    Hyperlipidemia    Hypertension     His Past Surgical History Is Significant For: History reviewed. No pertinent surgical history.  His Family History Is Significant For: Family History  Problem Relation Age of Onset   Hypertension Mother    Diabetes Father    Cancer Father     His Social History Is Significant For: Social History   Socioeconomic History   Marital status: Divorced    Spouse name: Not on file   Number of children: Not on file   Years of education: Not on file   Highest education level: Not on file  Occupational History   Not on file  Tobacco Use   Smoking status: Never   Smokeless tobacco: Never  Vaping Use   Vaping Use: Never used  Substance and Sexual Activity  Alcohol use: No   Drug use: No   Sexual activity: Not on file  Other Topics Concern   Not on file  Social History Narrative   Not on file   Social Determinants of Health   Financial Resource Strain: Not on file  Food Insecurity: Not on file  Transportation Needs: Not on file  Physical Activity: Not on file  Stress: Not on file  Social Connections: Not on file    His Allergies Are:  No Known Allergies:   His Current Medications Are:  Outpatient Encounter Medications as of 10/19/2021  Medication Sig   amLODipine (NORVASC) 5 MG tablet Take 1 tablet (5 mg  total) by mouth daily.   glucose blood (TRUE METRIX BLOOD GLUCOSE TEST) test strip Use as instructed   lisinopril (ZESTRIL) 5 MG tablet Take 1 tablet (5 mg total) by mouth daily.   lithium carbonate 300 MG capsule Take 300 mg by mouth 2 (two) times daily.   metFORMIN (GLUCOPHAGE) 1000 MG tablet Take 1 tablet (1,000 mg total) by mouth 2 (two) times daily.   pioglitazone (ACTOS) 45 MG tablet Take 1 tablet by mouth once daily   rosuvastatin (CRESTOR) 20 MG tablet Take 1 tablet (20 mg total) by mouth daily.   venlafaxine (EFFEXOR) 75 MG tablet Take 75 mg by mouth 2 (two) times daily with a meal.   glipiZIDE (GLUCOTROL) 10 MG tablet Take 1 tablet (10 mg total) by mouth 2 (two) times daily.   [DISCONTINUED] Blood Glucose Monitoring Suppl (TRUE METRIX METER) w/Device KIT Use as directed   [DISCONTINUED] cetirizine (ZYRTEC ALLERGY) 10 MG tablet Take 1 tablet (10 mg total) by mouth daily.   [DISCONTINUED] clindamycin (CLEOCIN) 300 MG capsule Take 300 mg by mouth every 12 (twelve) hours.   [DISCONTINUED] HYDROcodone-acetaminophen (NORCO/VICODIN) 5-325 MG tablet Take 1 tablet by mouth every 6 (six) hours as needed.   [DISCONTINUED] promethazine-dextromethorphan (PROMETHAZINE-DM) 6.25-15 MG/5ML syrup Take 5 mLs by mouth at bedtime as needed for cough.   [DISCONTINUED] pseudoephedrine (SUDAFED) 60 MG tablet Take 1 tablet (60 mg total) by mouth every 8 (eight) hours as needed for congestion.   [DISCONTINUED] triamcinolone (KENALOG) 0.1 % Apply 1 application topically 2 (two) times daily.   [DISCONTINUED] TRUEplus Lancets 28G MISC Use as directed   No facility-administered encounter medications on file as of 10/19/2021.  :   Review of Systems:  Out of a complete 14 point review of systems, all are reviewed and negative with the exception of these symptoms as listed below:   Review of Systems  Neurological:        Having issues with sleep, sometimes wakes up then cannot go back to sleep, sometimes cannot  go to sleep. Has restless legs. Started issues with sleep 15 yrs ago.   No witnessed apnea, does not have daytime sleepiness.  ESS 1, FSS 36.   Objective:  Neurological Exam  Physical Exam Physical Examination:   There were no vitals filed for this visit.  General Examination: The patient is a very pleasant 65 y.o. male in no acute distress. He appears well-developed and well-nourished and well groomed.   HEENT: Normocephalic, atraumatic, pupils are equal, round and reactive to light, extraocular tracking is good without limitation to gaze excursion or nystagmus noted. Hearing is grossly intact. Face is symmetric with normal facial animation. Speech is clear with no dysarthria noted. There is no hypophonia. There is no lip, neck/head, jaw or voice tremor. Neck is supple with full range of passive and active  motion. There are no carotid bruits on auscultation. Oropharynx exam reveals: mild mouth dryness, adequate dental hygiene and moderate airway crowding with a Mallampati class IV.  Tonsils and tip of uvula not fully visualized.  He has a minimal overbite.  Neck circumference of 16-1/2 inches.  Tongue protrudes centrally.    Chest: Clear to auscultation without wheezing, rhonchi or crackles noted.  Heart: S1+S2+0, regular and normal without murmurs, rubs or gallops noted.   Abdomen: Soft, non-tender and non-distended with normal bowel sounds appreciated on auscultation.  Extremities: There is no obvious edema in the distal lower extremities bilaterally.   Skin: Warm and dry without trophic changes noted.   Musculoskeletal: exam reveals no obvious joint deformities, tenderness or joint swelling or erythema.   Neurologically:  Mental status: The patient is awake, alert and oriented in all 4 spheres. His immediate and remote memory, attention, language skills and fund of knowledge are appropriate. There is no evidence of aphasia, agnosia, apraxia or anomia. Speech is clear with normal  prosody and enunciation. Thought process is linear. Mood is normal and affect is normal.  Cranial nerves II - XII are as described above under HEENT exam.  Motor exam: Normal bulk, strength and tone is noted. There is no tremor, fine motor skills and coordination: grossly intact.  Cerebellar testing: No dysmetria or intention tremor. There is no truncal or gait ataxia.  Sensory exam: intact to light touch in the upper and lower extremities.      Assessment and Plan:  In summary, MACIAH SCHWEIGERT is a very pleasant 65 y.o.-year old male with an underlying medical history of diabetes, hypertension, hyperlipidemia, depression, anxiety, with a diagnosis of bipolar disorder, followed by [facility] mental health, and overweight state, who presents for evaluation of his chronic sleep disturbance, particularly difficulty with sleep onset and sleep maintenance.  He does endorse daytime tiredness but does not currently take any naps.  We talked about evaluation for an underlying organic sleep disturbance.  The most common sleep disorder we see in sleep specialty is obstructive sleep apnea.  He does endorse snoring and nocturia and has a narrow airway.  We talked about his tendency to go to bed late and wake up late.  He is encouraged to try a therapeutic treatment bright light in the mornings.  He could try melatonin again at night.  He is advised that most prescription sleep aids are only recommended for temporary use or short-term use not for chronic sleep difficulty.  He is encouraged to talk to you or his mental health provider about a referral to a psychologist to consider cognitive behavioral therapy for insomnia. I had a long chat with the patient about my findings and the diagnosis of OSA, its prognosis and treatment options. We talked about medical treatments, surgical interventions and non-pharmacological approaches. I explained in particular the risks and ramifications of untreated moderate to severe OSA,  especially with respect to developing cardiovascular disease down the Road, including congestive heart failure, difficult to treat hypertension, cardiac arrhythmias, or stroke. Even type 2 diabetes has, in part, been linked to untreated OSA. Symptoms of untreated OSA include daytime sleepiness, memory problems, mood irritability and mood disorder such as depression and anxiety, lack of energy, as well as recurrent headaches, especially morning headaches. We talked about trying to maintain a healthy lifestyle in general, as well as the importance of weight control. We also talked about the importance of good sleep hygiene. I recommended the following at this time: sleep study.  I outlined the difference between weekly attended sleep study versus home sleep test.  He may have a change in his insurance by end of December this year. I explained the sleep test procedure to the patient and also outlined possible surgical and non-surgical treatment options of OSA, including the use of a custom-made dental device (which would require a referral to a specialist dentist or oral surgeon), upper airway surgical options, such as traditional UPPP or a novel less invasive surgical option in the form of Inspire hypoglossal nerve stimulation (which would involve a referral to an ENT surgeon). I also explained the CPAP treatment option to the patient, who indicated that he would be willing to try CPAP PAP therapy, if the need arises.  We will think about the discussion after treatment options after testing and we will keep him posted as to his test results by phone call as well.  We will plan on follow-up in this clinic accordingly.  I answered all his questions today and he was in agreement.   Thank you very much for allowing me to participate in the care of this nice patient. If I can be of any further assistance to you please do not hesitate to call me at [phone].  Sincerely,   Star Age, MD, PhD

## 2021-10-23 NOTE — Progress Notes (Signed)
Patient ID: Alan Reeves, male    DOB: Sep 19, 1956  MRN: [id number]  CC: Diabetes Follow-Up  Subjective: Alan Reeves is a 65 y.o. male who presents for diabetes follow-up.   His concerns today include:   DIABETES TYPE 2 FOLLOW-UP: 08/03/2021: - Hemoglobin A1c not at goal today at 8.5%, goal < 7%. This is increased from previous hemoglobin A1c of 8.1% on 06/27/2021.  - Continue Metformin and Glipizide as prescribed.  - Begin Dapagliflozin Propanediol 5 mg daily as prescribed.Patient provided with office samples.  10/26/2021: Doing well on current regimen. No side effects. No issues/concerns.   2. HYPERTENSION FOLLOW-UP: 06/27/2021: - Continue Amlodipine and Lisinopril as prescribed.   10/26/2021: Doing well on current regimen. No side effects. No issues/concerns. Denies chest pain and shortness of breath.   3. CHOLESTEROL FOLLOW-UP: Doing well on Rosuvastatin. Reports had lunch prior to today's appointment country style steak and mashed potatoes with gravy.   4. LITHIUM LEVELS: Reports [facility] requesting lithium levels be obtained in primary care and sent to their office per Alemu Mengistu, NP.   5. FINANCIAL CONCERNS: Reports notified from [facility] that coverage is ending December 2022. Not sure of next steps.   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   Diabetes mellitus without complication (HCC) 10/08/2017   Keratoconjunctivitis sicca of both eyes not specified as Sjogren's 10/08/2017   Long term current use of oral hypoglycemic drug 10/08/2017   Nuclear sclerotic cataract of both eyes 10/08/2017   Presbyopia of both eyes 10/08/2017   Vitreous floaters, bilateral 10/08/2017   Benign paroxysmal positional vertigo 11/15/2016   Impacted cerumen of left ear 11/15/2016   Needs flu shot 11/15/2016   Hyperlipemia 07/07/2016    Overweight (BMI 25.0-29.9) 07/07/2016   Essential hypertension 04/05/2016   Type 2 diabetes mellitus with hyperglycemia (HCC) 04/05/2016   Frozen shoulder 09/24/2014   Shoulder impingement syndrome 09/24/2014     Current Outpatient Medications on File Prior to Visit  Medication Sig Dispense Refill   glucose blood (TRUE METRIX BLOOD GLUCOSE TEST) test strip Use as instructed 100 each 12   lithium carbonate 300 MG capsule Take 300 mg by mouth 2 (two) times daily.     rosuvastatin (CRESTOR) 20 MG tablet Take 1 tablet (20 mg total) by mouth daily. 120 tablet 0   venlafaxine (EFFEXOR) 75 MG tablet Take 75 mg by mouth 2 (two) times daily with a meal.     No current facility-administered medications on file prior to visit.    No Known Allergies  Social History   Socioeconomic History   Marital status: Divorced    Spouse name: Not on file   Number of children: Not on file   Years of education: Not on file   Highest education level: Not on file  Occupational History   Not on file  Tobacco Use   Smoking status: Never   Smokeless tobacco: Never  Vaping Use   Vaping Use: Never used  Substance and Sexual Activity   Alcohol use: No   Drug use: No   Sexual activity: Not on file  Other Topics Concern   Not on file  Social History Narrative   Not on file   Social Determinants of Health   Financial Resource Strain: Not on file  Food Insecurity: Not on file  Transportation Needs: Not on file  Physical Activity: Not  on file  Stress: Not on file  Social Connections: Not on file  Intimate Partner Violence: Not on file    Family History  Problem Relation Age of Onset   Hypertension Mother    Diabetes Father    Cancer Father     No past surgical history on file.  ROS: Review of Systems Negative except as stated above  PHYSICAL EXAM: BP 127/85   Pulse 65   Temp 98.3 F (36.8 C)   Resp 18   Ht 5' 11.42" (1.814 m)   Wt 212 lb (96.2 kg)   SpO2 96%   BMI 29.22 kg/m    Physical Exam HENT:     Head: Normocephalic and atraumatic.  Eyes:     Extraocular Movements: Extraocular movements intact.     Conjunctiva/sclera: Conjunctivae normal.     Pupils: Pupils are equal, round, and reactive to light.  Cardiovascular:     Rate and Rhythm: Normal rate and regular rhythm.     Pulses: Normal pulses.     Heart sounds: Normal heart sounds.  Pulmonary:     Effort: Pulmonary effort is normal.     Breath sounds: Normal breath sounds.  Musculoskeletal:     Cervical back: Normal range of motion and neck supple.  Neurological:     General: No focal deficit present.     Mental Status: He is alert and oriented to person, place, and time.  Psychiatric:        Mood and Affect: Mood normal.        Behavior: Behavior normal.   Results for orders placed or performed in visit on 10/26/21  POCT glycosylated hemoglobin (Hb A1C)  Result Value Ref Range   Hemoglobin A1C 9.1 (A) 4.0 - 5.6 %   HbA1c POC (<> result, manual entry)     HbA1c, POC (prediabetic range)     HbA1c, POC (controlled diabetic range)      ASSESSMENT AND PLAN: 1. Type 2 diabetes mellitus with hyperglycemia, without long-term current use of insulin (HCC): - Hemoglobin A1c not at goal at 9.1%, goal < 7%. This is increased from previous of 8.5% on 08/03/2021. - Continue Metformin, Glipizide, and Pioglitazone as prescribed.  - Begin Dulaglutide as prescribed.  - Discussed the importance of healthy eating habits, low-carbohydrate diet, low-sugar diet, regular aerobic exercise (at least 150 minutes a week as tolerated) and medication compliance to achieve or maintain control of diabetes. - Follow-up with primary provider in 4 weeks or sooner if needed.  - POCT glycosylated hemoglobin (Hb A1C) - metFORMIN (GLUCOPHAGE) 1000 MG tablet; Take 1 tablet (1,000 mg total) by mouth 2 (two) times daily.  Dispense: 180 tablet; Refill: 0 - pioglitazone (ACTOS) 45 MG tablet; Take 1 tablet (45 mg total) by mouth daily.   Dispense: 90 tablet; Refill: 0 - Dulaglutide 0.75 MG/0.5ML SOPN; Inject 0.75 mg into the skin once a week.  Dispense: 2 mL; Refill: 0 - Insulin Pen Needle (PEN NEEDLES) 31G X 8 MM MISC; UAD  Dispense: 100 each; Refill: 0 - glipiZIDE (GLUCOTROL) 10 MG tablet; Take 1 tablet (10 mg total) by mouth 2 (two) times daily.  Dispense: 180 tablet; Refill: 0  2. Essential hypertension: - Continue Lisinopril and Amlodipine as prescribed.  - Counseled on blood pressure goal of less than 130/80, low-sodium, DASH diet, medication compliance, 150 minutes of moderate intensity exercise per week as tolerated. Discussed medication compliance, adverse effects. - BMP to evaluate kidney function and electrolyte balance. - Follow-up with primary provider  in 3 months or sooner if needed.  - Basic Metabolic Panel - lisinopril (ZESTRIL) 5 MG tablet; Take 1 tablet (5 mg total) by mouth daily.  Dispense: 90 tablet; Refill: 0 - amLODipine (NORVASC) 5 MG tablet; Take 1 tablet (5 mg total) by mouth daily.  Dispense: 90 tablet; Refill: 0  3. Mixed dyslipidemia: - Practice low-fat heart healthy diet and at least 150 minutes of moderate intensity exercise weekly as tolerated.  - Continue Rosuvastatin as prescribed.  - Return within the next 7 days for fasting lipid panel.  - Follow-up with primary provider as scheduled.  - rosuvastatin (CRESTOR) 20 MG tablet; Take 1 tablet (20 mg total) by mouth daily.  Dispense: 120 tablet; Refill: 0 - Lipid Panel; Future  4. Lithium level checked every six months: -Lithium level per patient request to send to [facility].  - Lithium level  5. Financial difficulties: - Patient reports notified from [facility] that coverage is ending December 2022. - Offered patient [place] financial discount/orange card and blue card application. Counseled patient will need to have an appointment with the financial counselor for processing of documentation. Patient agreeable.     Patient  was given the opportunity to ask questions.  Patient verbalized understanding of the plan and was able to repeat key elements of the plan. Patient was given clear instructions to go to Emergency Department or return to medical center if symptoms don't improve, worsen, or new problems develop.The patient verbalized understanding.   Orders Placed This Encounter  Procedures   Basic Metabolic Panel   Lithium level   POCT glycosylated hemoglobin (Hb A1C)     Requested Prescriptions   Signed Prescriptions Disp Refills   lisinopril (ZESTRIL) 5 MG tablet 90 tablet 0    Sig: Take 1 tablet (5 mg total) by mouth daily.   amLODipine (NORVASC) 5 MG tablet 90 tablet 0    Sig: Take 1 tablet (5 mg total) by mouth daily.   metFORMIN (GLUCOPHAGE) 1000 MG tablet 180 tablet 0    Sig: Take 1 tablet (1,000 mg total) by mouth 2 (two) times daily.   pioglitazone (ACTOS) 45 MG tablet 90 tablet 0    Sig: Take 1 tablet (45 mg total) by mouth daily.   Dulaglutide 0.75 MG/0.5ML SOPN 2 mL 0    Sig: Inject 0.75 mg into the skin once a week.   Insulin Pen Needle (PEN NEEDLES) 31G X 8 MM MISC 100 each 0    Sig: UAD   glipiZIDE (GLUCOTROL) 10 MG tablet 180 tablet 0    Sig: Take 1 tablet (10 mg total) by mouth 2 (two) times daily.    Return for Follow-Up or next available 4 weeks diabetes and 3 months hypertension .  Rema Fendt, NP

## 2021-10-25 ENCOUNTER — Telehealth: Payer: Self-pay | Admitting: Neurology

## 2021-10-25 NOTE — Telephone Encounter (Signed)
LVM for pt to call me back to provide new insurance information.

## 2021-10-26 ENCOUNTER — Other Ambulatory Visit: Payer: Self-pay

## 2021-10-26 ENCOUNTER — Ambulatory Visit (INDEPENDENT_AMBULATORY_CARE_PROVIDER_SITE_OTHER): Payer: 59 | Admitting: Family

## 2021-10-26 VITALS — BP 127/85 | HR 65 | Temp 98.3°F | Resp 18 | Ht 71.42 in | Wt 212.0 lb

## 2021-10-26 DIAGNOSIS — I10 Essential (primary) hypertension: Secondary | ICD-10-CM | POA: Diagnosis not present

## 2021-10-26 DIAGNOSIS — E1165 Type 2 diabetes mellitus with hyperglycemia: Secondary | ICD-10-CM | POA: Diagnosis not present

## 2021-10-26 DIAGNOSIS — Z79899 Other long term (current) drug therapy: Secondary | ICD-10-CM

## 2021-10-26 DIAGNOSIS — Z599 Problem related to housing and economic circumstances, unspecified: Secondary | ICD-10-CM

## 2021-10-26 DIAGNOSIS — E782 Mixed hyperlipidemia: Secondary | ICD-10-CM | POA: Diagnosis not present

## 2021-10-26 LAB — POCT GLYCOSYLATED HEMOGLOBIN (HGB A1C): Hemoglobin A1C: 9.1 % — AB (ref 4.0–5.6)

## 2021-10-26 MED ORDER — ROSUVASTATIN CALCIUM 20 MG PO TABS: 20.0000 mg | ORAL_TABLET | Freq: Every day | ORAL | 0 refills | Status: DC

## 2021-10-26 MED ORDER — GLIPIZIDE 10 MG PO TABS: 10.0000 mg | ORAL_TABLET | Freq: Two times a day (BID) | ORAL | 0 refills | Status: DC

## 2021-10-26 MED ORDER — DULAGLUTIDE 0.75 MG/0.5ML ~~LOC~~ SOAJ: 0.7500 mg | SUBCUTANEOUS | 0 refills | Status: DC

## 2021-10-26 MED ORDER — METFORMIN HCL 1000 MG PO TABS: 1000.0000 mg | ORAL_TABLET | Freq: Two times a day (BID) | ORAL | 0 refills | Status: DC

## 2021-10-26 MED ORDER — LISINOPRIL 5 MG PO TABS: 5.0000 mg | ORAL_TABLET | Freq: Every day | ORAL | 0 refills | Status: DC

## 2021-10-26 MED ORDER — PEN NEEDLES 31G X 8 MM MISC: 0 refills | Status: DC

## 2021-10-26 MED ORDER — AMLODIPINE BESYLATE 5 MG PO TABS: 5.0000 mg | ORAL_TABLET | Freq: Every day | ORAL | 0 refills | Status: DC

## 2021-10-26 MED ORDER — PIOGLITAZONE HCL 45 MG PO TABS: 45.0000 mg | ORAL_TABLET | Freq: Every day | ORAL | 0 refills | Status: DC

## 2021-10-26 NOTE — Progress Notes (Signed)
Pt presents for diabetes follow, needs lithium levels checked and sent to [facility], pt is not taking Actos

## 2021-10-26 NOTE — Progress Notes (Signed)
Diabetes discussed in office.

## 2021-10-27 LAB — BASIC METABOLIC PANEL
BUN/Creatinine Ratio: 13 (ref 10–24)
BUN: 18 mg/dL (ref 8–27)
CO2: 18 mmol/L — ABNORMAL LOW (ref 20–29)
Calcium: 9.3 mg/dL (ref 8.6–10.2)
Chloride: 99 mmol/L (ref 96–106)
Creatinine, Ser: 1.42 mg/dL — ABNORMAL HIGH (ref 0.76–1.27)
Glucose: 428 mg/dL — ABNORMAL HIGH (ref 70–99)
Potassium: 4.5 mmol/L (ref 3.5–5.2)
Sodium: 133 mmol/L — ABNORMAL LOW (ref 134–144)
eGFR: 56 mL/min/{1.73_m2} — ABNORMAL LOW (ref >59)

## 2021-10-27 LAB — LITHIUM LEVEL: Lithium Lvl: 0.5 mmol/L (ref 0.5–1.2)

## 2021-10-27 NOTE — Progress Notes (Signed)
Please call patient with update.   Lithium levels normal.

## 2021-10-27 NOTE — Progress Notes (Signed)
Please call patient with update.   Kidney function not 100% and decreased since 4 months ago. We will recheck at next appointment in 4 weeks.

## 2021-11-01 ENCOUNTER — Ambulatory Visit: Payer: 59

## 2021-11-14 ENCOUNTER — Ambulatory Visit: Payer: 59 | Admitting: Neurology

## 2021-11-14 DIAGNOSIS — R351 Nocturia: Secondary | ICD-10-CM

## 2021-11-14 DIAGNOSIS — R5383 Other fatigue: Secondary | ICD-10-CM

## 2021-11-14 DIAGNOSIS — G4733 Obstructive sleep apnea (adult) (pediatric): Secondary | ICD-10-CM

## 2021-11-14 DIAGNOSIS — R0683 Snoring: Secondary | ICD-10-CM

## 2021-11-14 DIAGNOSIS — G4700 Insomnia, unspecified: Secondary | ICD-10-CM

## 2021-11-14 DIAGNOSIS — E663 Overweight: Secondary | ICD-10-CM

## 2021-11-14 DIAGNOSIS — F39 Unspecified mood [affective] disorder: Secondary | ICD-10-CM

## 2021-11-15 ENCOUNTER — Telehealth: Payer: Self-pay

## 2021-11-15 NOTE — Telephone Encounter (Signed)
Called patient to reschedule sleep study, no answer. LVM.  When I went to download the study from the device there was no study on the device. Patient will need to reschedule and complete Home Sleep Study again.

## 2021-11-20 NOTE — Progress Notes (Signed)
Patient ID: Alan Reeves, male    DOB: 1956/06/10  MRN: [id number]  CC: Diabetes Follow-Up   Subjective: Alan Reeves is a 65 y.o. male who presents for diabetes follow-up.   His concerns today include:   DIABETES TYPE 2 FOLLOW-UP: 10/26/2021: - Hemoglobin A1c not at goal at 9.1%, goal < 7%. This is increased from previous of 8.5% on 08/03/2021. - Continue Metformin, Glipizide, and Pioglitazone as prescribed.  - Begin Dulaglutide as prescribed.  - Follow-up with primary provider in 4 weeks or sooner if needed.   11/23/2021: Doing well on current regimen, no issues/concerns. Home blood sugars 110's-180's.   2. DYSLIPIDEMIA FOLLOW-UP: Doing well on current regimen, no issues/concerns. Fasting today.    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   Diabetes mellitus without complication (HCC) 10/08/2017   Keratoconjunctivitis sicca of both eyes not specified as Sjogren's 10/08/2017   Long term current use of oral hypoglycemic drug 10/08/2017   Nuclear sclerotic cataract of both eyes 10/08/2017   Presbyopia of both eyes 10/08/2017   Vitreous floaters, bilateral 10/08/2017   Benign paroxysmal positional vertigo 11/15/2016   Impacted cerumen of left ear 11/15/2016   Needs flu shot 11/15/2016   Hyperlipemia 07/07/2016   Overweight (BMI 25.0-29.9) 07/07/2016   Essential hypertension 04/05/2016   Type 2 diabetes mellitus with hyperglycemia (HCC) 04/05/2016   Frozen shoulder 09/24/2014   Shoulder impingement syndrome 09/24/2014     Current Outpatient Medications on File Prior to Visit  Medication Sig Dispense Refill   amLODipine (NORVASC) 5 MG tablet Take 1 tablet (5 mg total) by mouth daily. 90 tablet 0   glipiZIDE (GLUCOTROL) 10 MG tablet Take 1 tablet (10 mg total) by mouth 2 (two) times daily. 180 tablet 0   glucose blood (TRUE METRIX BLOOD GLUCOSE  TEST) test strip Use as instructed 100 each 12   Insulin Pen Needle (PEN NEEDLES) 31G X 8 MM MISC UAD 100 each 0   lisinopril (ZESTRIL) 5 MG tablet Take 1 tablet (5 mg total) by mouth daily. 90 tablet 0   lithium carbonate 300 MG capsule Take 300 mg by mouth 2 (two) times daily.     metFORMIN (GLUCOPHAGE) 1000 MG tablet Take 1 tablet (1,000 mg total) by mouth 2 (two) times daily. 180 tablet 0   pioglitazone (ACTOS) 45 MG tablet Take 1 tablet (45 mg total) by mouth daily. 90 tablet 0   rosuvastatin (CRESTOR) 20 MG tablet Take 1 tablet (20 mg total) by mouth daily. 120 tablet 0   venlafaxine (EFFEXOR) 75 MG tablet Take 75 mg by mouth 2 (two) times daily with a meal.     No current facility-administered medications on file prior to visit.    No Known Allergies  Social History   Socioeconomic History   Marital status: Divorced    Spouse name: Not on file   Number of children: Not on file   Years of education: Not on file   Highest education level: Not on file  Occupational History   Not on file  Tobacco Use   Smoking status: Never   Smokeless tobacco: Never  Vaping Use   Vaping Use: Never used  Substance and Sexual Activity   Alcohol use: No   Drug use: No   Sexual activity: Not on file  Other Topics Concern   Not on file  Social History Narrative  Not on file   Social Determinants of Health   Financial Resource Strain: Not on file  Food Insecurity: Not on file  Transportation Needs: Not on file  Physical Activity: Not on file  Stress: Not on file  Social Connections: Not on file  Intimate Partner Violence: Not on file    Family History  Problem Relation Age of Onset   Hypertension Mother    Diabetes Father    Cancer Father     No past surgical history on file.  ROS: Review of Systems Negative except as stated above  PHYSICAL EXAM: BP 128/87 (BP Location: Left Arm, Patient Position: Sitting, Cuff Size: Normal)   Pulse 70   Temp 98.5 F (36.9 C)   Resp  18   Ht 5' 11.42" (1.814 m)   Wt 210 lb 3.2 oz (95.3 kg)   SpO2 97%   BMI 28.98 kg/m    Physical Exam HENT:     Head: Normocephalic and atraumatic.  Eyes:     Extraocular Movements: Extraocular movements intact.     Conjunctiva/sclera: Conjunctivae normal.     Pupils: Pupils are equal, round, and reactive to light.  Cardiovascular:     Rate and Rhythm: Normal rate and regular rhythm.     Pulses: Normal pulses.     Heart sounds: Normal heart sounds.  Pulmonary:     Effort: Pulmonary effort is normal.     Breath sounds: Normal breath sounds.  Musculoskeletal:     Cervical back: Normal range of motion and neck supple.  Neurological:     General: No focal deficit present.     Mental Status: He is alert and oriented to person, place, and time.  Psychiatric:        Mood and Affect: Mood normal.        Behavior: Behavior normal.    Results for orders placed or performed in visit on 11/23/21  POCT glycosylated hemoglobin (Hb A1C)  Result Value Ref Range   Hemoglobin A1C 8.3 (A) 4.0 - 5.6 %   HbA1c POC (<> result, manual entry)     HbA1c, POC (prediabetic range)     HbA1c, POC (controlled diabetic range)      ASSESSMENT AND PLAN: 1. Type 2 diabetes mellitus with hyperglycemia, without long-term current use of insulin (HCC): - Hemoglobin A1c today 8.3%. This is improved from previous 9.1% on 10/26/2021. - Continue Metformin, Glipizide, and Pioglitazone as prescribed. No refills needed as of present.  - Continue Dulaglutide as prescribed.  - Discussed the importance of healthy eating habits, low-carbohydrate diet, low-sugar diet, regular aerobic exercise (at least 150 minutes a week as tolerated) and medication compliance to achieve or maintain control of diabetes. - Will update BMP today.  - Follow-up with primary provider in 3 months or sooner if needed.  - Basic Metabolic Panel - POCT glycosylated hemoglobin (Hb A1C) - Dulaglutide 0.75 MG/0.5ML SOPN; Inject 0.75 mg into the  skin once a week.  Dispense: 6 mL; Refill: 0  2. Mixed dyslipidemia: - Continue Rosuvastatin as prescribed. No refills needed as of present.  - Will update lipid panel today.  - Follow-up with primary provider as scheduled. - Lipid Panel   Patient was given the opportunity to ask questions.  Patient verbalized understanding of the plan and was able to repeat key elements of the plan. Patient was given clear instructions to go to Emergency Department or return to medical center if symptoms don't improve, worsen, or new problems develop.The patient verbalized understanding.  Orders Placed This Encounter  Procedures   Basic Metabolic Panel   Lipid Panel   POCT glycosylated hemoglobin (Hb A1C)    Requested Prescriptions   Signed Prescriptions Disp Refills   Dulaglutide 0.75 MG/0.5ML SOPN 6 mL 0    Sig: Inject 0.75 mg into the skin once a week.    Return in about 3 months (around 02/21/2022) for Follow-Up or next available diabetes .  Rema Fendt, NP

## 2021-11-23 ENCOUNTER — Ambulatory Visit (INDEPENDENT_AMBULATORY_CARE_PROVIDER_SITE_OTHER): Payer: 59 | Admitting: Family

## 2021-11-23 ENCOUNTER — Other Ambulatory Visit: Payer: Self-pay

## 2021-11-23 VITALS — BP 128/87 | HR 70 | Temp 98.5°F | Resp 18 | Ht 71.42 in | Wt 210.2 lb

## 2021-11-23 DIAGNOSIS — E1165 Type 2 diabetes mellitus with hyperglycemia: Secondary | ICD-10-CM

## 2021-11-23 DIAGNOSIS — E782 Mixed hyperlipidemia: Secondary | ICD-10-CM | POA: Diagnosis not present

## 2021-11-23 LAB — POCT GLYCOSYLATED HEMOGLOBIN (HGB A1C): Hemoglobin A1C: 8.3 % — AB (ref 4.0–5.6)

## 2021-11-23 MED ORDER — DULAGLUTIDE 0.75 MG/0.5ML ~~LOC~~ SOPN
0.7500 mg | PEN_INJECTOR | SUBCUTANEOUS | 0 refills | Status: AC
Start: 2021-11-23 — End: 2022-02-21

## 2021-11-23 NOTE — Progress Notes (Signed)
Pt presents for diabetes follow-up  

## 2021-11-23 NOTE — Progress Notes (Signed)
Diabetes discussed in office.

## 2021-11-24 ENCOUNTER — Other Ambulatory Visit: Payer: Self-pay | Admitting: Family

## 2021-11-24 DIAGNOSIS — N1831 Chronic kidney disease, stage 3a: Secondary | ICD-10-CM

## 2021-11-24 DIAGNOSIS — N189 Chronic kidney disease, unspecified: Secondary | ICD-10-CM | POA: Insufficient documentation

## 2021-11-24 LAB — BASIC METABOLIC PANEL
BUN/Creatinine Ratio: 12 (ref 10–24)
BUN: 18 mg/dL (ref 8–27)
CO2: 18 mmol/L — ABNORMAL LOW (ref 20–29)
Calcium: 9.6 mg/dL (ref 8.6–10.2)
Chloride: 94 mmol/L — ABNORMAL LOW (ref 96–106)
Creatinine, Ser: 1.46 mg/dL — ABNORMAL HIGH (ref 0.76–1.27)
Glucose: 234 mg/dL — ABNORMAL HIGH (ref 70–99)
Potassium: 4.3 mmol/L (ref 3.5–5.2)
Sodium: 132 mmol/L — ABNORMAL LOW (ref 134–144)
eGFR: 54 mL/min/{1.73_m2} — ABNORMAL LOW (ref >59)

## 2021-11-24 LAB — LIPID PANEL
Chol/HDL Ratio: 2.7 ratio (ref 0.0–5.0)
Cholesterol, Total: 87 mg/dL — ABNORMAL LOW (ref 100–199)
HDL: 32 mg/dL — ABNORMAL LOW (ref >39)
LDL Chol Calc (NIH): 20 mg/dL (ref 0–99)
Triglycerides: 230 mg/dL — ABNORMAL HIGH (ref 0–149)
VLDL Cholesterol Cal: 35 mg/dL (ref 5–40)

## 2021-11-24 NOTE — Progress Notes (Signed)
Please call patient with update.   Cholesterol overall improved since 9 months ago. Continue Rosuvastatin for cholesterol maintenance and practice low-salt heart healthy diet.  Referral to Nephrology for decreased kidney function. Their office should call patient within 2 weeks with appointment details.

## 2021-11-28 ENCOUNTER — Ambulatory Visit (INDEPENDENT_AMBULATORY_CARE_PROVIDER_SITE_OTHER): Payer: 59 | Admitting: Neurology

## 2021-11-28 ENCOUNTER — Other Ambulatory Visit: Payer: Self-pay | Admitting: Family

## 2021-11-28 DIAGNOSIS — G4700 Insomnia, unspecified: Secondary | ICD-10-CM | POA: Diagnosis not present

## 2021-11-28 DIAGNOSIS — G4733 Obstructive sleep apnea (adult) (pediatric): Secondary | ICD-10-CM | POA: Diagnosis not present

## 2021-11-28 DIAGNOSIS — R351 Nocturia: Secondary | ICD-10-CM

## 2021-11-28 DIAGNOSIS — E663 Overweight: Secondary | ICD-10-CM | POA: Diagnosis not present

## 2021-11-28 DIAGNOSIS — R0683 Snoring: Secondary | ICD-10-CM

## 2021-11-28 DIAGNOSIS — E1165 Type 2 diabetes mellitus with hyperglycemia: Secondary | ICD-10-CM

## 2021-11-28 DIAGNOSIS — R5383 Other fatigue: Secondary | ICD-10-CM

## 2021-11-28 DIAGNOSIS — F39 Unspecified mood [affective] disorder: Secondary | ICD-10-CM

## 2021-11-29 NOTE — Progress Notes (Signed)
°  ° °  [facility]  HOME SLEEP TEST (Watch PAT) REPORT  STUDY DATE: 11/28/2021  DOB: 08/31/1956  MRN: [id number]  ORDERING CLINICIAN: Huston Foley, MD, PhD   REFERRING CLINICIAN: Rema Fendt, NP   CLINICAL INFORMATION/HISTORY:  65 year old right-handed gentleman with an underlying medical history of diabetes, hypertension, hyperlipidemia, depression, anxiety, with a diagnosis of bipolar disorder, followed by [facility] mental health, and overweight state, who reports an approximately 10 to 15-year difficulty with initiating and maintaining sleep.    Epworth sleepiness score: 1/24.  BMI: 29 kg/m  FINDINGS:   Sleep Summary:   Total Recording Time (hours, min): 9 hours, 59 minutes  Total Sleep Time (hours, min):  7 hours, 59 minutes   Percent REM (%):    13.9%   Respiratory Indices:   Calculated pAHI (per hour):  9/hour         REM pAHI:    2.7/hour       NREM pAHI: 10/hour  Oxygen Saturation Statistics:    Oxygen Saturation (%) Mean: 93%   Minimum oxygen saturation (%):                 86%   O2 Saturation Range (%): 86-99%    O2 Saturation (minutes) <=88%: 0.1 min  Pulse Rate Statistics:   Pulse Mean (bpm):    61/min    Pulse Range (47-87/min)   IMPRESSION: OSA (obstructive sleep apnea)  RECOMMENDATION:  This home sleep test demonstrates overall mild obstructive sleep apnea with a total AHI of 9/hour and O2 nadir of 86%.  Intermittent mild to moderate snoring was detected, at times in the louder range.  Given the patient's medical history and sleep related complaints, treatment with positive airway pressure is recommended. This can be achieved in the form of autoPAP trial/titration at home. A  full night CPAP titration study will help with proper treatment settings and mask fitting if needed. Alternative treatments include weight loss along with avoidance of the supine sleep position, or an oral appliance in appropriate candidates.   Please  note that untreated obstructive sleep apnea may carry additional perioperative morbidity. Patients with significant obstructive sleep apnea should receive perioperative PAP therapy and the surgeons and particularly the anesthesiologist should be informed of the diagnosis and the severity of the sleep disordered breathing. The patient should be cautioned not to drive, work at heights, or operate dangerous or heavy equipment when tired or sleepy. Review and reiteration of good sleep hygiene measures should be pursued with any patient. Other causes of the patient's symptoms, including circadian rhythm disturbances, an underlying mood disorder, medication effect and/or an underlying medical problem cannot be ruled out based on this test. Clinical correlation is recommended.   The patient and his referring provider will be notified of the test results. The patient will be seen in follow up in sleep clinic at [facility].  I certify that I have reviewed the raw data recording prior to the issuance of this report in accordance with the standards of the American Academy of Sleep Medicine (AASM).  INTERPRETING PHYSICIAN:   Huston Foley, MD, PhD  Board Certified in Neurology and Sleep Medicine  [facility] [address], [address] [place], [place] [address] [phone]

## 2021-12-13 NOTE — Procedures (Signed)
°  ° °  [facility]  HOME SLEEP TEST (Watch PAT) REPORT  STUDY DATE: 11/28/2021  DOB: 08/31/1956  MRN: [id number]  ORDERING CLINICIAN: Huston Foley, MD, PhD   REFERRING CLINICIAN: Rema Fendt, NP   CLINICAL INFORMATION/HISTORY:  66 year old right-handed gentleman with an underlying medical history of diabetes, hypertension, hyperlipidemia, depression, anxiety, with a diagnosis of bipolar disorder, followed by [facility] mental health, and overweight state, who reports an approximately 10 to 15-year difficulty with initiating and maintaining sleep.    Epworth sleepiness score: 1/24.  BMI: 29 kg/m  FINDINGS:   Sleep Summary:   Total Recording Time (hours, min): 9 hours, 59 minutes  Total Sleep Time (hours, min):  7 hours, 59 minutes   Percent REM (%):    13.9%   Respiratory Indices:   Calculated pAHI (per hour):  9/hour         REM pAHI:    2.7/hour       NREM pAHI: 10/hour  Oxygen Saturation Statistics:    Oxygen Saturation (%) Mean: 93%   Minimum oxygen saturation (%):                 86%   O2 Saturation Range (%): 86-99%    O2 Saturation (minutes) <=88%: 0.1 min  Pulse Rate Statistics:   Pulse Mean (bpm):    61/min    Pulse Range (47-87/min)   IMPRESSION: OSA (obstructive sleep apnea)  RECOMMENDATION:  This home sleep test demonstrates overall mild obstructive sleep apnea with a total AHI of 9/hour and O2 nadir of 86%.  Intermittent mild to moderate snoring was detected, at times in the louder range.  Given the patient's medical history and sleep related complaints, treatment with positive airway pressure is recommended. This can be achieved in the form of autoPAP trial/titration at home. A  full night CPAP titration study will help with proper treatment settings and mask fitting if needed. Alternative treatments include weight loss along with avoidance of the supine sleep position, or an oral appliance in appropriate candidates.   Please  note that untreated obstructive sleep apnea may carry additional perioperative morbidity. Patients with significant obstructive sleep apnea should receive perioperative PAP therapy and the surgeons and particularly the anesthesiologist should be informed of the diagnosis and the severity of the sleep disordered breathing. The patient should be cautioned not to drive, work at heights, or operate dangerous or heavy equipment when tired or sleepy. Review and reiteration of good sleep hygiene measures should be pursued with any patient. Other causes of the patient's symptoms, including circadian rhythm disturbances, an underlying mood disorder, medication effect and/or an underlying medical problem cannot be ruled out based on this test. Clinical correlation is recommended.   The patient and his referring provider will be notified of the test results. The patient will be seen in follow up in sleep clinic at [facility].  I certify that I have reviewed the raw data recording prior to the issuance of this report in accordance with the standards of the American Academy of Sleep Medicine (AASM).  INTERPRETING PHYSICIAN:   Huston Foley, MD, PhD  Board Certified in Neurology and Sleep Medicine  [facility] [address], [address] [place], [place] [address] [phone]

## 2021-12-14 ENCOUNTER — Telehealth: Payer: Self-pay

## 2021-12-14 DIAGNOSIS — G4733 Obstructive sleep apnea (adult) (pediatric): Secondary | ICD-10-CM

## 2021-12-14 DIAGNOSIS — R0683 Snoring: Secondary | ICD-10-CM

## 2021-12-14 NOTE — Telephone Encounter (Signed)
I called pt. No answer, left a message asking pt to call me back.   

## 2021-12-14 NOTE — Telephone Encounter (Signed)
-----   Message from Huston Foley, MD sent at 12/13/2021 11:32 AM EST ----- Patient referred by his primary care nurse practitioner, I saw him on 10/19/2021, he had a home sleep test on 11/28/2021.    Please call and notify the patient that the recent home sleep test showed obstructive sleep apnea. OSA is overall mild, but worth treating to see if he feels better after treatment. To that end I recommend treatment for this in the form of autoPAP, which means, that we don't have to bring him in for a sleep study with CPAP, but will let him try an autoPAP machine at home, through a DME company (of his choice, or as per insurance requirement). The DME representative will educate him on how to use the machine, how to put the mask on, etc. I have placed an order in the chart. Please send referral, talk to patient, send report to referring MD. We will need a FU in sleep clinic for 10 weeks post-PAP set up, please arrange that with me or one of our NPs. Thanks,   Huston Foley, MD, PhD [facility])

## 2021-12-15 NOTE — Telephone Encounter (Signed)
I called pt. No answer, left a message asking pt to call me back.   

## 2021-12-15 NOTE — Telephone Encounter (Signed)
Pt called me back and we discussed the result of sleep study. Pt verbalized understanding of results. Pt had decided to to forgo treatment in the form of autopap at this time, but would like to try the oral appliance.  I have placed referral for this in epic. Pt will call us back if any issue come up in regards to this request.

## 2021-12-15 NOTE — Addendum Note (Signed)
Addended by: Ann Maki on: 12/15/2021 02:39 PM   Modules accepted: Orders

## 2021-12-15 NOTE — Telephone Encounter (Signed)
Noted, thank you

## 2021-12-15 NOTE — Telephone Encounter (Signed)
Referral form for Dr. Katz's office filled out & placed in nurse pod for MD signature. °

## 2021-12-21 NOTE — Telephone Encounter (Signed)
Faxed signed referral form to Dr. Kae Heller office. Phone: [phone].

## 2022-02-06 ENCOUNTER — Ambulatory Visit: Payer: 59 | Admitting: Family

## 2022-02-18 NOTE — Progress Notes (Signed)
Patient ID: BLAIZE EPPLE, male    DOB: Jul 25, 1956  MRN: [id number]  CC: Diabetes Follow-Up  Subjective: Alan Reeves is a 66 y.o. male who presents for diabetes follow-up.   DID YOU HEAR FROM KIDNEY REFERRAL?  His concerns today include:  DIABETES TYPE 2 FOLLOW-UP: 11/23/2021: - Continue Metformin, Glipizide, and Pioglitazone as prescribed. No refills needed as of present.  - Continue Dulaglutide as prescribed.   02/21/2022:  2. HYPERTENSION FOLLOW-UP: 10/26/2021: - Continue Lisinopril and Amlodipine as prescribed.   02/21/2022:  3. HYPERLIPIDEMIA FOLLOW-UP: 11/23/2021: - Continue Rosuvastatin as prescribed. No refills needed as of present.   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   Diabetes mellitus without complication (HCC) 10/08/2017   Keratoconjunctivitis sicca of both eyes not specified as Sjogren's 10/08/2017   Long term current use of oral hypoglycemic drug 10/08/2017   Nuclear sclerotic cataract of both eyes 10/08/2017   Presbyopia of both eyes 10/08/2017   Vitreous floaters, bilateral 10/08/2017   Benign paroxysmal positional vertigo 11/15/2016   Impacted cerumen of left ear 11/15/2016   Needs flu shot 11/15/2016   Hyperlipemia 07/07/2016   Overweight (BMI 25.0-29.9) 07/07/2016   Essential hypertension 04/05/2016   Type 2 diabetes mellitus with hyperglycemia (HCC) 04/05/2016   Frozen shoulder 09/24/2014   Shoulder impingement syndrome 09/24/2014     Current Outpatient Medications on File Prior to Visit  Medication Sig Dispense Refill   amLODipine (NORVASC) 5 MG tablet Take 1 tablet (5 mg total) by mouth daily. 90 tablet 0   Dulaglutide 0.75 MG/0.5ML SOPN Inject 0.75 mg into the skin once a week. 6 mL 0   glipiZIDE (GLUCOTROL) 10 MG tablet Take 1 tablet (10 mg total) by  mouth 2 (two) times daily. 180 tablet 0   glucose blood (TRUE METRIX BLOOD GLUCOSE TEST) test strip Use as instructed 100 each 12   Insulin Pen Needle (PEN NEEDLES) 31G X 8 MM MISC UAD 100 each 0   lisinopril (ZESTRIL) 5 MG tablet Take 1 tablet (5 mg total) by mouth daily. 90 tablet 0   lithium carbonate 300 MG capsule Take 300 mg by mouth 2 (two) times daily.     metFORMIN (GLUCOPHAGE) 1000 MG tablet Take 1 tablet (1,000 mg total) by mouth 2 (two) times daily. 180 tablet 0   pioglitazone (ACTOS) 45 MG tablet Take 1 tablet (45 mg total) by mouth daily. 90 tablet 0   rosuvastatin (CRESTOR) 20 MG tablet Take 1 tablet (20 mg total) by mouth daily. 120 tablet 0   venlafaxine (EFFEXOR) 75 MG tablet Take 75 mg by mouth 2 (two) times daily with a meal.     No current facility-administered medications on file prior to visit.    No Known Allergies  Social History   Socioeconomic History   Marital status: Divorced    Spouse name: Not on file   Number of children: Not on file   Years of education: Not on file   Highest education level: Not on file  Occupational History   Not on file  Tobacco Use   Smoking status: Never   Smokeless tobacco: Never  Vaping Use   Vaping Use: Never used  Substance and Sexual Activity   Alcohol use: No   Drug use: No   Sexual activity: Not on file  Other Topics Concern  Not on file  Social History Narrative   Not on file   Social Determinants of Health   Financial Resource Strain: Not on file  Food Insecurity: Not on file  Transportation Needs: Not on file  Physical Activity: Not on file  Stress: Not on file  Social Connections: Not on file  Intimate Partner Violence: Not on file    Family History  Problem Relation Age of Onset   Hypertension Mother    Diabetes Father    Cancer Father     No past surgical history on file.  ROS: Review of Systems Negative except as stated above  PHYSICAL EXAM: There were no vitals taken for this  visit.  Physical Exam  {male adult master:310786} {male adult master:310785}  CMP Latest Ref Rng & Units 11/23/2021 10/26/2021 06/27/2021  Glucose 70 - 99 mg/dL 354(S) 568(L) 275(T)  BUN 8 - 27 mg/dL 18 18 15   Creatinine 0.76 - 1.27 mg/dL ) 7.00(F) 7.49(S)  Sodium 134 - 144 mmol/L 132(L) 133(L) 136  Potassium 3.5 - 5.2 mmol/L 4.3 4.5 4.5  Chloride 96 - 106 mmol/L 94(L) 99 101  CO2 20 - 29 mmol/L 18(L) 18(L) 18(L)  Calcium 8.6 - 10.2 mg/dL 9.6 9.3 9.4  Total Protein 6.0 - 8.5 g/dL - - -  Total Bilirubin 0.0 - 1.2 mg/dL - - -  Alkaline Phos 44 - 121 IU/L - - -  AST 0 - 40 IU/L - - -  ALT 0 - 44 IU/L - - -   Lipid Panel     Component Value Date/Time   CHOL 87 (L) 11/23/2021 1507   TRIG 230 (H) 11/23/2021 1507   HDL 32 (L) 11/23/2021 1507   CHOLHDL 2.7 11/23/2021 1507   CHOLHDL 4.8 03/31/2014 1609   VLDL 73 (H) 03/31/2014 1609   LDLCALC 20 11/23/2021 1507    CBC    Component Value Date/Time   WBC 7.2 02/18/2021 1529   WBC 8.4 03/31/2014 1609   RBC 4.88 02/18/2021 1529   RBC 5.12 03/31/2014 1609   HGB 14.0 02/18/2021 1529   HCT 42.9 02/18/2021 1529   PLT 333 02/18/2021 1529   MCV 88 02/18/2021 1529   MCH 28.7 02/18/2021 1529   MCH 29.5 03/31/2014 1609   MCHC 32.6 02/18/2021 1529   MCHC 34.6 03/31/2014 1609   RDW 13.0 02/18/2021 1529   LYMPHSABS 2.3 03/31/2014 1609   MONOABS 0.7 03/31/2014 1609   EOSABS 0.3 03/31/2014 1609   BASOSABS 0.1 03/31/2014 1609    ASSESSMENT AND PLAN:  There are no diagnoses linked to this encounter.   Patient was given the opportunity to ask questions.  Patient verbalized understanding of the plan and was able to repeat key elements of the plan. Patient was given clear instructions to go to Emergency Department or return to medical center if symptoms don't improve, worsen, or new problems develop.The patient verbalized understanding.   No orders of the defined types were placed in this encounter.    Requested  Prescriptions    No prescriptions requested or ordered in this encounter    No follow-ups on file.  04/02/2014, NP

## 2022-02-21 ENCOUNTER — Encounter: Payer: 59 | Admitting: Family

## 2022-02-21 DIAGNOSIS — I10 Essential (primary) hypertension: Secondary | ICD-10-CM

## 2022-02-21 DIAGNOSIS — E119 Type 2 diabetes mellitus without complications: Secondary | ICD-10-CM

## 2022-02-21 DIAGNOSIS — E782 Mixed hyperlipidemia: Secondary | ICD-10-CM

## 2022-02-26 ENCOUNTER — Other Ambulatory Visit: Payer: Self-pay | Admitting: Family

## 2022-02-26 DIAGNOSIS — E1165 Type 2 diabetes mellitus with hyperglycemia: Secondary | ICD-10-CM

## 2022-02-27 ENCOUNTER — Other Ambulatory Visit: Payer: Self-pay | Admitting: Family

## 2022-02-27 DIAGNOSIS — E1165 Type 2 diabetes mellitus with hyperglycemia: Secondary | ICD-10-CM

## 2022-02-27 LAB — BASIC METABOLIC PANEL
BUN: 19 (ref 4–21)
CO2: 25 — AB (ref 13–22)
Chloride: 104 (ref 99–108)
Creatinine: 1.6 — AB (ref 0.6–1.3)
Glucose: 316
Potassium: 4.6 meq/L (ref 3.5–5.1)
Sodium: 135 — AB (ref 137–147)

## 2022-02-27 LAB — COMPREHENSIVE METABOLIC PANEL
Albumin: 4.6 (ref 3.5–5.0)
Calcium: 9.5 (ref 8.7–10.7)
eGFR: 49

## 2022-02-27 LAB — VITAMIN D 25 HYDROXY (VIT D DEFICIENCY, FRACTURES): Vit D, 25-Hydroxy: 16

## 2022-02-27 LAB — CBC AND DIFFERENTIAL
HCT: 38 — AB (ref 41–53)
Hemoglobin: 12.3 — AB (ref 13.5–17.5)
Neutrophils Absolute: 6.4
Platelets: 303 10*3/uL (ref 150–400)
WBC: 10.3

## 2022-02-27 LAB — CBC: RBC: 4.18 (ref 3.87–5.11)

## 2022-02-28 ENCOUNTER — Other Ambulatory Visit: Payer: Self-pay | Admitting: Nephrology

## 2022-02-28 ENCOUNTER — Other Ambulatory Visit: Payer: Self-pay | Admitting: Family

## 2022-02-28 DIAGNOSIS — E1165 Type 2 diabetes mellitus with hyperglycemia: Secondary | ICD-10-CM

## 2022-02-28 DIAGNOSIS — N1831 Chronic kidney disease, stage 3a: Secondary | ICD-10-CM

## 2022-03-02 ENCOUNTER — Other Ambulatory Visit: Payer: Self-pay | Admitting: Family

## 2022-03-04 NOTE — Progress Notes (Signed)
? ? ?Patient ID: Alan Reeves, male    DOB: May 14, 1956  MRN: [id number] ? ?CC: Diabetes Follow-Up ? ?Subjective: ?Alan Reeves is a 66 y.o. male who presents for diabetes follow-up.  ? ?His concerns today include:  ?DIABETES TYPE 2 FOLLOW-UP: ?11/23/2021: ?- Hemoglobin A1c today 8.3%. This is improved from previous 9.1% on 10/26/2021. ?- Continue Metformin, Glipizide, and Pioglitazone as prescribed. No refills needed as of present.  ?- Continue Dulaglutide as prescribed.  ? ?03/06/2022: ?Doing well on current regimen, no issues/concerns. Has been at least 7 days without Trulicity due to needing refills.  ? ?2. HYPERTENSION FOLLOW-UP: ?Doing well on current regimen. No side effects. No issues/concerns. Denies chest pain and shortness of breath.  ? ?3. HYPERLIPIDEMIA FOLLOW-UP: ?Need refills on Rosuvastatin. No issues/concerns.  ? ?4. CHRONIC KIDNEY FOLLOW-UP: ?Followed by Zetta Bills, MD. Reports thinks kidney issues related to lithium. Has recommended Psychiatry change regimen if possible. Patient has appointment with Psychiatry soon.  ? ?5. ITCH: ?Primarily shins and elbows. No additional symptoms.  ? ?Patient Active Problem List  ? Diagnosis Date Noted  ? CKD (chronic kidney disease) 11/24/2021  ? Mixed dyslipidemia 02/22/2021  ? 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 Diabetes mellitus without complication (HCC) 10/08/2017  ? Keratoconjunctivitis sicca of both eyes not specified as Sjogren's 10/08/2017  ? Long term current use of oral hypoglycemic drug 10/08/2017  ? Nuclear sclerotic cataract of both eyes 10/08/2017  ? Presbyopia of both eyes 10/08/2017  ? Vitreous floaters, bilateral 10/08/2017  ? Benign paroxysmal positional vertigo 11/15/2016  ? Impacted cerumen of left ear 11/15/2016  ? Needs flu shot 11/15/2016  ? Hyperlipemia 07/07/2016  ? Overweight (BMI 25.0-29.9) 07/07/2016  ? Essential hypertension 04/05/2016  ? Type 2 diabetes mellitus with hyperglycemia (HCC)  04/05/2016  ? Frozen shoulder 09/24/2014  ? Shoulder impingement syndrome 09/24/2014  ?  ? ?Current Outpatient Medications on File Prior to Visit  ?Medication Sig Dispense Refill  ? glipiZIDE (GLUCOTROL) 10 MG tablet Take 1 tablet by mouth twice daily 180 tablet 0  ? glucose blood (TRUE METRIX BLOOD GLUCOSE TEST) test strip Use as instructed 100 each 12  ? lithium carbonate 300 MG capsule Take 300 mg by mouth 2 (two) times daily.    ? venlafaxine (EFFEXOR) 75 MG tablet Take 75 mg by mouth 2 (two) times daily with a meal.    ? ?No current facility-administered medications on file prior to visit.  ? ? ?No Known Allergies ? ?Social History  ? ?Socioeconomic History  ? Marital status: Divorced  ?  Spouse name: Not on file  ? Number of children: Not on file  ? Years of education: Not on file  ? Highest education level: Not on file  ?Occupational History  ? Not on file  ?Tobacco Use  ? Smoking status: Never  ?  Passive exposure: Never  ? Smokeless tobacco: Never  ?Vaping Use  ? Vaping Use: Never used  ?Substance and Sexual Activity  ? Alcohol use: No  ? Drug use: No  ? Sexual activity: Not on file  ?Other Topics Concern  ? Not on file  ?Social History Narrative  ? Not on file  ? ?Social Determinants of Health  ? ?Financial Resource Strain: Not on file  ?Food Insecurity: Not on file  ?Transportation Needs: Not on file  ?Physical Activity: Not on file  ?Stress: Not on file  ?Social Connections: Not on file  ?Intimate Partner Violence: Not on file  ? ? ?  Family History  ?Problem Relation Age of Onset  ? Hypertension Mother   ? Diabetes Father   ? Cancer Father   ? ? ?No past surgical history on file. ? ?ROS: ?Review of Systems ?Negative except as stated above ? ?PHYSICAL EXAM: ?BP 116/75 (BP Location: Left Arm, Patient Position: Sitting, Cuff Size: Normal)   Pulse 83   Temp 98.3 ?F (36.8 ?C)   Resp 18   Ht 5' 11.42" (1.814 m)   Wt 211 lb (95.7 kg)   SpO2 95%   BMI 29.09 kg/m?  ? ?Physical Exam ?HENT:  ?   Head:  Normocephalic and atraumatic.  ?Eyes:  ?   Extraocular Movements: Extraocular movements intact.  ?   Conjunctiva/sclera: Conjunctivae normal.  ?   Pupils: Pupils are equal, round, and reactive to light.  ?Cardiovascular:  ?   Rate and Rhythm: Normal rate and regular rhythm.  ?   Pulses: Normal pulses.  ?   Heart sounds: Normal heart sounds.  ?Pulmonary:  ?   Effort: Pulmonary effort is normal.  ?   Breath sounds: Normal breath sounds.  ?Musculoskeletal:  ?   Cervical back: Normal range of motion and neck supple.  ?Neurological:  ?   General: No focal deficit present.  ?   Mental Status: He is alert and oriented to person, place, and time.  ?Psychiatric:     ?   Mood and Affect: Mood normal.     ?   Behavior: Behavior normal.  ? ?Results for orders placed or performed in visit on 03/06/22  ?POCT glycosylated hemoglobin (Hb A1C)  ?Result Value Ref Range  ? Hemoglobin A1C 7.6 (A) 4.0 - 5.6 %  ? HbA1c POC (<> result, manual entry)    ? HbA1c, POC (prediabetic range)    ? HbA1c, POC (controlled diabetic range)    ? ? ?ASSESSMENT AND PLAN: ?1. Type 2 diabetes mellitus with hyperglycemia, without long-term current use of insulin (HCC): ?- Hemoglobin A1c today relative to goal at 7.6%, goal < 7%. This is improved from previous 8.3% on 11/23/2021. ?- Continue Metformin and Pioglitazone as prescribed.  ?- Continue Glipizide as prescribed. No refills needed as of present.  ?- Increase Dulaglutide from 0.75 mg weekly to 1.5 mg weekly.  ?- Discussed the importance of healthy eating habits, low-carbohydrate diet, low-sugar diet, regular aerobic exercise (at least 150 minutes a week as tolerated) and medication compliance to achieve or maintain control of diabetes. ?- Follow-up with primary provider in 4 weeks or sooner if needed. ?- POCT glycosylated hemoglobin (Hb A1C) ?- Insulin Pen Needle (PEN NEEDLES) 31G X 8 MM MISC; UAD  Dispense: 100 each; Refill: 0 ?- pioglitazone (ACTOS) 45 MG tablet; Take 1 tablet (45 mg total) by mouth  daily.  Dispense: 90 tablet; Refill: 0 ?- metFORMIN (GLUCOPHAGE) 1000 MG tablet; Take 1 tablet (1,000 mg total) by mouth 2 (two) times daily.  Dispense: 180 tablet; Refill: 0 ?- Dulaglutide 1.5 MG/0.5ML SOPN; Inject 1.5 mg into the skin once a week.  Dispense: 6 mL; Refill: 0 ? ?2. Essential (primary) hypertension: ?- Continue Lisinopril and Amlodipine as prescribed.  ?- Counseled on blood pressure goal of less than 130/80, low-sodium, DASH diet, medication compliance, and 150 minutes of moderate intensity exercise per week as tolerated. Counseled on medication adherence and adverse effects. ?- Follow-up with primary provider in 3 months or sooner if needed.  ?- lisinopril (ZESTRIL) 5 MG tablet; Take 1 tablet (5 mg total) by mouth daily.  Dispense: 90  tablet; Refill: 0 ?- amLODipine (NORVASC) 5 MG tablet; Take 1 tablet (5 mg total) by mouth daily.  Dispense: 90 tablet; Refill: 0 ? ?3. Mixed dyslipidemia: ?- Continue Rosuvastatin as prescribed.  ?- Follow-up with primary provider as scheduled.  ?- rosuvastatin (CRESTOR) 20 MG tablet; Take 1 tablet (20 mg total) by mouth daily.  Dispense: 120 tablet; Refill: 0 ? ?4. Stage 3a chronic kidney disease (HCC): ?- Keep all scheduled appointments with Zetta BillsJay Patel, MD at Nephrology.  ? ?5. Itch of skin: ?- Triamcinolone ointment as prescribed. Counseled on medication adherence and adverse effects. Also, may try course of over-the-counter allergy medication.  ?- Follow-up with primary provider as scheduled. ?- triamcinolone ointment (KENALOG) 0.5 %; Apply 1 application. topically 2 (two) times daily.  Dispense: 60 g; Refill: 1 ? ?Patient was given the opportunity to ask questions.  Patient verbalized understanding of the plan and was able to repeat key elements of the plan. Patient was given clear instructions to go to Emergency Department or return to medical center if symptoms don't improve, worsen, or new problems develop.The patient verbalized understanding. ? ? ?Orders  Placed This Encounter  ?Procedures  ? POCT glycosylated hemoglobin (Hb A1C)  ? ? ?Requested Prescriptions  ? ?Signed Prescriptions Disp Refills  ? Dulaglutide 1.5 MG/0.5ML SOPN 6 mL 0  ?  Sig: Inject 1.5 mg into the s

## 2022-03-06 ENCOUNTER — Other Ambulatory Visit: Payer: Self-pay | Admitting: Family

## 2022-03-06 ENCOUNTER — Ambulatory Visit (INDEPENDENT_AMBULATORY_CARE_PROVIDER_SITE_OTHER): Payer: Self-pay | Admitting: Family

## 2022-03-06 ENCOUNTER — Encounter: Payer: Self-pay | Admitting: Family

## 2022-03-06 ENCOUNTER — Other Ambulatory Visit: Payer: Self-pay

## 2022-03-06 VITALS — BP 116/75 | HR 83 | Temp 98.3°F | Resp 18 | Ht 71.42 in | Wt 211.0 lb

## 2022-03-06 DIAGNOSIS — L299 Pruritus, unspecified: Secondary | ICD-10-CM

## 2022-03-06 DIAGNOSIS — I10 Essential (primary) hypertension: Secondary | ICD-10-CM

## 2022-03-06 DIAGNOSIS — N1831 Chronic kidney disease, stage 3a: Secondary | ICD-10-CM

## 2022-03-06 DIAGNOSIS — E782 Mixed hyperlipidemia: Secondary | ICD-10-CM

## 2022-03-06 DIAGNOSIS — E1165 Type 2 diabetes mellitus with hyperglycemia: Secondary | ICD-10-CM

## 2022-03-06 LAB — POCT GLYCOSYLATED HEMOGLOBIN (HGB A1C): Hemoglobin A1C: 7.6 % — AB (ref 4.0–5.6)

## 2022-03-06 MED ORDER — DULAGLUTIDE 1.5 MG/0.5ML ~~LOC~~ SOAJ: 1.5000 mg | SUBCUTANEOUS | 0 refills | Status: AC

## 2022-03-06 MED ORDER — TRIAMCINOLONE ACETONIDE 0.5 % EX OINT: 1.0000 "application " | TOPICAL_OINTMENT | Freq: Two times a day (BID) | CUTANEOUS | 1 refills | Status: AC

## 2022-03-06 MED ORDER — ROSUVASTATIN CALCIUM 20 MG PO TABS: 20.0000 mg | ORAL_TABLET | Freq: Every day | ORAL | 0 refills | Status: DC

## 2022-03-06 MED ORDER — PEN NEEDLES 31G X 8 MM MISC: 0 refills | Status: DC

## 2022-03-06 MED ORDER — METFORMIN HCL 1000 MG PO TABS: 1000.0000 mg | ORAL_TABLET | Freq: Two times a day (BID) | ORAL | 0 refills | Status: DC

## 2022-03-06 MED ORDER — AMLODIPINE BESYLATE 5 MG PO TABS: 5.0000 mg | ORAL_TABLET | Freq: Every day | ORAL | 0 refills | Status: DC

## 2022-03-06 MED ORDER — LISINOPRIL 5 MG PO TABS: 5.0000 mg | ORAL_TABLET | Freq: Every day | ORAL | 0 refills | Status: DC

## 2022-03-06 MED ORDER — PIOGLITAZONE HCL 45 MG PO TABS: 45.0000 mg | ORAL_TABLET | Freq: Every day | ORAL | 0 refills | Status: DC

## 2022-03-06 NOTE — Progress Notes (Signed)
Pt presents for diabetes follow-up ?A1c 8.3% -11/23/21 down to 7.6% today ? ? ?

## 2022-03-06 NOTE — Progress Notes (Signed)
Diabetes discussed in office.

## 2022-03-07 NOTE — Telephone Encounter (Signed)
Provider has already approved rx yesterday at 1516 #90/0 ?Requested Prescriptions  ?Pending Prescriptions Disp Refills  ?? lisinopril (ZESTRIL) 5 MG tablet [Pharmacy Med Name: Lisinopril 5 MG Oral Tablet] 90 tablet 0  ?  Sig: Take 1 tablet by mouth once daily  ?  ? Cardiovascular:  ACE Inhibitors Failed - 03/06/2022  9:21 AM  ?  ?  Failed - Cr in normal range and within 180 days  ?  Creatinine  ?Date Value Ref Range Status  ?02/27/2022 1.6 (A) 0.6 - 1.3 Final  ? ?Creat  ?Date Value Ref Range Status  ?03/31/2014 1.12 0.50 - 1.35 mg/dL Final  ? ?Creatinine, Ser  ?Date Value Ref Range Status  ?11/23/2021 1.46 (H) 0.76 - 1.27 mg/dL Final  ?   ?  ?  Passed - K in normal range and within 180 days  ?  Potassium  ?Date Value Ref Range Status  ?02/27/2022 4.6 3.5 - 5.1 mEq/L Final  ?   ?  ?  Passed - Patient is not pregnant  ?  ?  Passed - Last BP in normal range  ?  BP Readings from Last 1 Encounters:  ?03/06/22 116/75  ?   ?  ?  Passed - Valid encounter within last 6 months  ?  Recent Outpatient Visits   ?      ? Yesterday Type 2 diabetes mellitus with hyperglycemia, without long-term current use of insulin (HCC)  ? Primary Care at [facility], Amy J, NP  ? 3 months ago Type 2 diabetes mellitus with hyperglycemia, without long-term current use of insulin (HCC)  ? Primary Care at [facility], Amy J, NP  ? 4 months ago Type 2 diabetes mellitus with hyperglycemia, without long-term current use of insulin (HCC)  ? Primary Care at [facility], Amy J, NP  ? 7 months ago Type 2 diabetes mellitus with hyperglycemia, without long-term current use of insulin (HCC)  ? Primary Care at [facility], Amy J, NP  ? 8 months ago Essential hypertension  ? Primary Care at [facility], [place], NP  ?  ?  ?Future Appointments   ?        ? In 4 weeks Rema Fendt, NP Primary Care at [facility]  ?  ? ?  ?  ?  ? ? ?

## 2022-03-30 NOTE — Progress Notes (Signed)
? ? ?Patient ID: Alan Reeves, male    DOB: 02-09-1956  MRN: [id number] ? ?CC: Diabetes Follow-Up ? ?Subjective: ?Alan Reeves is a 66 y.o. male who presents for diabetes follow-up. ? ?His concerns today include:  ?Diabetes type 2 follow-up: ?03/06/2022: ?- Hemoglobin A1c today relative to goal at 7.6%, goal < 7%. This is improved from previous 8.3% on 11/23/2021. ?- Continue Metformin and Pioglitazone as prescribed.  ?- Continue Glipizide as prescribed. No refills needed as of present.  ?- Increase Dulaglutide from 0.75 mg weekly to 1.5 mg weekly.  ? ?04/04/2022: ?Reports unable to get Dulaglutide since last appointment related to health insurance concerns. Reports deductible $7500. Was offered a Dulaglutide coupon for 75% which would make cost $200 instead of $800 but unable to afford currently. Otherwise doing well on remaining regimen. Having some nausea and reports related to being weaned off of Lithium and started on a new medication per Psychiatry. ? ?Patient Active Problem List  ? Diagnosis Date Noted  ? CKD (chronic kidney disease) 11/24/2021  ? Mixed dyslipidemia 02/22/2021  ? 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 Diabetes mellitus without complication (HCC) 10/08/2017  ? Keratoconjunctivitis sicca of both eyes not specified as Sjogren's 10/08/2017  ? Long term current use of oral hypoglycemic drug 10/08/2017  ? Nuclear sclerotic cataract of both eyes 10/08/2017  ? Presbyopia of both eyes 10/08/2017  ? Vitreous floaters, bilateral 10/08/2017  ? Benign paroxysmal positional vertigo 11/15/2016  ? Impacted cerumen of left ear 11/15/2016  ? Needs flu shot 11/15/2016  ? Hyperlipemia 07/07/2016  ? Overweight (BMI 25.0-29.9) 07/07/2016  ? Essential hypertension 04/05/2016  ? Type 2 diabetes mellitus with hyperglycemia (HCC) 04/05/2016  ? Frozen shoulder 09/24/2014  ? Shoulder impingement syndrome 09/24/2014  ?  ? ?Current Outpatient Medications on File Prior to Visit   ?Medication Sig Dispense Refill  ? amLODipine (NORVASC) 5 MG tablet Take 1 tablet (5 mg total) by mouth daily. 90 tablet 0  ? Dulaglutide 1.5 MG/0.5ML SOPN Inject 1.5 mg into the skin once a week. 6 mL 0  ? glipiZIDE (GLUCOTROL) 10 MG tablet Take 1 tablet by mouth twice daily 180 tablet 0  ? glucose blood (TRUE METRIX BLOOD GLUCOSE TEST) test strip Use as instructed 100 each 12  ? Insulin Pen Needle (PEN NEEDLES) 31G X 8 MM MISC UAD 100 each 0  ? lisinopril (ZESTRIL) 5 MG tablet Take 1 tablet (5 mg total) by mouth daily. 90 tablet 0  ? lithium carbonate 300 MG capsule Take 300 mg by mouth 2 (two) times daily.    ? metFORMIN (GLUCOPHAGE) 1000 MG tablet Take 1 tablet (1,000 mg total) by mouth 2 (two) times daily. 180 tablet 0  ? pioglitazone (ACTOS) 45 MG tablet Take 1 tablet (45 mg total) by mouth daily. 90 tablet 0  ? rosuvastatin (CRESTOR) 20 MG tablet Take 1 tablet (20 mg total) by mouth daily. 120 tablet 0  ? triamcinolone ointment (KENALOG) 0.5 % Apply 1 application. topically 2 (two) times daily. 60 g 1  ? venlafaxine (EFFEXOR) 75 MG tablet Take 75 mg by mouth 2 (two) times daily with a meal.    ? ?No current facility-administered medications on file prior to visit.  ? ? ?No Known Allergies ? ?Social History  ? ?Socioeconomic History  ? Marital status: Divorced  ?  Spouse name: Not on file  ? Number of children: Not on file  ? Years of education: Not on file  ?  Highest education level: Not on file  ?Occupational History  ? Not on file  ?Tobacco Use  ? Smoking status: Never  ?  Passive exposure: Never  ? Smokeless tobacco: Never  ?Vaping Use  ? Vaping Use: Never used  ?Substance and Sexual Activity  ? Alcohol use: No  ? Drug use: No  ? Sexual activity: Not on file  ?Other Topics Concern  ? Not on file  ?Social History Narrative  ? Not on file  ? ?Social Determinants of Health  ? ?Financial Resource Strain: Not on file  ?Food Insecurity: Not on file  ?Transportation Needs: Not on file  ?Physical Activity: Not on  file  ?Stress: Not on file  ?Social Connections: Not on file  ?Intimate Partner Violence: Not on file  ? ? ?Family History  ?Problem Relation Age of Onset  ? Hypertension Mother   ? Diabetes Father   ? Cancer Father   ? ? ?No past surgical history on file. ? ?ROS: ?Review of Systems ?Negative except as stated above ? ?PHYSICAL EXAM: ?BP 112/72 (BP Location: Left Arm, Patient Position: Sitting, Cuff Size: Normal)   Pulse 74   Temp 98.3 ?F (36.8 ?C)   Resp 18   Ht 5' 11.42" (1.814 m)   Wt 211 lb (95.7 kg)   SpO2 99%   BMI 29.09 kg/m?  ? ?Physical Exam ?HENT:  ?   Head: Normocephalic and atraumatic.  ?Eyes:  ?   Extraocular Movements: Extraocular movements intact.  ?   Conjunctiva/sclera: Conjunctivae normal.  ?   Pupils: Pupils are equal, round, and reactive to light.  ?Cardiovascular:  ?   Rate and Rhythm: Normal rate and regular rhythm.  ?   Pulses: Normal pulses.  ?   Heart sounds: Normal heart sounds.  ?Pulmonary:  ?   Effort: Pulmonary effort is normal.  ?   Breath sounds: Normal breath sounds.  ?Musculoskeletal:  ?   Cervical back: Normal range of motion and neck supple.  ?Neurological:  ?   General: No focal deficit present.  ?   Mental Status: He is alert and oriented to person, place, and time.  ?Psychiatric:     ?   Mood and Affect: Mood normal.     ?   Behavior: Behavior normal.  ? ? ?ASSESSMENT AND PLAN: ?1. Type 2 diabetes mellitus with hyperglycemia, without long-term current use of insulin (HCC): ?- Continue Metformin, Glipizide, and Pioglitazone as prescribed. No refills needed as of present.  ?- Patient reports unable to afford Dulaglutide as of present related to health insurance.  ?- Follow-up with primary provider in 8 weeks or sooner if needed. Will repeat hemoglobin A1c at that time.  ? ? ?Patient was given the opportunity to ask questions.  Patient verbalized understanding of the plan and was able to repeat key elements of the plan. Patient was given clear instructions to go to Emergency  Department or return to medical center if symptoms don't improve, worsen, or new problems develop.The patient verbalized understanding. ? ? ?Return in about 8 weeks (around 05/30/2022) for Follow-Up or next available DM. ? ?Rema Fendt, NP  ?

## 2022-04-04 ENCOUNTER — Ambulatory Visit (INDEPENDENT_AMBULATORY_CARE_PROVIDER_SITE_OTHER): Payer: Self-pay | Admitting: Family

## 2022-04-04 VITALS — BP 112/72 | HR 74 | Temp 98.3°F | Resp 18 | Ht 71.42 in | Wt 211.0 lb

## 2022-04-04 DIAGNOSIS — E1165 Type 2 diabetes mellitus with hyperglycemia: Secondary | ICD-10-CM

## 2022-04-04 NOTE — Progress Notes (Signed)
Pt presents for diabetes follow-up  

## 2022-04-04 NOTE — Patient Instructions (Signed)
Dulaglutide Injection ?What is this medication? ?DULAGLUTIDE (DOO la GLOO tide) treats type 2 diabetes. It works by increasing insulin levels in your body, which decreases your blood sugar (glucose). It also reduces the amount of sugar released into your blood and slows down your digestion. It can also be used to lower the risk of heart attack and stroke in people with type 2 diabetes. Changes to diet and exercise are often combined with this medication. ?This medicine may be used for other purposes; ask your health care provider or pharmacist if you have questions. ?COMMON BRAND NAME(S): Trulicity ?What should I tell my care team before I take this medication? ?They need to know if you have any of these conditions: ?Endocrine tumors (MEN 2) or if someone in your family had these tumors ?Eye disease, vision problems ?History of pancreatitis ?Kidney disease ?Liver disease ?Stomach or intestine problems ?Thyroid cancer or if someone in your family had thyroid cancer ?An unusual or allergic reaction to dulaglutide, other medications, foods, dyes, or preservatives ?Pregnant or trying to get pregnant ?Breast-feeding ?How should I use this medication? ?This medication is injected under the skin. You will be taught how to prepare and give it. Take it as directed on the prescription label on the same day of each week. Do NOT prime the pen. Keep taking it unless your care team tells you to stop. ?If you use this medication with insulin, you should inject this medication and the insulin separately. Do not mix them together. Do not give the injections right next to each other. Change (rotate) injection sites with each injection. ?This medication comes with INSTRUCTIONS FOR USE. Ask your pharmacist for directions on how to use this medication. Read the information carefully. Talk to your pharmacist or care team if you have questions. ?It is important that you put your used needles and syringes in a special sharps container. Do  not put them in a trash can. If you do not have a sharps container, call your pharmacist or care team to get one. ?A special MedGuide will be given to you by the pharmacist with each prescription and refill. Be sure to read this information carefully each time. ?Talk to your care team about the use of this medication in children. While it may be prescribed for children as young as 10 years for selected conditions, precautions do apply. ?Overdosage: If you think you have taken too much of this medicine contact a poison control center or emergency room at once. ?NOTE: This medicine is only for you. Do not share this medicine with others. ?What if I miss a dose? ?If you miss a dose, take it as soon as you can unless it is more than 3 days late. If it is more than 3 days late, skip the missed dose. Take the next dose at the normal time. ?What may interact with this medication? ?Other medications for diabetes ?Many medications may cause changes in blood sugar, these include: ?Alcohol containing beverages ?Antiviral medications for HIV or AIDS ?Aspirin and aspirin-like medications ?Certain medications for blood pressure, heart disease, irregular heart beat ?Chromium ?Diuretics ?Male hormones, such as estrogens or progestins, birth control pills ?Fenofibrate ?Gemfibrozil ?Isoniazid ?Lanreotide ?Male hormones or anabolic steroids ?MAOIs like Carbex, Eldepryl, Marplan, Nardil, and Parnate ?Medications for allergies, asthma, cold, or cough ?Medications for depression, anxiety, or psychotic disturbances ?Medications for weight loss ?Niacin ?Nicotine ?NSAIDs, medications for pain and inflammation, like ibuprofen or naproxen ?Octreotide ?Pasireotide ?Pentamidine ?Phenytoin ?Probenecid ?Quinolone antibiotics such as ciprofloxacin, levofloxacin,  ofloxacin ?Some herbal dietary supplements ?Steroid medications such as prednisone or cortisone ?Sulfamethoxazole; trimethoprim ?Thyroid hormones ?Some medications can hide the warning  symptoms of low blood sugar (hypoglycemia). You may need to monitor your blood sugar more closely if you are taking one of these medications. These include: ?Beta-blockers, often used for high blood pressure or heart problems (examples include atenolol, metoprolol, propranolol) ?Clonidine ?Guanethidine ?Reserpine ?This list may not describe all possible interactions. Give your health care provider a list of all the medicines, herbs, non-prescription drugs, or dietary supplements you use. Also tell them if you smoke, drink alcohol, or use illegal drugs. Some items may interact with your medicine. ?What should I watch for while using this medication? ?Visit your care team for regular checks on your progress. ?Check with your care team if you have severe diarrhea, nausea, and vomiting, or if you sweat a lot. The loss of too much body fluid may make it dangerous for you to take this medication. ?A test called the HbA1C (A1C) will be monitored. This is a simple blood test. It measures your blood sugar control over the last 2 to 3 months. You will receive this test every 3 to 6 months. ?Learn how to check your blood sugar. Learn the symptoms of low and high blood sugar and how to manage them. ?Always carry a quick-source of sugar with you in case you have symptoms of low blood sugar. Examples include hard sugar candy or glucose tablets. Make sure others know that you can choke if you eat or drink when you develop serious symptoms of low blood sugar, such as seizures or unconsciousness. Get medical help at once. ?Tell your care team if you have high blood sugar. You might need to change the dose of your medication. If you are sick or exercising more than usual, you may need to change the dose of your medication. ?Do not skip meals. Ask your care team if you should avoid alcohol. Many nonprescription cough and cold products contain sugar or alcohol. These can affect blood sugar. ?Pens should never be shared. Even if the  needle is changed, sharing may result in passing of viruses like hepatitis or HIV. ?Wear a medical ID bracelet or chain. Carry a card that describes your condition. List the medications and doses you take on the card. ?What side effects may I notice from receiving this medication? ?Side effects that you should report to your care team as soon as possible: ?Allergic reactions--skin rash, itching, hives, swelling of the face, lips, tongue, or throat ?Change in vision ?Dehydration--increased thirst, dry mouth, feeling faint or lightheaded, headache, dark yellow or brown urine ?Kidney injury--decrease in the amount of urine, swelling of the ankles, hands, or feet ?Pancreatitis--severe stomach pain that spreads to your back or gets worse after eating or when touched, fever, nausea, vomiting ?Thyroid cancer--new mass or lump in the neck, pain or trouble swallowing, trouble breathing, hoarseness ?Side effects that usually do not require medical attention (report to your care team if they continue or are bothersome): ?Diarrhea ?Loss of appetite ?Nausea ?Stomach pain ?Vomiting ?This list may not describe all possible side effects. Call your doctor for medical advice about side effects. You may report side effects to FDA at 1-800-FDA-1088. ?Where should I keep my medication? ?Keep out of the reach of children and pets. ?Refrigeration (preferred): Store unopened pens in a refrigerator between 2 and 8 degrees C (36 and 46 degrees F). Keep it in the original carton until you are ready to  take it. Do not freeze or use if the medication has been frozen. Protect from light. Get rid of any unused medication after the expiration date on the label. ?Room Temperature: The pen may be stored at room temperature below 30 degrees C (86 degrees F) for up to a total of 14 days if needed. Protect from light. Avoid exposure to extreme heat. If it is stored at room temperature, throw away any unused medication after 14 days or after it expires,  whichever is first. ?To get rid of medications that are no longer needed or have expired: ?Take the medication to a medication take-back program. Check with your pharmacy or law enforcement to find a loc

## 2022-05-12 ENCOUNTER — Other Ambulatory Visit: Payer: Self-pay | Admitting: Family

## 2022-05-12 DIAGNOSIS — I10 Essential (primary) hypertension: Secondary | ICD-10-CM

## 2022-05-17 ENCOUNTER — Other Ambulatory Visit: Payer: Self-pay | Admitting: Family

## 2022-05-17 DIAGNOSIS — E1165 Type 2 diabetes mellitus with hyperglycemia: Secondary | ICD-10-CM

## 2022-05-17 DIAGNOSIS — I10 Essential (primary) hypertension: Secondary | ICD-10-CM

## 2022-05-17 MED ORDER — METFORMIN HCL 1000 MG PO TABS: 1000.0000 mg | ORAL_TABLET | Freq: Two times a day (BID) | ORAL | 0 refills | Status: DC

## 2022-05-17 NOTE — Telephone Encounter (Signed)
Requested Prescriptions  Pending Prescriptions Disp Refills  . lisinopril (ZESTRIL) 5 MG tablet 90 tablet 0    Sig: Take 1 tablet (5 mg total) by mouth daily.     Cardiovascular:  ACE Inhibitors Failed - 05/17/2022 11:33 AM      Failed - Cr in normal range and within 180 days    Creatinine  Date Value Ref Range Status  02/27/2022 1.6 (A) 0.6 - 1.3 Final   Creat  Date Value Ref Range Status  03/31/2014 1.12 0.50 - 1.35 mg/dL Final   Creatinine, Ser  Date Value Ref Range Status  11/23/2021 1.46 (H) 0.76 - 1.27 mg/dL Final         Passed - K in normal range and within 180 days    Potassium  Date Value Ref Range Status  02/27/2022 4.6 3.5 - 5.1 mEq/L Final         Passed - Patient is not pregnant      Passed - Last BP in normal range    BP Readings from Last 1 Encounters:  04/04/22 112/72         Passed - Valid encounter within last 6 months    Recent Outpatient Visits          1 month ago Type 2 diabetes mellitus with hyperglycemia, without long-term current use of insulin [facility])   Primary [facility] at [facility], Amy J, NP   2 months ago Type 2 diabetes mellitus with hyperglycemia, without long-term current use of insulin [facility])   Primary Care at [facility], Amy J, NP   5 months ago Type 2 diabetes mellitus with hyperglycemia, without long-term current use of insulin [facility])   Primary Care at [facility], Amy J, NP   6 months ago Type 2 diabetes mellitus with hyperglycemia, without long-term current use of insulin [facility])   Primary Care at [facility], Amy J, NP   9 months ago Type 2 diabetes mellitus with hyperglycemia, without long-term current use of insulin [facility])   Primary Care at [facility], Flonnie Hailstone, NP      Future Appointments            In 1 week Camillia Herter, NP Primary Care at [facility]           . metFORMIN (GLUCOPHAGE) 1000 MG tablet 180 tablet 0    Sig: Take 1 tablet (1,000 mg total) by mouth 2  (two) times daily.     Endocrinology:  Diabetes - Biguanides Failed - 05/17/2022 11:33 AM      Failed - Cr in normal range and within 360 days    Creatinine  Date Value Ref Range Status  02/27/2022 1.6 (A) 0.6 - 1.3 Final   Creat  Date Value Ref Range Status  03/31/2014 1.12 0.50 - 1.35 mg/dL Final   Creatinine, Ser  Date Value Ref Range Status  11/23/2021 1.46 (H) 0.76 - 1.27 mg/dL Final         Failed - B12 Level in normal range and within 720 days    No results found for: VITAMINB12       Failed - CBC within normal limits and completed in the last 12 months    WBC  Date Value Ref Range Status  02/27/2022 10.3  Final  03/31/2014 8.4 4.0 - 10.5 K/uL Final   RBC  Date Value Ref Range Status  02/27/2022 4.18 3.87 - 5.11 Final   Hemoglobin  Date Value  Ref Range Status  02/27/2022 12.3 (A) 13.5 - 17.5 Final  02/18/2021 14.0 13.0 - 17.7 g/dL Final   HCT  Date Value Ref Range Status  02/27/2022 38 (A) 41 - 53 Final   Hematocrit  Date Value Ref Range Status  02/18/2021 42.9 37.5 - 51.0 % Final   MCHC  Date Value Ref Range Status  02/18/2021 32.6 31.5 - 35.7 g/dL Final  03/31/2014 34.6 30.0 - 36.0 g/dL Final   [facility]  Date Value Ref Range Status  02/18/2021 28.7 26.6 - 33.0 pg Final  03/31/2014 29.5 26.0 - 34.0 pg Final   MCV  Date Value Ref Range Status  02/18/2021 88 79 - 97 fL Final   No results found for: PLTCOUNTKUC, LABPLAT, POCPLA RDW  Date Value Ref Range Status  02/18/2021 13.0 11.6 - 15.4 % Final         Passed - HBA1C is between 0 and 7.9 and within 180 days    Hemoglobin A1C  Date Value Ref Range Status  03/06/2022 7.6 (A) 4.0 - 5.6 % Final   Hgb A1c MFr Bld  Date Value Ref Range Status  02/18/2021 8.4 (H) 4.8 - 5.6 % Final    Comment:             Prediabetes: 5.7 - 6.4          Diabetes: >6.4          Glycemic control for adults with diabetes: <7.0          Passed - eGFR in normal range and within 360 days    eGFR  Date Value Ref  Range Status  02/27/2022 49  Final  11/23/2021 54 (L) >59 mL/min/1.73 Final         Passed - Valid encounter within last 6 months    Recent Outpatient Visits          1 month ago Type 2 diabetes mellitus with hyperglycemia, without long-term current use of insulin [facility])   Primary [facility] at [facility], Amy J, NP   2 months ago Type 2 diabetes mellitus with hyperglycemia, without long-term current use of insulin [facility])   Primary Care at [facility], Amy J, NP   5 months ago Type 2 diabetes mellitus with hyperglycemia, without long-term current use of insulin [facility])   [facility] at [facility], Amy J, NP   6 months ago Type 2 diabetes mellitus with hyperglycemia, without long-term current use of insulin [facility])   Primary Care at [facility], Amy J, NP   9 months ago Type 2 diabetes mellitus with hyperglycemia, without long-term current use of insulin [facility])   Primary Care at [facility], Flonnie Hailstone, NP      Future Appointments            In 1 week Camillia Herter, NP Primary Care at [facility]

## 2022-05-17 NOTE — Telephone Encounter (Signed)
Medication Refill - Medication:  lisinopril (ZESTRIL) 5 MG tablet and metFORMIN (GLUCOPHAGE) 1000 MG tablet  Has the patient contacted their pharmacy? Yes.   Pt told to contact provider  Preferred Pharmacy (with phone number or street name):  [facility] [address] [place]), [place] - [id number] Phone:  [phone]  Fax:  [phone]     Has the patient been seen for an appointment in the last year OR does the patient have an upcoming appointment? Yes.    Agent: Please be advised that RX refills may take up to 3 business days. We ask that you follow-up with your pharmacy.

## 2022-05-18 ENCOUNTER — Encounter: Payer: Self-pay | Admitting: Family

## 2022-05-20 ENCOUNTER — Other Ambulatory Visit: Payer: Self-pay | Admitting: Family

## 2022-05-20 DIAGNOSIS — E1165 Type 2 diabetes mellitus with hyperglycemia: Secondary | ICD-10-CM

## 2022-05-21 NOTE — Telephone Encounter (Signed)
Glipizide refilled 02/28/2022 for 90 day supply. Patient should have enough medication until next appointment 05/30/2022. Call patient to confirm necessity of early refill request.

## 2022-05-22 NOTE — Progress Notes (Signed)
Patient ID: Alan Reeves, male    DOB: October 27, 1956  MRN: [id number]  CC: Chronic Care Management  Subjective: Alan Reeves is a 66 y.o. male who presents for chronic care management.  His concerns today include:  Diabetes type 2 follow-up: 04/04/2022: - Continue Metformin, Glipizide, and Pioglitazone as prescribed. No refills needed as of present.  - Patient reports unable to afford Dulaglutide as of present related to health insurance.  - Follow-up with primary provider in 8 weeks or sooner if needed. Will repeat hemoglobin A1c at that time.   05/30/2022: Reports still unable to get Dulaglutide refilled due to financial concerns. Doing well on remaining regimen. He is not checking blood sugars at home reporting he feels fine. Reports he was followed by Endocrinology in the past before previous PCP began managing his diabetes.   2. Hypertension follow-up: 03/06/2022: - Continue Lisinopril and Amlodipine as prescribed.   05/30/2022: Doing well on current regimen. No side effects. No issues/concerns. Denies chest pain, shortness of breath, worst headache of life and additional red flag symptoms.   3. Hyperlipidemia follow-up: 03/06/2022: - Continue Rosuvastatin as prescribed.   05/30/2022: Doing well on current regimen, no issues/concerns.   4. Anxiety depression: 5. Bipolar:  Reports he is established with a psychiatrist at [facility] but intends to discontinue care at their office related to financial concerns. Reports has to pay around $180 per visit. States "All the doctor does is ask the same questions every time I see him and refills my medications." "You do the same thing he does and ask me the same questions he does so I can get my refills here but wanted to ask you first." Reports he does not receive counseling services at psychiatry stating he doesn't need counseling currently.   Patient went on to state that he is currently only taking Venlafaxine. Reports he was taking  Lithium but the kidney doctor Zetta Bills, MD) thinks this caused him to develop CKD. Therefore the medication was discontinued per recommendations of Zetta Bills, MD. Patient reports subsequently he was prescribed a replacement medication (Depakote) from psychiatry [place] (Alemu Mengitsu). Reports Depakote made him sick causing vomiting etc. Reports he self-discontinued Depakote and all sick symptoms resolved.   Reports he doesn't think he needs psychiatrist stating "I have been stable for 21 years since the divorce." Reports he is currently having family related issues with trying to assist his daughter to get custody of her children. Also, reports because his daughter isn't able to see her children as a result he cannot see his grandchildren which affects him emotionally and states "But I haven't cracked."  Reports he plans to get updated insurance when he turns 65 years-old in December. Reports at that time he may consider returning to psychiatry if he feels he needs it.      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   Diabetes mellitus without complication (HCC) 10/08/2017   Keratoconjunctivitis sicca of both  eyes not specified as Sjogren's 10/08/2017   Long term current use of oral hypoglycemic drug 10/08/2017   Nuclear sclerotic cataract of both eyes 10/08/2017   Presbyopia of both eyes 10/08/2017   Vitreous floaters, bilateral 10/08/2017   Benign paroxysmal positional vertigo 11/15/2016   Impacted cerumen of left ear 11/15/2016   Needs flu shot 11/15/2016   Hyperlipemia 07/07/2016   Overweight  (BMI 25.0-29.9) 07/07/2016   Essential hypertension 04/05/2016   Type 2 diabetes mellitus with hyperglycemia (HCC) 04/05/2016   Frozen shoulder 09/24/2014   Shoulder impingement syndrome 09/24/2014     Current Outpatient Medications on File Prior to Visit  Medication Sig Dispense Refill   Dulaglutide 1.5 MG/0.5ML SOPN Inject 1.5 mg into the skin once a week. 6 mL 0   glucose blood (TRUE METRIX BLOOD GLUCOSE TEST) test strip Use as instructed 100 each 12   lithium carbonate 300 MG capsule Take 300 mg by mouth 2 (two) times daily.     triamcinolone ointment (KENALOG) 0.5 % Apply 1 application. topically 2 (two) times daily. 60 g 1   venlafaxine (EFFEXOR) 75 MG tablet Take 75 mg by mouth 2 (two) times daily with a meal.     No current facility-administered medications on file prior to visit.    No Known Allergies  Social History   Socioeconomic History   Marital status: Divorced    Spouse name: Not on file   Number of children: Not on file   Years of education: Not on file   Highest education level: Not on file  Occupational History   Not on file  Tobacco Use   Smoking status: Never    Passive exposure: Never   Smokeless tobacco: Never  Vaping Use   Vaping Use: Never used  Substance and Sexual Activity   Alcohol use: No   Drug use: No   Sexual activity: Not on file  Other Topics Concern   Not on file  Social History Narrative   Not on file   Social Determinants of Health   Financial Resource Strain: Not on file  Food Insecurity: Not on file  Transportation Needs: Not on file  Physical Activity: Not on file  Stress: Not on file  Social Connections: Not on file  Intimate Partner Violence: Not on file    Family History  Problem Relation Age of Onset   Hypertension Mother    Diabetes Father    Cancer Father     No past surgical history on file.  ROS: Review of Systems Negative except as stated above  PHYSICAL EXAM: BP 130/83 (BP Location: Left Arm,  Patient Position: Sitting, Cuff Size: Normal)   Pulse 81   Temp 98.3 F (36.8 C)   Resp 16   Ht 5' 11.42" (1.814 m)   Wt 207 lb (93.9 kg)   SpO2 94%   BMI 28.53 kg/m    Physical Exam HENT:     Head: Normocephalic and atraumatic.  Eyes:     Extraocular Movements: Extraocular movements intact.     Conjunctiva/sclera: Conjunctivae normal.     Pupils: Pupils are equal, round, and reactive to light.  Cardiovascular:     Rate and Rhythm: Normal rate and regular rhythm.     Pulses: Normal pulses.     Heart sounds: Normal heart sounds.  Pulmonary:     Effort: Pulmonary effort is normal.     Breath sounds: Normal breath sounds.  Musculoskeletal:     Cervical back: Normal range of motion and  neck supple.  Neurological:     General: No focal deficit present.     Mental Status: He is alert and oriented to person, place, and time.  Psychiatric:        Mood and Affect: Mood normal.        Behavior: Behavior normal.    Results for orders placed or performed in visit on 05/30/22  POCT glycosylated hemoglobin (Hb A1C)  Result Value Ref Range   Hemoglobin A1C 10.8 (A) 4.0 - 5.6 %   HbA1c POC (<> result, manual entry)     HbA1c, POC (prediabetic range)     HbA1c, POC (controlled diabetic range)      ASSESSMENT AND PLAN: 1. Type 2 diabetes mellitus with hyperglycemia, without long-term current use of insulin (HCC) - Hemoglobin A1c today not at goal at 10.8%, goal < 7%. This is increased from previous 7.6% on 03/06/2022. - Continue Metformin, Glipizide, and Pioglitazone as prescribed  - Patient unable to take Dulaglutide related to financial concerns.  - Discussed with patient recommendation to begin low dose basal Insulin Glargine and patient declined.  - Discussed the importance of healthy eating habits, low-carbohydrate diet, low-sugar diet, regular aerobic exercise (at least 150 minutes a week as tolerated) and medication compliance to achieve or maintain control of diabetes. -  Referral to Endocrinology for further evaluation and management.  - POCT glycosylated hemoglobin (Hb A1C) - metFORMIN (GLUCOPHAGE) 1000 MG tablet; Take 1 tablet (1,000 mg total) by mouth 2 (two) times daily.  Dispense: 60 tablet; Refill: 3 - glipiZIDE (GLUCOTROL) 10 MG tablet; Take 1 tablet (10 mg total) by mouth 2 (two) times daily with a meal.  Dispense: 60 tablet; Refill: 3 - pioglitazone (ACTOS) 45 MG tablet; Take 1 tablet (45 mg total) by mouth daily.  Dispense: 30 tablet; Refill: 3 - Ambulatory referral to Endocrinology - insulin glargine (LANTUS) 100 UNIT/ML injection; Inject 0.1 mLs (10 Units total) into the skin at bedtime.  Dispense: 10 mL; Refill: 0 - Insulin Pen Needle (PEN NEEDLES) 31G X 8 MM MISC; UAD  Dispense: 100 each; Refill: 0  2. Essential (primary) hypertension - Continue Lisinopril and Amlodipine as prescribed.  - Counseled on blood pressure goal of less than 130/80, low-sodium, DASH diet, medication compliance, and 150 minutes of moderate intensity exercise per week as tolerated. Counseled on medication adherence and adverse effects. - Follow-up with primary provider in 4 months or sooner if needed.  - lisinopril (ZESTRIL) 5 MG tablet; Take 1 tablet (5 mg total) by mouth daily.  Dispense: 30 tablet; Refill: 3 - amLODipine (NORVASC) 5 MG tablet; Take 1 tablet (5 mg total) by mouth daily.  Dispense: 30 tablet; Refill: 3  3. Mixed dyslipidemia - Continue Rosuvastatin as prescribed.  - Follow-up with primary provider as scheduled. - rosuvastatin (CRESTOR) 20 MG tablet; Take 1 tablet (20 mg total) by mouth daily.  Dispense: 90 tablet; Refill: 1  4. Anxiety and depression 5. Bipolar affective disorder, remission status unspecified (HCC) - Patient denies thoughts of self-harm, suicidal ideations, homicidal ideations. - I had a lengthy conversation with patient regarding my recommendations for him to continue care at Psychiatry [facility] We discussed risks of discontinuing  care at Psychiatry. Also, discussed that I do not prescribe any antipsychotics, benzodiazepines, or any additional psychiatric-related medications outside of standard SSRIs, SNRIs, and antianxiety medications. Counseled patient that I will need an official statement of medical clearance from Psychiatry MD prior to me managing. Patient reported to me that he is not returning  to Psychiatry whether they agree with him discontinuing care or not. Patient verbalized understanding of our discussion and my recommendations. Patient reported to me that he plans to call [facility] soon to see what their recommendations are.     Patient was given the opportunity to ask questions.  Patient verbalized understanding of the plan and was able to repeat key elements of the plan. Patient was given clear instructions to go to Emergency Department or return to medical center if symptoms don't improve, worsen, or new problems develop.The patient verbalized understanding.   Orders Placed This Encounter  Procedures   Ambulatory referral to Endocrinology   POCT glycosylated hemoglobin (Hb A1C)     Requested Prescriptions   Signed Prescriptions Disp Refills   metFORMIN (GLUCOPHAGE) 1000 MG tablet 60 tablet 3    Sig: Take 1 tablet (1,000 mg total) by mouth 2 (two) times daily.   glipiZIDE (GLUCOTROL) 10 MG tablet 60 tablet 3    Sig: Take 1 tablet (10 mg total) by mouth 2 (two) times daily with a meal.   pioglitazone (ACTOS) 45 MG tablet 30 tablet 3    Sig: Take 1 tablet (45 mg total) by mouth daily.   lisinopril (ZESTRIL) 5 MG tablet 30 tablet 3    Sig: Take 1 tablet (5 mg total) by mouth daily.   amLODipine (NORVASC) 5 MG tablet 30 tablet 3    Sig: Take 1 tablet (5 mg total) by mouth daily.   rosuvastatin (CRESTOR) 20 MG tablet 90 tablet 1    Sig: Take 1 tablet (20 mg total) by mouth daily.   insulin glargine (LANTUS) 100 UNIT/ML injection 10 mL 0    Sig: Inject 0.1 mLs (10 Units total) into the skin at bedtime.    Insulin Pen Needle (PEN NEEDLES) 31G X 8 MM MISC 100 each 0    Sig: UAD    Return in about 4 months (around 09/29/2022) for Follow-Up or next available chronic care management .  Rema Fendt, NP

## 2022-05-30 ENCOUNTER — Ambulatory Visit (INDEPENDENT_AMBULATORY_CARE_PROVIDER_SITE_OTHER): Payer: Self-pay | Admitting: Family

## 2022-05-30 VITALS — BP 130/83 | HR 81 | Temp 98.3°F | Resp 16 | Ht 71.42 in | Wt 207.0 lb

## 2022-05-30 DIAGNOSIS — F419 Anxiety disorder, unspecified: Secondary | ICD-10-CM

## 2022-05-30 DIAGNOSIS — F319 Bipolar disorder, unspecified: Secondary | ICD-10-CM

## 2022-05-30 DIAGNOSIS — E782 Mixed hyperlipidemia: Secondary | ICD-10-CM

## 2022-05-30 DIAGNOSIS — E1165 Type 2 diabetes mellitus with hyperglycemia: Secondary | ICD-10-CM

## 2022-05-30 DIAGNOSIS — I10 Essential (primary) hypertension: Secondary | ICD-10-CM

## 2022-05-30 DIAGNOSIS — F32A Depression, unspecified: Secondary | ICD-10-CM

## 2022-05-30 LAB — POCT GLYCOSYLATED HEMOGLOBIN (HGB A1C): Hemoglobin A1C: 10.8 % — AB (ref 4.0–5.6)

## 2022-05-30 MED ORDER — ROSUVASTATIN CALCIUM 20 MG PO TABS
20.0000 mg | ORAL_TABLET | Freq: Every day | ORAL | 1 refills | Status: DC
Start: 2022-05-30 — End: 2022-09-30

## 2022-05-30 MED ORDER — INSULIN GLARGINE 100 UNIT/ML ~~LOC~~ SOLN: 10.0000 [IU] | Freq: Every evening | SUBCUTANEOUS | 0 refills | Status: DC

## 2022-05-30 MED ORDER — AMLODIPINE BESYLATE 5 MG PO TABS: 5.0000 mg | ORAL_TABLET | Freq: Every day | ORAL | 3 refills | Status: DC

## 2022-05-30 MED ORDER — PIOGLITAZONE HCL 45 MG PO TABS: 45.0000 mg | ORAL_TABLET | Freq: Every day | ORAL | 3 refills | Status: DC

## 2022-05-30 MED ORDER — METFORMIN HCL 1000 MG PO TABS: 1000.0000 mg | ORAL_TABLET | Freq: Two times a day (BID) | ORAL | 3 refills | Status: DC

## 2022-05-30 MED ORDER — PEN NEEDLES 31G X 8 MM MISC: 0 refills | Status: DC

## 2022-05-30 MED ORDER — GLIPIZIDE 10 MG PO TABS: 10.0000 mg | ORAL_TABLET | Freq: Two times a day (BID) | ORAL | 3 refills | Status: DC

## 2022-05-30 MED ORDER — LISINOPRIL 5 MG PO TABS: 5.0000 mg | ORAL_TABLET | Freq: Every day | ORAL | 3 refills | Status: DC

## 2022-05-30 NOTE — Progress Notes (Signed)
.  Pt presents for chronic care management   

## 2022-05-30 NOTE — Patient Instructions (Signed)

## 2022-06-06 ENCOUNTER — Other Ambulatory Visit: Payer: Self-pay | Admitting: Family

## 2022-06-06 ENCOUNTER — Ambulatory Visit: Payer: Self-pay

## 2022-06-06 NOTE — Telephone Encounter (Signed)
  Chief Complaint: Wants to start insulin Symptoms: high blood glucose Frequency: ongoing Pertinent Negatives: Patient denies Any s/s of  Disposition: [] ED /[] Urgent Care (no appt availability in office) / [] Appointment(In office/virtual)/ []  [place] [facility]/ [] Home Care/ [] Refused Recommended Disposition /[] [place] [facility]/ [x]  Follow-up with PCP Additional Notes: Pt states that when he was seen in the office recently PCP suggested starting insulin and pt declined. Pt has changed his mind and would like to start insulin.  PT contacted endocrinologist and 1st available appt is January 2024. Please return pt's call to start insulin. Reason for Disposition  [1] Blood glucose 240 - 300 mg/dL (40.9 - 81.1 mmol/L) AND [2] uses insulin (e.g., insulin-dependent, all people with type 1 diabetes)  Answer Assessment - Initial Assessment Questions 1. BLOOD GLUCOSE: "What is your blood glucose level?"      Today - 249  2. ONSET: "When did you check the blood glucose?"     Just now  while on phone 3. USUAL RANGE: "What is your glucose level usually?" (e.g., usual fasting morning value, usual evening value)     120-140 4. KETONES: "Do you check for ketones (urine or blood test strips)?" If yes, ask: "What does the test show now?"      Does not test 5. TYPE 1 or 2:  "Do you know what type of diabetes you have?"  (e.g., Type 1, Type 2, Gestational; doesn't know)      Type diabetes - 14 years 6. INSULIN: "Do you take insulin?" "What type of insulin(s) do you use? What is the mode of delivery? (syringe, pen; injection or pump)?"      no 7. DIABETES PILLS: "Do you take any pills for your diabetes?" If yes, ask: "Have you missed taking any pills recently?"     Takes medication 8. OTHER SYMPTOMS: "Do you have any symptoms?" (e.g., fever, frequent urination, difficulty breathing, dizziness, weakness, vomiting)     None 9. PREGNANCY: "Is there any chance you are pregnant?" "When was your last  menstrual period?"     na  Protocols used: Diabetes - High Blood Sugar-A-AH

## 2022-06-06 NOTE — Telephone Encounter (Signed)
Insulin Glargine ordered during recent appointment 05/30/2022. Confirm with pharmacy availability for pickup. Schedule 4 week follow-up with Butch Penny, CPP-RPH at [facility].

## 2022-06-07 NOTE — Telephone Encounter (Signed)
Att to contact pt to advise of new medication, no ans lvm advising that provider has started him on Lantus or Semglee whichever the pharmacy has in stock but same medication, lvm informing pt that 4 week f/u is needed w/Luke Ausdvall at [facility]

## 2022-07-17 ENCOUNTER — Ambulatory Visit: Payer: Self-pay | Attending: Family | Admitting: Pharmacist

## 2022-07-17 DIAGNOSIS — E119 Type 2 diabetes mellitus without complications: Secondary | ICD-10-CM

## 2022-07-17 MED ORDER — INSULIN GLARGINE-YFGN 100 UNIT/ML ~~LOC~~ SOPN: 16.0000 [IU] | PEN_INJECTOR | Freq: Every day | SUBCUTANEOUS | 2 refills | Status: DC

## 2022-07-17 MED ORDER — GLIPIZIDE 5 MG PO TABS
5.0000 mg | ORAL_TABLET | Freq: Two times a day (BID) | ORAL | 2 refills | Status: DC
Start: 2022-07-17 — End: 2022-09-30

## 2022-07-17 NOTE — Progress Notes (Signed)
    S:    PCP: Ricky Stabs   No chief complaint on file.  Alan Reeves is a 66 y.o. male who presents for diabetes evaluation, education, and management. PMH is significant for T2DM, HTN, and HLD. Patient was referred and last seen by Primary Care Provider, Ricky Stabs, NP, on 05/30/2022. At that visit, A1c had increased from 7.6 to 10.8. Patient was initially hesitant to start insulin but was later agreeable.  Today, patient arrives in good spirits and presents without any assistance. Reports his blood sugars have much improved since starting insulin. He has not yet scheduled an appointment with endocrinology but will do so today.   Patient reports Diabetes was diagnosed ~14 years ago.   Family/Social History:   Current diabetes medications include: glipizide 10 mg BID, metformin 1000 mg BID, pioglitazone 45 mg daily, Semglee 10 units daily  -Previously on Trulicity (also experienced itching) and Farxiga but unable to afford   Patient reports adherence to taking all medications as prescribed.   Do you feel that your medications are working for you? Yes, improved blood glucose control since starting insulin Have you been experiencing any side effects to the medications prescribed? no Do you have any problems obtaining medications due to transportation or finances? Yes, unable to afford Trulicity or [place]  Patient denies hypoglycemic events.  Reported home fasting blood sugars: last checked Saturday- 170 mg/dL. Mostly seeing 200s. Before starting insulin BG 300-450s.  Patient reports nocturia (nighttime urination). 1x/night Patient denies neuropathy (nerve pain). Patient denies visual changes. Patient reports self foot exams.   Patient reported dietary habits: Eats 2 meals/day Breakfast: granola bar Lunch: 2 [employer] roast beef sandwiches Dinner: pizza (3 slices)  Drinks: zero sugar lemonade, unsweet tea, once in a while Mellow Yellow   O:  Lab Results  Component Value Date    HGBA1C 10.8 (A) 05/30/2022   There were no vitals filed for this visit.  Lipid Panel     Component Value Date/Time   CHOL 87 (L) 11/23/2021 1507   TRIG 230 (H) 11/23/2021 1507   HDL 32 (L) 11/23/2021 1507   CHOLHDL 2.7 11/23/2021 1507   CHOLHDL 4.8 03/31/2014 1609   VLDL 73 (H) 03/31/2014 1609   LDLCALC 20 11/23/2021 1507   A/P: Diabetes longstanding currently uncontrolled based on A1c. Patient is able to verbalize appropriate hypoglycemia management plan. Medication adherence appears appropriate. Control is suboptimal due to current regimen. -Increased dose of basal insulin Semglee (insulin glargine) to 16 units daily -Decreased glipizide to 5 mg BID given concurrent insulin use. Consider discontinuing glipizide at follow up.   -Continued metformin 1000 mg BID and pioglitazone 45 mg daily -Extensively discussed pathophysiology of diabetes, recommended lifestyle interventions, dietary effects on blood sugar control.  -Counseled on s/sx of and management of hypoglycemia.  -Next A1c anticipated September 2023.   Written patient instructions provided. Patient verbalized understanding of treatment plan.  Total time in face to face counseling 25 minutes.    Follow-up:  Pharmacist 1 month. PCP clinic visit on 09/29/22.   Valeda Malm, Pharm.D. PGY-2 Ambulatory Care Pharmacy Resident 07/17/2022 12:26 PM

## 2022-08-22 ENCOUNTER — Ambulatory Visit: Payer: Self-pay | Admitting: Pharmacist

## 2022-08-29 ENCOUNTER — Ambulatory Visit: Payer: Self-pay | Admitting: "Endocrinology

## 2022-09-20 NOTE — Progress Notes (Unsigned)
Patient ID: Alan Reeves, male    DOB: 04-09-1956  MRN: [id number]  CC: Chronic Care Management  Subjective: Alan Reeves is a 66 y.o. male who presents for chronic care management.   His concerns today include:  - Doing well on blood pressure and cholesterol medications, no issues/concerns.  - Doing well on diabetes medications, no issues/concerns. States he never went to Endocrinology referral appointment because it was too far to drive to [place], [place]. Requests referral to [facility].  - Reports he now has health insurance and ready for referral for diabetic eye exam and colon cancer screening. Reports history of colon polyp.  - Patient states referring to psychiatric-related medications  "You said you would manage my medications." I discussed with patient that I did not make that statement. I discussed in detail with patient during appointment 05/30/2022 with me the need for him to get medical clearance of release from his psychiatric provider in order for me to manage his anxiety/depression and bipolar. Patient states he currently has 3 days of Venlafaxine left which will last through 10/02/2022. Patient states he is ok with returning to Psychiatry he just doesn't want to return to [facility]. States "[facility] doesn't do anything except ask the same questions you do." Denies thoughts of self-harm, suicidal ideations, and homicidal ideations.   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   Diabetes mellitus without complication ([place]) 96/22/2979   Keratoconjunctivitis sicca of both eyes not specified as Sjogren's 10/08/2017   Long term current use of oral hypoglycemic drug 10/08/2017   Nuclear sclerotic cataract of both eyes 10/08/2017   Presbyopia of both eyes 10/08/2017   Vitreous floaters, bilateral 10/08/2017    Benign paroxysmal positional vertigo 11/15/2016   Impacted cerumen of left ear 11/15/2016   Needs flu shot 11/15/2016   Hyperlipemia 07/07/2016   Overweight (BMI 25.0-29.9) 07/07/2016   Essential hypertension 04/05/2016   Type 2 diabetes mellitus with hyperglycemia ([place]) 04/05/2016   Frozen shoulder 09/24/2014   Shoulder impingement syndrome 09/24/2014     Current Outpatient Medications on File Prior to Visit  Medication Sig Dispense Refill   glucose blood (TRUE METRIX BLOOD GLUCOSE TEST) test strip Use as instructed 100 each 12   insulin glargine-yfgn (SEMGLEE, YFGN,) 100 UNIT/ML Pen Inject 16 Units into the skin daily. 15 mL 2   Insulin Pen Needle (PEN NEEDLES) 31G X 8 MM MISC UAD 100 each 0   lithium carbonate 300 MG capsule Take 300 mg by mouth 2 (two) times daily.     triamcinolone ointment (KENALOG) 0.5 % Apply 1 application. topically 2 (two) times daily. 60 g 1   venlafaxine (EFFEXOR) 75 MG tablet Take 75 mg by mouth 2 (two) times daily with a meal.     No current facility-administered medications on file prior to visit.    No Known Allergies  Social History   Socioeconomic History   Marital status: Divorced    Spouse name: Not on file   Number of children: Not on file   Years of education: Not on file   Highest education level: Not on file  Occupational History   Not on file  Tobacco Use   Smoking status: Never    Passive exposure: Never   Smokeless tobacco: Never  Vaping Use   Vaping Use: Never used  Substance and Sexual  Activity   Alcohol use: No   Drug use: No   Sexual activity: Not on file  Other Topics Concern   Not on file  Social History Narrative   Not on file   Social Determinants of Health   Financial Resource Strain: Not on file  Food Insecurity: Not on file  Transportation Needs: Not on file  Physical Activity: Not on file  Stress: Not on file  Social Connections: Not on file  Intimate Partner Violence: Not on file    Family History   Problem Relation Age of Onset   Hypertension Mother    Diabetes Father    Cancer Father     No past surgical history on file.  ROS: Review of Systems Negative except as stated above  PHYSICAL EXAM: BP 124/83 (BP Location: Left Arm, Patient Position: Sitting, Cuff Size: Normal)   Pulse 63   Temp 98.3 F (36.8 C)   Resp 16   Ht 5' 11.42" (1.814 m)   Wt 207 lb (93.9 kg)   SpO2 98%   BMI 28.53 kg/m   Physical Exam HENT:     Head: Normocephalic and atraumatic.  Eyes:     Extraocular Movements: Extraocular movements intact.     Conjunctiva/sclera: Conjunctivae normal.     Pupils: Pupils are equal, round, and reactive to light.  Cardiovascular:     Rate and Rhythm: Normal rate and regular rhythm.     Pulses: Normal pulses.     Heart sounds: Normal heart sounds.  Pulmonary:     Effort: Pulmonary effort is normal.     Breath sounds: Normal breath sounds.  Musculoskeletal:     Cervical back: Normal range of motion and neck supple.  Neurological:     General: No focal deficit present.     Mental Status: He is alert and oriented to person, place, and time.  Psychiatric:        Mood and Affect: Mood normal.        Behavior: Behavior normal.    Results for orders placed or performed in visit on 66/59/93  Basic metabolic panel  Result Value Ref Range   Glucose 217 (H) 70 - 99 mg/dL   BUN 14 8 - 27 mg/dL   Creatinine, Ser 1.23 0.76 - 1.27 mg/dL   eGFR 66 >59 mL/min/1.73   BUN/Creatinine Ratio 11 10 - 24   Sodium 139 134 - 144 mmol/L   Potassium 4.8 3.5 - 5.2 mmol/L   Chloride 103 96 - 106 mmol/L   CO2 20 20 - 29 mmol/L   Calcium 9.1 8.6 - 10.2 mg/dL  POCT glycosylated hemoglobin (Hb A1C)  Result Value Ref Range   Hemoglobin A1C 8.4 (A) 4.0 - 5.6 %   HbA1c POC (<> result, manual entry)     HbA1c, POC (prediabetic range)     HbA1c, POC (controlled diabetic range)      ASSESSMENT AND PLAN: 1. Type 2 diabetes mellitus with hyperglycemia, without long-term current  use of insulin (H[facility]) - Hemoglobin A1c not at goal at 8.4%, goal 7%. However, improved from previous 10.8% on 05/30/2022. - Microalbumin creatinine urine ratio pending.  - Continue Pioglitazone, Metformin, and Glipizide as prescribed.  - Discussed the importance of healthy eating habits, low-carbohydrate diet, low-sugar diet, regular aerobic exercise (at least 150 minutes a week as tolerated) and medication compliance to achieve or maintain control of diabetes. - Patient states he canceled appointment on 08/29/2022 with [facility] due to the commute. Today he  is requesting referral to [facility].  - Referral to Endocrinology for further evaluation and management. - POCT glycosylated hemoglobin (Hb A1C) - Microalbumin / creatinine urine ratio - Ambulatory referral to Endocrinology - pioglitazone (ACTOS) 45 MG tablet; Take 1 tablet (45 mg total) by mouth daily.  Dispense: 30 tablet; Refill: 2 - metFORMIN (GLUCOPHAGE) 1000 MG tablet; Take 1 tablet (1,000 mg total) by mouth 2 (two) times daily.  Dispense: 60 tablet; Refill: 2 - glipiZIDE (GLUCOTROL) 5 MG tablet; Take 1 tablet (5 mg total) by mouth 2 (two) times daily before a meal.  Dispense: 60 tablet; Refill: 2  2. Diabetic eye exam [facility]) - Referral to Ophthalmology for further evaluation and management.  - Ambulatory referral to Ophthalmology  3. Primary hypertension - Continue Amlodipine and Lisinopril as prescribed.  - Counseled on blood pressure goal of less than 130/80, low-sodium, DASH diet, medication compliance, and 150 minutes of moderate intensity exercise per week as tolerated. Counseled on medication adherence and adverse effects. - Update BMP. - Follow-up with primary provider in 3 months or sooner if needed.  - Basic metabolic panel - amLODipine (NORVASC) 5 MG tablet; Take 1 tablet (5 mg total) by mouth daily.  Dispense: 30 tablet; Refill: 2 - lisinopril (ZESTRIL) 5 MG tablet; Take 1 tablet (5 mg  total) by mouth daily.  Dispense: 30 tablet; Refill: 2  4. Hyperlipidemia, unspecified hyperlipidemia type - Continue Rosuvastatin as prescribed.  - Follow-up with primary provider as scheduled.  - rosuvastatin (CRESTOR) 20 MG tablet; Take 1 tablet (20 mg total) by mouth daily.  Dispense: 30 tablet; Refill: 2  5. Anxiety and depression 6. Bipolar affective disorder, remission status unspecified ([place]) - Patient denies thoughts of self-harm, suicidal ideations, homicidal ideations. - Today patient states referring to psychiatric-related medications  "You said you would manage my medications." I discussed with patient that I did not make that statement. I discussed in detail with patient during appointment 05/30/2022 with me the need for him to get medical clearance of release from his psychiatric provider in order for me to manage his anxiety/depression and bipolar. Patient states he currently has 3 days of Venlafaxine left which will last through 10/02/2022. Patient states he is ok with returning to Psychiatry he just doesn't want to return to [facility]. States Oncologist doesn't do anything except ask the same questions you do."  - Patient provided with contact information to [facility]. Patient reports he will go sometime this weekend to [facility] for medication refills. Patient is aware to notify me of any barriers he may experience with the same.  - Referral to Psychiatry for further evaluation and management.   - Ambulatory referral to Psychiatry  7. Colon cancer screening 8. History of colonic polyps - Referral to Gastroenterology for further evaluation and management.  - Ambulatory referral to Gastroenterology  9. Need for immunization against influenza - Administered.  - Flu Vaccine QUAD High Dose(Fluad)   Patient was given the opportunity to ask questions.  Patient verbalized understanding of the plan and was able to repeat key  elements of the plan. Patient was given clear instructions to go to Emergency Department or return to medical center if symptoms don't improve, worsen, or new problems develop.The patient verbalized understanding.   Orders Placed This Encounter  Procedures   Flu Vaccine QUAD High Dose(Fluad)   Microalbumin / creatinine urine ratio   Basic metabolic panel   Ambulatory referral to Ophthalmology   Ambulatory referral to  Endocrinology   Ambulatory referral to Psychiatry   POCT glycosylated hemoglobin (Hb A1C)     Requested Prescriptions   Signed Prescriptions Disp Refills   pioglitazone (ACTOS) 45 MG tablet 30 tablet 2    Sig: Take 1 tablet (45 mg total) by mouth daily.   metFORMIN (GLUCOPHAGE) 1000 MG tablet 60 tablet 2    Sig: Take 1 tablet (1,000 mg total) by mouth 2 (two) times daily.   glipiZIDE (GLUCOTROL) 5 MG tablet 60 tablet 2    Sig: Take 1 tablet (5 mg total) by mouth 2 (two) times daily before a meal.   amLODipine (NORVASC) 5 MG tablet 30 tablet 2    Sig: Take 1 tablet (5 mg total) by mouth daily.   lisinopril (ZESTRIL) 5 MG tablet 30 tablet 2    Sig: Take 1 tablet (5 mg total) by mouth daily.   rosuvastatin (CRESTOR) 20 MG tablet 30 tablet 2    Sig: Take 1 tablet (20 mg total) by mouth daily.    Return in about 3 months (around 12/30/2022) for Follow-Up or next available chronic care mgmt.  Camillia Herter, NP

## 2022-09-29 ENCOUNTER — Ambulatory Visit: Payer: Commercial Managed Care - HMO | Admitting: Family

## 2022-09-29 VITALS — BP 124/83 | HR 63 | Temp 98.3°F | Resp 16 | Ht 71.42 in | Wt 207.0 lb

## 2022-09-29 DIAGNOSIS — Z23 Encounter for immunization: Secondary | ICD-10-CM

## 2022-09-29 DIAGNOSIS — I10 Essential (primary) hypertension: Secondary | ICD-10-CM

## 2022-09-29 DIAGNOSIS — E785 Hyperlipidemia, unspecified: Secondary | ICD-10-CM | POA: Diagnosis not present

## 2022-09-29 DIAGNOSIS — Z86010 Personal history of colonic polyps: Secondary | ICD-10-CM

## 2022-09-29 DIAGNOSIS — E1165 Type 2 diabetes mellitus with hyperglycemia: Secondary | ICD-10-CM

## 2022-09-29 DIAGNOSIS — Z0100 Encounter for examination of eyes and vision without abnormal findings: Secondary | ICD-10-CM | POA: Diagnosis not present

## 2022-09-29 DIAGNOSIS — E119 Type 2 diabetes mellitus without complications: Secondary | ICD-10-CM

## 2022-09-29 DIAGNOSIS — F32A Depression, unspecified: Secondary | ICD-10-CM

## 2022-09-29 DIAGNOSIS — F319 Bipolar disorder, unspecified: Secondary | ICD-10-CM

## 2022-09-29 DIAGNOSIS — F419 Anxiety disorder, unspecified: Secondary | ICD-10-CM

## 2022-09-29 DIAGNOSIS — Z1211 Encounter for screening for malignant neoplasm of colon: Secondary | ICD-10-CM

## 2022-09-29 LAB — POCT GLYCOSYLATED HEMOGLOBIN (HGB A1C): Hemoglobin A1C: 8.4 % — AB (ref 4.0–5.6)

## 2022-09-29 NOTE — Progress Notes (Unsigned)
.  Pt presents for chronic care management   -needs refill on venlafaxine   8.4 A1C

## 2022-09-30 LAB — BASIC METABOLIC PANEL
BUN/Creatinine Ratio: 11 (ref 10–24)
BUN: 14 mg/dL (ref 8–27)
CO2: 20 mmol/L (ref 20–29)
Calcium: 9.1 mg/dL (ref 8.6–10.2)
Chloride: 103 mmol/L (ref 96–106)
Creatinine, Ser: 1.23 mg/dL (ref 0.76–1.27)
Glucose: 217 mg/dL — ABNORMAL HIGH (ref 70–99)
Potassium: 4.8 mmol/L (ref 3.5–5.2)
Sodium: 139 mmol/L (ref 134–144)
eGFR: 66 mL/min/{1.73_m2} (ref >59)

## 2022-09-30 MED ORDER — LISINOPRIL 5 MG PO TABS: 5.0000 mg | ORAL_TABLET | Freq: Every day | ORAL | 2 refills | Status: DC

## 2022-09-30 MED ORDER — GLIPIZIDE 5 MG PO TABS: 5.0000 mg | ORAL_TABLET | Freq: Two times a day (BID) | ORAL | 2 refills | Status: DC

## 2022-09-30 MED ORDER — PIOGLITAZONE HCL 45 MG PO TABS: 45.0000 mg | ORAL_TABLET | Freq: Every day | ORAL | 2 refills | Status: DC

## 2022-09-30 MED ORDER — ROSUVASTATIN CALCIUM 20 MG PO TABS: 20.0000 mg | ORAL_TABLET | Freq: Every day | ORAL | 2 refills | Status: DC

## 2022-09-30 MED ORDER — METFORMIN HCL 1000 MG PO TABS: 1000.0000 mg | ORAL_TABLET | Freq: Two times a day (BID) | ORAL | 2 refills | Status: DC

## 2022-09-30 MED ORDER — AMLODIPINE BESYLATE 5 MG PO TABS: 5.0000 mg | ORAL_TABLET | Freq: Every day | ORAL | 2 refills | Status: DC

## 2022-10-03 ENCOUNTER — Encounter: Payer: Self-pay | Admitting: Pharmacist

## 2022-10-03 ENCOUNTER — Ambulatory Visit: Payer: Commercial Managed Care - HMO | Attending: Family | Admitting: Pharmacist

## 2022-10-03 ENCOUNTER — Ambulatory Visit (INDEPENDENT_AMBULATORY_CARE_PROVIDER_SITE_OTHER): Payer: Commercial Managed Care - HMO | Admitting: Psychiatry

## 2022-10-03 ENCOUNTER — Encounter (HOSPITAL_COMMUNITY): Payer: Self-pay | Admitting: Psychiatry

## 2022-10-03 ENCOUNTER — Ambulatory Visit (INDEPENDENT_AMBULATORY_CARE_PROVIDER_SITE_OTHER): Payer: Commercial Managed Care - HMO | Admitting: Licensed Clinical Social Worker

## 2022-10-03 VITALS — BP 140/69 | HR 80 | Wt 215.0 lb

## 2022-10-03 DIAGNOSIS — F319 Bipolar disorder, unspecified: Secondary | ICD-10-CM | POA: Diagnosis not present

## 2022-10-03 DIAGNOSIS — E119 Type 2 diabetes mellitus without complications: Secondary | ICD-10-CM

## 2022-10-03 DIAGNOSIS — F3341 Major depressive disorder, recurrent, in partial remission: Secondary | ICD-10-CM

## 2022-10-03 MED ORDER — VENLAFAXINE HCL 75 MG PO TABS: 75.0000 mg | ORAL_TABLET | Freq: Two times a day (BID) | ORAL | 3 refills | Status: DC

## 2022-10-03 MED ORDER — INSULIN GLARGINE-YFGN 100 UNIT/ML ~~LOC~~ SOPN: 20.0000 [IU] | PEN_INJECTOR | Freq: Every day | SUBCUTANEOUS | 2 refills | Status: DC
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
## 2022-10-03 NOTE — Progress Notes (Signed)
    S:    PCP: Durene Fruits   No chief complaint on file.  Alan Reeves is a 66 y.o. male who presents for diabetes evaluation, education, and management. PMH is significant for T2DM, HTN, and HLD. Patient was referred and last seen by Primary Care Provider, Durene Fruits, NP, on 09/29/2022. At that visit, A1c showed improved DM control at 8.4%.   Today, patient arrives in good spirits and presents without any assistance. Reports his blood sugars have remained improved since starting insulin. He has not been able to schedule with Endo but his referral has been submitted and he is waiting for them to call and schedule.   Patient reports Diabetes was diagnosed ~14 years ago.   Family/Social History:  -Fhx: HTN, DM -Tobacco: never smoker  -Alcohol: none reported  Current diabetes medications include: glipizide 5 mg BID, metformin 1000 mg BID, pioglitazone 45 mg daily, Semglee 16 units daily  -Previously on Trulicity (experienced itching) and Farxiga but unable to afford   Patient reports adherence to taking all medications as prescribed.   Patient denies hypoglycemic events.  Reported home fasting blood sugars: 130s - 230s. Hardly ever notices any readings >300 like before insulin.  Patient denies polyuria. Patient denies neuropathy (nerve pain). Patient denies visual changes. Patient reports self foot exams.   Patient reported dietary habits: Eats 2 meals/day Breakfast: granola bar Lunch: 2 [employer] roast beef sandwiches Dinner: pizza (3 slices)  Drinks: zero sugar lemonade, unsweet tea, once in a while Mellow Yellow   O:  Lab Results  Component Value Date   HGBA1C 8.4 (A) 09/29/2022   There were no vitals filed for this visit.  Lipid Panel     Component Value Date/Time   CHOL 87 (L) 11/23/2021 1507   TRIG 230 (H) 11/23/2021 1507   HDL 32 (L) 11/23/2021 1507   CHOLHDL 2.7 11/23/2021 1507   CHOLHDL 4.8 03/31/2014 1609   VLDL 73 (H) 03/31/2014 1609   LDLCALC 20  11/23/2021 1507   A/P: Diabetes longstanding currently uncontrolled based on A1c. Patient is able to verbalize appropriate hypoglycemia management plan. Medication adherence appears appropriate. Control is suboptimal due to current regimen. -Increased dose of basal insulin Semglee (insulin glargine) to 20 units daily -Continue glipizide 5 mg BID, metformin 1000 mg BID and pioglitazone 45 mg daily -Extensively discussed pathophysiology of diabetes, recommended lifestyle interventions, dietary effects on blood sugar control.  -Counseled on s/sx of and management of hypoglycemia.  -Next A1c anticipated 12/2021.   Written patient instructions provided. Patient verbalized understanding of treatment plan.  Total time in face to face counseling 25 minutes.    Follow-up:  Pharmacist 1-1.5 months.  Benard Halsted, PharmD, [place], [place] [phone]

## 2022-10-03 NOTE — Progress Notes (Signed)
Psychiatric Initial Adult Assessment   Patient Identification: Alan Reeves MRN:  [id number] Date of Evaluation:  10/03/2022 Referral Source: Walk-in Chief Complaint:  "I need refills. I will run out"  Visit Diagnosis:    ICD-10-CM   1. Recurrent major depressive disorder, in partial remission (HCC)  F33.41 venlafaxine (EFFEXOR) 75 MG tablet      History of Present Illness: 66 year old male seen today for initial psychiatric evaluation.  He walked into the clinic for medication management.  He has a psychiatric history of bipolar disorder, anxiety, and depression.  Patient notes that he has not been treated for bipolar disorder in over 20 years.  He notes that he was once prescribed lithium but was taken off of it due to increased sodium levels.  Patient is now managed on Effexor 75 mg twice daily and reports that it is effective in managing his psychiatric conditions.  Today he is well-groomed, pleasant, cooperative, engaged in conversation, maintained eye contact.  He informed Clinical research associate that he is in need of refills as he is running out of his Effexor.  Patient informed writer that his mood is stable and notes that his anxiety and depression are well managed.  Provider conducted a GAD-7 and patient scored a 0.  Provider also conducted PHQ-9 and patient scored a 1.  He endorses adequate sleep and appetite.  Today he denies SI/HI/VAH, mania, paranoia.  Patient informed Clinical research associate that when he was younger he misused alcohol.  He notes that he has not drank alcohol in over 30 years.  Patient now notes that he finds enjoyment watching TV, spending time with his daughter/grandchild, and working as a Tax adviser.  No medication changes made today.  Patient agreeable to continue medication as prescribed.  No other concerns at this time.  Associated Signs/Symptoms: Depression Symptoms:   Denies (Hypo) Manic Symptoms:   Denies Anxiety Symptoms:   Denies Psychotic Symptoms:   Denies PTSD  Symptoms: Denies  Past Psychiatric History: Bipolar disorder (in remission), anxiety, and depression  Previous Psychotropic Medications:  Lithium (increased NA), Prozac, elavil, Effexor, Seroquel, and lamictal  Substance Abuse History in the last 12 months:  No.  Consequences of Substance Abuse: NA  Past Medical History:  Past Medical History:  Diagnosis Date   Anxiety    Depression    Diabetes mellitus without complication (HCC)    Hyperlipidemia    Hypertension    No past surgical history on file.  Family Psychiatric History: Maternal uncles alcohol use, Sister bipolar disorder, paternal aunts alcohol use  Family History:  Family History  Problem Relation Age of Onset   Hypertension Mother    Diabetes Father    Cancer Father     Social History:   Social History   Socioeconomic History   Marital status: Divorced    Spouse name: Not on file   Number of children: Not on file   Years of education: Not on file   Highest education level: Not on file  Occupational History   Not on file  Tobacco Use   Smoking status: Never    Passive exposure: Never   Smokeless tobacco: Never  Vaping Use   Vaping Use: Never used  Substance and Sexual Activity   Alcohol use: No   Drug use: No   Sexual activity: Not on file  Other Topics Concern   Not on file  Social History Narrative   Not on file   Social Determinants of Health   Financial Resource Strain: Low Risk  (  10/03/2022)   Overall Financial Resource Strain (CARDIA)    Difficulty of Paying Living Expenses: Not hard at all  Food Insecurity: No Food Insecurity (10/03/2022)   Hunger Vital Sign    Worried About Running Out of Food in the Last Year: Never true    Ran Out of Food in the Last Year: Never true  Transportation Needs: No Transportation Needs (10/03/2022)   PRAPARE - Administrator, Civil Service (Medical): No    Lack of Transportation (Non-Medical): No  Physical Activity: Inactive (10/03/2022)    Exercise Vital Sign    Days of Exercise per Week: 0 days    Minutes of Exercise per Session: 0 min  Stress: No Stress Concern Present (10/03/2022)   [employer] of Occupational Health - Occupational Stress Questionnaire    Feeling of Stress : Not at all  Social Connections: Socially Isolated (10/03/2022)   Social Connection and Isolation Panel [NHANES]    Frequency of Communication with Friends and Family: More than three times a week    Frequency of Social Gatherings with Friends and Family: Once a week    Attends Religious Services: Never    Database administrator or Organizations: No    Attends Banker Meetings: Never    Marital Status: Divorced    Additional Social History: Patient resides in [place]. He is divorced. She has one daughter. He works as a Museum/gallery exhibitions officer. He denise tobacco or alcohol use. He denies illegal drug use.   Allergies:  No Known Allergies  Metabolic Disorder Labs: Lab Results  Component Value Date   HGBA1C 8.4 (A) 09/29/2022   No results found for: "PROLACTIN" Lab Results  Component Value Date   CHOL 87 (L) 11/23/2021   TRIG 230 (H) 11/23/2021   HDL 32 (L) 11/23/2021   CHOLHDL 2.7 11/23/2021   VLDL 73 (H) 03/31/2014   LDLCALC 20 11/23/2021   LDLCALC 100 (H) 02/18/2021   Lab Results  Component Value Date   TSH 0.831 02/18/2021    Therapeutic Level Labs: Lab Results  Component Value Date   LITHIUM 0.5 10/26/2021   No results found for: "CBMZ" No results found for: "VALPROATE"  Current Medications: Current Outpatient Medications  Medication Sig Dispense Refill   amLODipine (NORVASC) 5 MG tablet Take 1 tablet (5 mg total) by mouth daily. 30 tablet 2   glipiZIDE (GLUCOTROL) 5 MG tablet Take 1 tablet (5 mg total) by mouth 2 (two) times daily before a meal. 60 tablet 2   glucose blood (TRUE METRIX BLOOD GLUCOSE TEST) test strip Use as instructed 100 each 12   insulin glargine-yfgn (SEMGLEE, YFGN,) 100 UNIT/ML Pen Inject 20  Units into the skin daily. 15 mL 2   Insulin Pen Needle (PEN NEEDLES) 31G X 8 MM MISC UAD 100 each 0   lisinopril (ZESTRIL) 5 MG tablet Take 1 tablet (5 mg total) by mouth daily. 30 tablet 2   metFORMIN (GLUCOPHAGE) 1000 MG tablet Take 1 tablet (1,000 mg total) by mouth 2 (two) times daily. 60 tablet 2   pioglitazone (ACTOS) 45 MG tablet Take 1 tablet (45 mg total) by mouth daily. 30 tablet 2   rosuvastatin (CRESTOR) 20 MG tablet Take 1 tablet (20 mg total) by mouth daily. 30 tablet 2   triamcinolone ointment (KENALOG) 0.5 % Apply 1 application. topically 2 (two) times daily. 60 g 1   venlafaxine (EFFEXOR) 75 MG tablet Take 1 tablet (75 mg total) by mouth 2 (two) times daily with  a meal. 60 tablet 3   No current facility-administered medications for this visit.    Musculoskeletal: Strength & Muscle Tone: within normal limits Gait & Station: normal Patient leans: N/A  Psychiatric Specialty Exam: Review of Systems  There were no vitals taken for this visit.There is no height or weight on file to calculate BMI.  General Appearance: Well Groomed  Eye Contact:  Good  Speech:  Clear and Coherent and Normal Rate  Volume:  Normal  Mood:  Anxious and Depressed  Affect:  Appropriate and Congruent  Thought Process:  Coherent, Goal Directed, and Linear  Orientation:  Full (Time, Place, and Person)  Thought Content:  WDL and Logical  Suicidal Thoughts:  No  Homicidal Thoughts:  No  Memory:  Immediate;   Good Recent;   Good Remote;   Good  Judgement:  Good  Insight:  Good  Psychomotor Activity:  Normal  Concentration:  Concentration: Good and Attention Span: Good  Recall:  Good  Fund of Knowledge:Good  Language: Good  Akathisia:  No  Handed:  Right  AIMS (if indicated):  not done  Assets:  Communication Skills Desire for Improvement Financial Resources/Insurance Housing Physical Health Social Support Transportation  ADL's:  Intact  Cognition: WNL  Sleep:  Poor    Screenings: GAD-7    Advertising copywriter from 10/03/2022 in [facility]  Total GAD-7 Score 0      PHQ2-9    Flowsheet Row Counselor from 10/03/2022 in [facility] Most recent reading at 10/03/2022 10:25 AM Counselor from 10/03/2022 in [facility] Most recent reading at 10/03/2022  8:16 AM Office Visit from 05/30/2022 in Primary Care at [facility] Most recent reading at 05/30/2022  3:01 PM Office Visit from 04/04/2022 in Primary Care at [facility] Most recent reading at 04/04/2022  2:14 PM Office Visit from 03/06/2022 in Primary Care at [facility] Most recent reading at 03/06/2022  2:32 PM  PHQ-2 Total Score 0 0 0 0 0  PHQ-9 Total Score 1 -- -- -- --      Flowsheet Row Counselor from 10/03/2022 in [facility] Most recent reading at 10/03/2022 10:25 AM Counselor from 10/03/2022 in [facility] Most recent reading at 10/03/2022  8:16 AM ED from 08/12/2021 in [facility]  Most recent reading at 08/12/2021 12:10 PM  C-SSRS RISK CATEGORY No Risk No Risk No Risk       Assessment and Plan: Patient notes that he is doing well on his currently medication regimen. No medication changes made today. Patient agreeable to continue medications as prescribed.      Collaboration of Care: Other provider involved in patient's care AEB PCP  Patient/Guardian was advised Release of Information must be obtained prior to any record release in order to collaborate their care with an outside provider. Patient/Guardian was advised if they have not already done so to contact the registration department to sign all necessary forms in order for [place] to release information regarding their care.   1. Recurrent major depressive disorder, in partial remission (HCC)  Continue- venlafaxine (EFFEXOR) 75 MG tablet; Take 1 tablet (75 mg  total) by mouth 2 (two) times daily with a meal.  Dispense: 60 tablet; Refill: 3   Consent: Patient/Guardian gives verbal consent for treatment and assignment of benefits for services provided during this visit. Patient/Guardian expressed understanding and agreed to proceed.   Darnice Comrie E  Ronne Binning, NP 10/24/202311:23 AM

## 2022-10-26 ENCOUNTER — Emergency Department (HOSPITAL_BASED_OUTPATIENT_CLINIC_OR_DEPARTMENT_OTHER): Payer: Commercial Managed Care - HMO | Admitting: Radiology

## 2022-10-26 ENCOUNTER — Emergency Department (HOSPITAL_BASED_OUTPATIENT_CLINIC_OR_DEPARTMENT_OTHER): Payer: Commercial Managed Care - HMO

## 2022-10-26 ENCOUNTER — Other Ambulatory Visit: Payer: Self-pay

## 2022-10-26 ENCOUNTER — Emergency Department (HOSPITAL_BASED_OUTPATIENT_CLINIC_OR_DEPARTMENT_OTHER)
Admission: EM | Admit: 2022-10-26 | Discharge: 2022-10-26 | Disposition: A | Discharge status: Home / Self Care | Payer: Commercial Managed Care - HMO | Attending: Emergency Medicine | Admitting: Emergency Medicine

## 2022-10-26 ENCOUNTER — Encounter (HOSPITAL_BASED_OUTPATIENT_CLINIC_OR_DEPARTMENT_OTHER): Payer: Self-pay | Admitting: Emergency Medicine

## 2022-10-26 DIAGNOSIS — S62112A Displaced fracture of triquetrum [cuneiform] bone, left wrist, initial encounter for closed fracture: Secondary | ICD-10-CM

## 2022-10-26 DIAGNOSIS — S20211A Contusion of right front wall of thorax, initial encounter: Secondary | ICD-10-CM

## 2022-10-26 DIAGNOSIS — Z79899 Other long term (current) drug therapy: Secondary | ICD-10-CM | POA: Insufficient documentation

## 2022-10-26 DIAGNOSIS — S62115A Nondisplaced fracture of triquetrum [cuneiform] bone, left wrist, initial encounter for closed fracture: Secondary | ICD-10-CM | POA: Insufficient documentation

## 2022-10-26 DIAGNOSIS — S6992XA Unspecified injury of left wrist, hand and finger(s), initial encounter: Secondary | ICD-10-CM | POA: Diagnosis present

## 2022-10-26 DIAGNOSIS — Z7984 Long term (current) use of oral hypoglycemic drugs: Secondary | ICD-10-CM | POA: Diagnosis not present

## 2022-10-26 DIAGNOSIS — Z794 Long term (current) use of insulin: Secondary | ICD-10-CM | POA: Insufficient documentation

## 2022-10-26 DIAGNOSIS — Y92410 Unspecified street and highway as the place of occurrence of the external cause: Secondary | ICD-10-CM | POA: Insufficient documentation

## 2022-10-26 LAB — TROPONIN I (HIGH SENSITIVITY): Troponin I (High Sensitivity): 4 ng/L (ref <18)
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
## 2022-10-26 NOTE — ED Notes (Signed)
ED Provider at bedside. 

## 2022-10-26 NOTE — ED Notes (Signed)
Bedside portable xray in progress

## 2022-10-26 NOTE — Discharge Instructions (Signed)
Your x-ray shows a possible chip fracture off the bone called the triquetrum in your wrist.  Wear the splint as prescribed and follow-up with a hand surgeon.  Return to the ED with new or worsening symptoms.

## 2022-10-27 NOTE — ED Notes (Signed)
Pt agreeable with d/c plan as discussed by provider- this nurse has verbally reinforced d/c instructions and provided pt with written copy; pt acknowledges verbal understanding and denies any addl questions concerns needs- pt ambulatory at d/c independently with steady gait; no distress -- LUE splint dry and intact; LUE PMS intact.
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
## 2022-11-08 ENCOUNTER — Ambulatory Visit: Payer: Commercial Managed Care - HMO | Admitting: Family

## 2022-11-08 ENCOUNTER — Telehealth: Payer: Self-pay | Admitting: Family

## 2022-11-08 DIAGNOSIS — S20211D Contusion of right front wall of thorax, subsequent encounter: Secondary | ICD-10-CM | POA: Diagnosis not present

## 2022-11-08 DIAGNOSIS — S62112D Displaced fracture of triquetrum [cuneiform] bone, left wrist, subsequent encounter for fracture with routine healing: Secondary | ICD-10-CM | POA: Diagnosis not present
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
## 2022-11-14 ENCOUNTER — Other Ambulatory Visit: Payer: Self-pay | Admitting: Family

## 2022-11-14 DIAGNOSIS — F32A Depression, unspecified: Secondary | ICD-10-CM

## 2022-11-14 DIAGNOSIS — F319 Bipolar disorder, unspecified: Secondary | ICD-10-CM

## 2022-11-14 NOTE — Telephone Encounter (Signed)
Psychiatry order complete. Patient states he prefers Endocrinology office in [place]. Patient stated previous Endocrinology referral located in [place] and that it was too far for him to commute. If we can accommodate patient's request. Thank you.

## 2022-11-16 ENCOUNTER — Ambulatory Visit (INDEPENDENT_AMBULATORY_CARE_PROVIDER_SITE_OTHER): Payer: Commercial Managed Care - HMO

## 2022-11-16 ENCOUNTER — Ambulatory Visit: Payer: Commercial Managed Care - HMO | Admitting: Orthopaedic Surgery

## 2022-11-16 DIAGNOSIS — M79642 Pain in left hand: Secondary | ICD-10-CM | POA: Diagnosis not present
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
## 2022-12-22 NOTE — Progress Notes (Signed)
Patient ID: Alan Reeves, male    DOB: 10/16/1956  MRN: [id number]  CC: Chronic Care Management  Subjective: Alan Reeves is a 67 y.o. male who presents for chronic care management.  His concerns today include:  - Doing well on blood pressure and cholesterol medications, no issues/concerns. Denies red flag symptoms. - Reports he has not established with [facility] for diabetes management as of yet. States their office called him to schedule an appointment and he missed the call. States when he called back they never picked up the phone. Reports he went to their office in person to try and schedule an appointment. Reports he was told he cannot schedule in person and that he must call them to schedule an appointment. He is taking diabetes medications as prescribed, no issues/concerns. Denies red flag symptoms.   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   Diabetes mellitus without complication ([place]) 40/98/1191   Keratoconjunctivitis sicca of both eyes not specified as Sjogren's 10/08/2017   Long term current use of oral hypoglycemic drug 10/08/2017   Nuclear sclerotic cataract of both eyes 10/08/2017   Presbyopia of both eyes 10/08/2017   Vitreous floaters, bilateral 10/08/2017   Benign paroxysmal positional vertigo 11/15/2016   Impacted cerumen of left ear 11/15/2016   Needs flu shot 11/15/2016   Hyperlipemia 07/07/2016   Overweight (BMI 25.0-29.9) 07/07/2016   Essential hypertension 04/05/2016   Type 2 diabetes mellitus with hyperglycemia ([place]) 04/05/2016   Shoulder impingement syndrome 09/24/2014     Current Outpatient Medications on File Prior to Visit  Medication Sig Dispense Refill   glipiZIDE (GLUCOTROL) 5 MG tablet Take 1 tablet (5 mg total) by mouth 2 (two) times daily before a meal. 60 tablet 2    glucose blood (TRUE METRIX BLOOD GLUCOSE TEST) test strip Use as instructed 100 each 12   insulin glargine-yfgn (SEMGLEE, YFGN,) 100 UNIT/ML Pen Inject 20 Units into the skin daily. 15 mL 2   Insulin Pen Needle (PEN NEEDLES) 31G X 8 MM MISC UAD 100 each 0   metFORMIN (GLUCOPHAGE) 1000 MG tablet Take 1 tablet (1,000 mg total) by mouth 2 (two) times daily. 60 tablet 2   pioglitazone (ACTOS) 45 MG tablet Take 1 tablet (45 mg total) by mouth daily. 30 tablet 2   triamcinolone ointment (KENALOG) 0.5 % Apply 1 application. topically 2 (two) times daily. 60 g 1   venlafaxine (EFFEXOR) 75 MG tablet Take 1 tablet (75 mg total) by mouth 2 (two) times daily with a meal. 60 tablet 3   No current facility-administered medications on file prior to visit.    No Known Allergies  Social History   Socioeconomic History   Marital status: Divorced    Spouse name: Not on file   Number of children: Not on file   Years of education: Not on file   Highest education level: Not on file  Occupational History   Not on file  Tobacco Use   Smoking status: Never    Passive exposure: Never   Smokeless tobacco: Never  Vaping Use   Vaping Use: Never used  Substance and Sexual Activity   Alcohol use: No   Drug use: No   Sexual activity: Not on file  Other Topics Concern   Not on file  Social History Narrative   Not on file  Social Determinants of Health   Financial Resource Strain: Low Risk  (10/03/2022)   Overall Financial Resource Strain (CARDIA)    Difficulty of Paying Living Expenses: Not hard at all  Food Insecurity: No Food Insecurity (10/03/2022)   Hunger Vital Sign    Worried About Running Out of Food in the Last Year: Never true    Ran Out of Food in the Last Year: Never true  Transportation Needs: No Transportation Needs (10/03/2022)   PRAPARE - Hydrologist (Medical): No    Lack of Transportation (Non-Medical): No  Physical Activity: Inactive (10/03/2022)    Exercise Vital Sign    Days of Exercise per Week: 0 days    Minutes of Exercise per Session: 0 min  Stress: No Stress Concern Present (10/03/2022)   [place]    Feeling of Stress : Not at all  Social Connections: Socially Isolated (10/03/2022)   Social Connection and Isolation Panel [NHANES]    Frequency of Communication with Friends and Family: More than three times a week    Frequency of Social Gatherings with Friends and Family: Once a week    Attends Religious Services: Never    Marine scientist or Organizations: No    Attends Archivist Meetings: Never    Marital Status: Divorced  Human resources officer Violence: Not on file    Family History  Problem Relation Age of Onset   Hypertension Mother    Diabetes Father    Cancer Father     No past surgical history on file.  ROS: Review of Systems Negative except as stated above  PHYSICAL EXAM: BP 112/79 (BP Location: Left Arm, Patient Position: Sitting, Cuff Size: Large)   Pulse 85   Temp 98.3 F (36.8 C)   Resp 16   Ht 5' 11.42" (1.814 m)   Wt 215 lb (97.5 kg)   SpO2 96%   BMI 29.64 kg/m   Physical Exam HENT:     Head: Normocephalic and atraumatic.  Eyes:     Extraocular Movements: Extraocular movements intact.     Conjunctiva/sclera: Conjunctivae normal.     Pupils: Pupils are equal, round, and reactive to light.  Cardiovascular:     Rate and Rhythm: Normal rate and regular rhythm.     Pulses: Normal pulses.     Heart sounds: Normal heart sounds.  Pulmonary:     Effort: Pulmonary effort is normal.     Breath sounds: Normal breath sounds.  Musculoskeletal:     Cervical back: Normal range of motion and neck supple.  Neurological:     General: No focal deficit present.     Mental Status: He is alert and oriented to person, place, and time.  Psychiatric:        Mood and Affect: Mood normal.        Behavior: Behavior normal.    ASSESSMENT AND PLAN: 1. Primary hypertension - Continue Amlodipine and Lisinopril as prescribed.  - Counseled on blood pressure goal of less than 140/90, low-sodium, DASH diet, medication compliance, 150 minutes of moderate intensity exercise per week as tolerated. Discussed medication compliance, adverse effects. - Follow-up with primary provider in 3 months or sooner if needed.  - amLODipine (NORVASC) 5 MG tablet; Take 1 tablet (5 mg total) by mouth daily.  Dispense: 30 tablet; Refill: 2 - lisinopril (ZESTRIL) 5 MG tablet; Take 1 tablet (5 mg total) by mouth daily.  Dispense: 30 tablet;  Refill: 2  2. Hyperlipidemia, unspecified hyperlipidemia type - Continue Rosuvastatin as prescribed.  - Follow-up with primary provider in 3 months or sooner if needed.  - rosuvastatin (CRESTOR) 20 MG tablet; Take 1 tablet (20 mg total) by mouth daily.  Dispense: 30 tablet; Refill: 2  3. Type 2 diabetes mellitus with hyperglycemia, without long-term current use of insulin (HCC) - Routine screening. Will update medication regimen once lab results.  - Microalbumin / creatinine urine ratio - POCT glycosylated hemoglobin (Hb A1C)   Patient was given the opportunity to ask questions.  Patient verbalized understanding of the plan and was able to repeat key elements of the plan. Patient was given clear instructions to go to Emergency Department or return to medical center if symptoms don't improve, worsen, or new problems develop.The patient verbalized understanding.   Orders Placed This Encounter  Procedures   POCT glycosylated hemoglobin (Hb A1C)     Requested Prescriptions   Signed Prescriptions Disp Refills   amLODipine (NORVASC) 5 MG tablet 30 tablet 2    Sig: Take 1 tablet (5 mg total) by mouth daily.   lisinopril (ZESTRIL) 5 MG tablet 30 tablet 2    Sig: Take 1 tablet (5 mg total) by mouth daily.   rosuvastatin (CRESTOR) 20 MG tablet 30 tablet 2    Sig: Take 1 tablet (20 mg total) by mouth  daily.    Return in about 3 months (around 03/28/2023) for Follow-Up or next available chronic care mgmt .  Rema Fendt, NP

## 2022-12-27 ENCOUNTER — Ambulatory Visit (INDEPENDENT_AMBULATORY_CARE_PROVIDER_SITE_OTHER): Payer: 59 | Admitting: Family

## 2022-12-27 VITALS — BP 112/79 | HR 85 | Temp 98.3°F | Resp 16 | Ht 71.42 in | Wt 215.0 lb

## 2022-12-27 DIAGNOSIS — I10 Essential (primary) hypertension: Secondary | ICD-10-CM

## 2022-12-27 DIAGNOSIS — E1165 Type 2 diabetes mellitus with hyperglycemia: Secondary | ICD-10-CM

## 2022-12-27 DIAGNOSIS — E785 Hyperlipidemia, unspecified: Secondary | ICD-10-CM | POA: Diagnosis not present

## 2022-12-27 LAB — POCT GLYCOSYLATED HEMOGLOBIN (HGB A1C): Hemoglobin A1C: 8.7 % — AB (ref 4.0–5.6)

## 2022-12-27 MED ORDER — AMLODIPINE BESYLATE 5 MG PO TABS: 5.0000 mg | ORAL_TABLET | Freq: Every day | ORAL | 2 refills | Status: DC

## 2022-12-27 MED ORDER — LISINOPRIL 5 MG PO TABS: 5.0000 mg | ORAL_TABLET | Freq: Every day | ORAL | 2 refills | Status: DC

## 2022-12-27 MED ORDER — ROSUVASTATIN CALCIUM 20 MG PO TABS: 20.0000 mg | ORAL_TABLET | Freq: Every day | ORAL | 2 refills | Status: DC

## 2022-12-27 NOTE — Addendum Note (Signed)
Addended by: Elmon Else on: 12/27/2022 04:06 PM   Modules accepted: Orders

## 2022-12-27 NOTE — Progress Notes (Signed)
.  Pt presents for chronic care management   

## 2022-12-27 NOTE — Patient Instructions (Signed)
Please call [facility] to schedule an appointment.  Phone number:  [phone]  [address] [place] [address]    Amlodipine Tablets What is this medication? AMLODIPINE (am LOE di peen) treats high blood pressure and prevents chest pain (angina). It works by relaxing the blood vessels, which helps decrease the amount of work your heart has to do. It belongs to a group of medications called calcium channel blockers. This medicine may be used for other purposes; ask your health care provider or pharmacist if you have questions. COMMON BRAND NAME(S): Norvasc What should I tell my care team before I take this medication? They need to know if you have any of these conditions: Heart disease Liver disease An unusual or allergic reaction to amlodipine, other medications, foods, dyes, or preservatives Pregnant or trying to get pregnant Breastfeeding How should I use this medication? Take this medication by mouth. Take it as directed on the prescription label at the same time every day. You can take it with or without food. If it upsets your stomach, take it with food. Keep taking it unless your care team tells you to stop. Talk to your care team about the use of this medication in children. While it may be prescribed for children as young as 6 for selected conditions, precautions do apply. Overdosage: If you think you have taken too much of this medicine contact a poison control center or emergency room at once. NOTE: This medicine is only for you. Do not share this medicine with others. What if I miss a dose? If you miss a dose, take it as soon as you can. If it is almost time for your next dose, take only that dose. Do not take double or extra doses. What may interact with this medication? Clarithromycin Cyclosporine Diltiazem Itraconazole Simvastatin Tacrolimus This list may not describe all possible interactions. Give your health care provider a list of  all the medicines, herbs, non-prescription drugs, or dietary supplements you use. Also tell them if you smoke, drink alcohol, or use illegal drugs. Some items may interact with your medicine. What should I watch for while using this medication? Visit your care team for regular checks on your progress. Check your blood pressure as directed. Know what your blood pressure should be and when to contact your care team. Do not treat yourself for coughs, colds, or pain while you are using this medication without asking your care team for advice. Some medications may increase your blood pressure. This medication may affect your coordination, reaction time, or judgment. Do not drive or operate machinery until you know how this medication affects you. Sit up or stand slowly to reduce the risk of dizzy or fainting spells. Drinking alcohol with this medication can increase the risk of these side effects. What side effects may I notice from receiving this medication? Side effects that you should report to your care team as soon as possible: Allergic reactions--skin rash, itching, hives, swelling of the face, lips, tongue, or throat Heart attack--pain or tightness in the chest, shoulders, arms, or jaw, nausea, shortness of breath, cold or clammy skin, feeling faint or lightheaded Low blood pressure--dizziness, feeling faint or lightheaded, blurry vision Worsening chest pain (angina)--pain, pressure, or tightness in the chest, neck, back, or arms Side effects that usually do not require medical attention (report these to your care team if they continue or are bothersome): Facial flushing, redness Heart palpitations--rapid, pounding, or irregular heartbeat Nausea Stomach pain Swelling of the  ankles, hands, or feet This list may not describe all possible side effects. Call your doctor for medical advice about side effects. You may report side effects to FDA at 1-800-FDA-1088. Where should I keep my  medication? Keep out of the reach of children and pets. Store at room temperature between 20 and 25 degrees C (68 and 77 degrees F). Protect from light and moisture. Keep the container tightly closed. Get rid of any unused medication after the expiration date. To get rid of medications that are no longer needed or have expired: Take the medication to a medication take-back program. Check with your pharmacy or law enforcement to find a location. If you cannot return the medication, check the label or package insert to see if the medication should be thrown out in the garbage or flushed down the toilet. If you are not sure, ask your care team. If it is safe to put in the trash, empty the medication out of the container. Mix the medication with cat litter, dirt, coffee grounds, or other unwanted substance. Seal the mixture in a bag or container. Put it in the trash. NOTE: This sheet is a summary. It may not cover all possible information. If you have questions about this medicine, talk to your doctor, pharmacist, or health care provider.  2023 Elsevier/Gold Standard (2022-06-19 00:00:00)

## 2022-12-28 ENCOUNTER — Telehealth: Payer: Self-pay | Admitting: Family

## 2022-12-28 ENCOUNTER — Other Ambulatory Visit: Payer: Self-pay | Admitting: Family

## 2022-12-28 DIAGNOSIS — E1165 Type 2 diabetes mellitus with hyperglycemia: Secondary | ICD-10-CM

## 2022-12-28 LAB — MICROALBUMIN / CREATININE URINE RATIO
Creatinine, Urine: 93.8 mg/dL
Microalb/Creat Ratio: 19 mg/g creat (ref 0–29)
Microalbumin, Urine: 18.2 ug/mL

## 2022-12-28 MED ORDER — METFORMIN HCL 1000 MG PO TABS: 1000.0000 mg | ORAL_TABLET | Freq: Two times a day (BID) | ORAL | 2 refills | Status: DC

## 2022-12-28 MED ORDER — GLIPIZIDE 5 MG PO TABS: 5.0000 mg | ORAL_TABLET | Freq: Two times a day (BID) | ORAL | 2 refills | Status: DC

## 2022-12-28 MED ORDER — PEN NEEDLES 31G X 8 MM MISC: 0 refills | Status: DC

## 2022-12-28 MED ORDER — INSULIN GLARGINE-YFGN 100 UNIT/ML ~~LOC~~ SOPN: 20.0000 [IU] | PEN_INJECTOR | Freq: Every day | SUBCUTANEOUS | 0 refills | Status: DC

## 2022-12-28 MED ORDER — PIOGLITAZONE HCL 45 MG PO TABS: 45.0000 mg | ORAL_TABLET | Freq: Every day | ORAL | 2 refills | Status: DC

## 2023-01-01 ENCOUNTER — Telehealth (HOSPITAL_COMMUNITY): Payer: 59 | Admitting: Psychiatry

## 2023-01-01 ENCOUNTER — Encounter (HOSPITAL_COMMUNITY): Payer: Self-pay

## 2023-01-09 ENCOUNTER — Telehealth (HOSPITAL_COMMUNITY): Payer: Self-pay | Admitting: Psychiatry

## 2023-01-09 ENCOUNTER — Encounter (HOSPITAL_COMMUNITY): Payer: Self-pay

## 2023-01-09 ENCOUNTER — Telehealth (HOSPITAL_COMMUNITY): Payer: 59 | Admitting: Psychiatry

## 2023-01-09 MED ORDER — VENLAFAXINE HCL 75 MG PO TABS: 75.0000 mg | ORAL_TABLET | Freq: Two times a day (BID) | ORAL | 3 refills | Status: DC

## 2023-01-09 NOTE — Telephone Encounter (Signed)
Medication [facility]

## 2023-01-12 ENCOUNTER — Other Ambulatory Visit: Payer: Self-pay | Admitting: Family

## 2023-01-12 ENCOUNTER — Telehealth: Payer: Self-pay | Admitting: Family

## 2023-01-12 DIAGNOSIS — E1165 Type 2 diabetes mellitus with hyperglycemia: Secondary | ICD-10-CM

## 2023-01-12 MED ORDER — BASAGLAR KWIKPEN 100 UNIT/ML ~~LOC~~ SOPN: 20.0000 [IU] | PEN_INJECTOR | Freq: Every day | SUBCUTANEOUS | 1 refills | Status: DC

## 2023-01-12 NOTE — Telephone Encounter (Signed)
Attempt to call and give message per PCP.  LMOVM.

## 2023-01-17 NOTE — Telephone Encounter (Signed)
Please call patient to make him aware. Thank you

## 2023-01-17 NOTE — Telephone Encounter (Signed)
Att to contact pt to advise that he needs to contact [facility] as they have sent letter in attempts to reach pt

## 2023-01-22 NOTE — Progress Notes (Signed)
Continue with present management. Please provide patient with contact information to [facility]. For any further issues/concerns schedule appointment with me.

## 2023-01-24 IMAGING — DX DG CHEST 2V
2 series · 2 of 2 positions shown · non-contrast
Comparison: None available

CLINICAL DATA: Cough and congestion, productive cough for 1 week

EXAM:
CHEST - 2 VIEW

[chest pa]
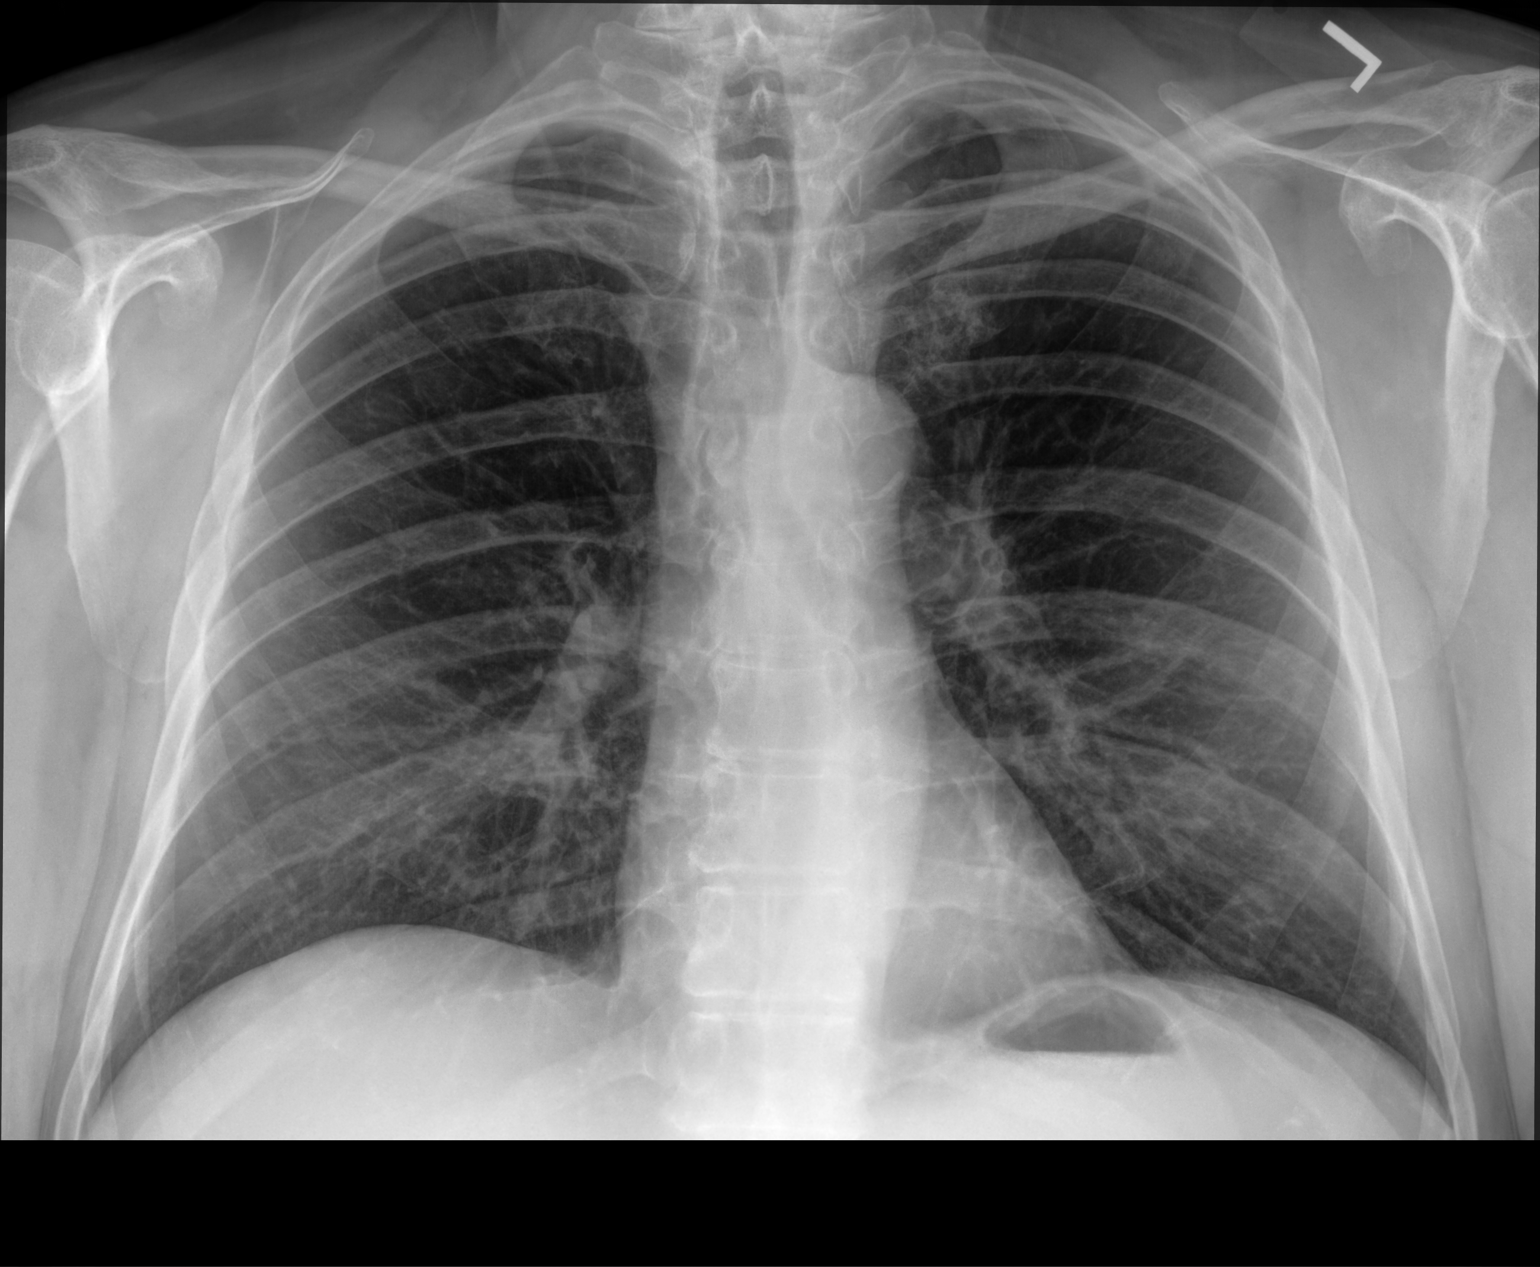

[chest lat]
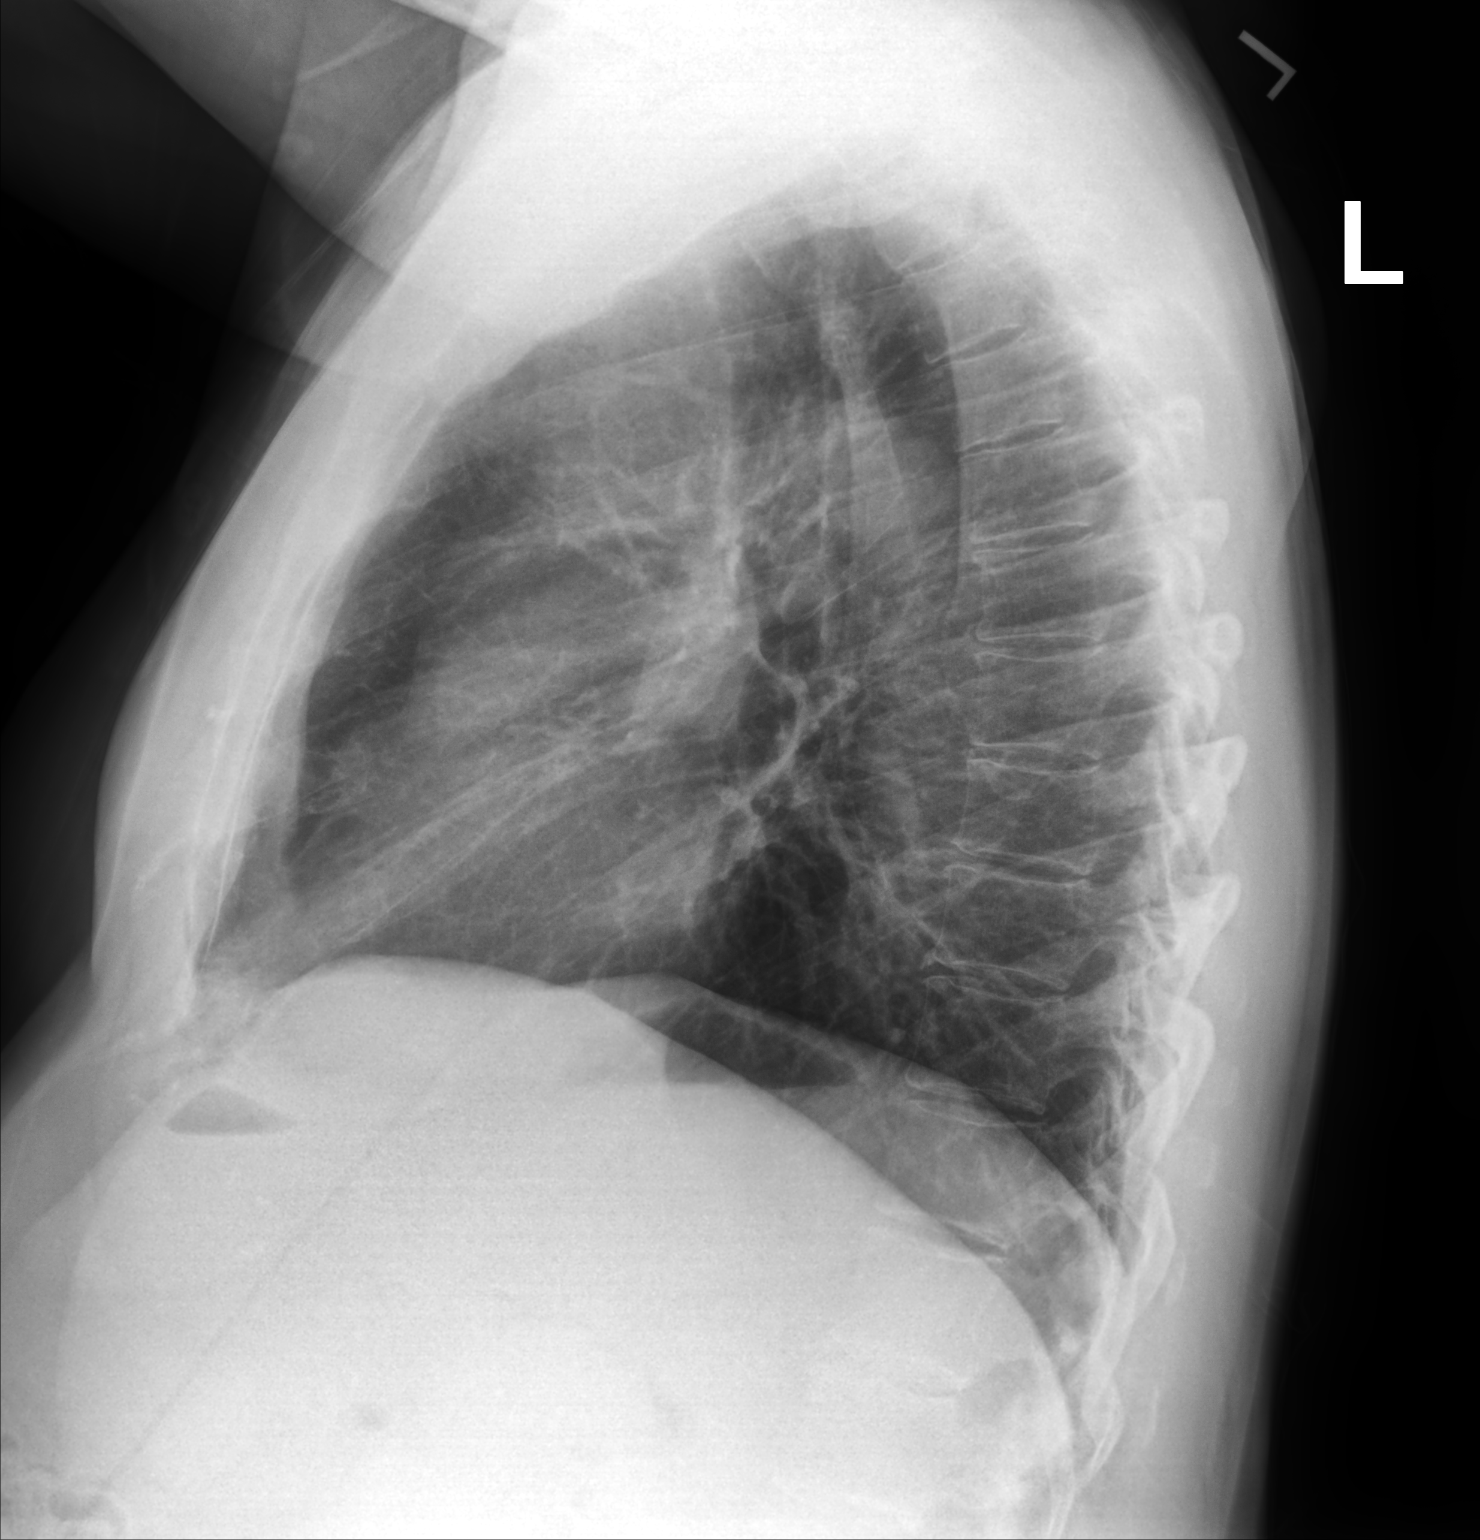

[2 of 2 positions shown; findings below may reference images not displayed]

FINDINGS: The heart size and mediastinal contours are within normal limits.
Both lungs are clear. The visualized skeletal structures are
unremarkable.

Trachea midline.  Aorta atherosclerotic.
IMPRESSION: No active cardiopulmonary disease.

## 2023-02-07 ENCOUNTER — Encounter (HOSPITAL_COMMUNITY): Payer: Self-pay | Admitting: Psychiatry

## 2023-02-07 ENCOUNTER — Telehealth (INDEPENDENT_AMBULATORY_CARE_PROVIDER_SITE_OTHER): Payer: 59 | Admitting: Psychiatry

## 2023-02-07 DIAGNOSIS — F3341 Major depressive disorder, recurrent, in partial remission: Secondary | ICD-10-CM | POA: Diagnosis not present

## 2023-02-07 MED ORDER — VENLAFAXINE HCL 75 MG PO TABS: 75.0000 mg | ORAL_TABLET | Freq: Two times a day (BID) | ORAL | 3 refills | Status: DC

## 2023-02-07 NOTE — Progress Notes (Signed)
BH MD/PA/NP OP Progress Note Virtual Visit via Video Note  I connected with Alan Reeves on 02/07/23 at  8:00 AM EST by a video enabled telemedicine application and verified that I am speaking with the correct person using two identifiers.  Location: Patient: Home Provider: Clinic   I discussed the limitations of evaluation and management by telemedicine and the availability of in person appointments. The patient expressed understanding and agreed to proceed.  I provided 30 minutes of non-face-to-face time during this encounter.   02/07/2023 8:48 AM Alan Reeves  MRN:  [id number]  Chief Complaint: No chief complaint on file.  HPI:  67 year old male seen today for follow-up psychiatric evaluation. He has a psychiatric history of bipolar disorder, anxiety, and depression.  He is currently managed on Effexor 75 mg twice daily and reports that it is effective in managing his psychiatric conditions.  Today he is well-groomed, pleasant, cooperative, engaged in conversation, maintained eye contact.  He informed Probation officer that he is doing well.  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  Patient notes that his anxiety and depression are well-managed.  Today provider conducted a GAD-7 and patient scored a 0, at his last visit he scored a 0.  Provider also conducted PHQ-9 and patient scored a 0, at his last visit he scored a 1.  He endorses adequate sleep and appetite.  Today he denies SI/HI/VAH, mania, paranoia.  No medication changes made today.  Patient agreeable to continue medication as prescribed.  No other concerns at this time. Visit Diagnosis:    ICD-10-CM   1. Recurrent major depressive disorder, in partial remission (HCC)  F33.41 venlafaxine (EFFEXOR) 75 MG tablet      Past Psychiatric History: bipolar disorder, anxiety, and depression  Past Medical History:  Past Medical History:  Diagnosis Date   Anxiety    Depression    Diabetes mellitus  without complication ([place])    Hyperlipidemia    Hypertension    History reviewed. No pertinent surgical history.  Family Psychiatric History:  Maternal uncles alcohol use, Sister bipolar disorder, paternal aunts alcohol use   Family History:  Family History  Problem Relation Age of Onset   Hypertension Mother    Diabetes Father    Cancer Father     Social History:  Social History   Socioeconomic History   Marital status: Divorced    Spouse name: Not on file   Number of children: Not on file   Years of education: Not on file   Highest education level: Not on file  Occupational History   Not on file  Tobacco Use   Smoking status: Never    Passive exposure: Never   Smokeless tobacco: Never  Vaping Use   Vaping Use: Never used  Substance and Sexual Activity   Alcohol use: No   Drug use: No   Sexual activity: Not on file  Other Topics Concern   Not on file  Social History Narrative   Not on file   Social Determinants of Health   Financial Resource Strain: Low Risk  (10/03/2022)   Overall Financial Resource Strain (CARDIA)    Difficulty of Paying Living Expenses: Not hard at all  Food Insecurity: No Food Insecurity (10/03/2022)   Hunger Vital Sign    Worried About Running Out of Food in the Last Year: Never true    [place] in the Last Year: Never true  Transportation Needs: No  Transportation Needs (10/03/2022)   PRAPARE - Hydrologist (Medical): No    Lack of Transportation (Non-Medical): No  Physical Activity: Inactive (10/03/2022)   Exercise Vital Sign    Days of Exercise per Week: 0 days    Minutes of Exercise per Session: 0 min  Stress: No Stress Concern Present (10/03/2022)   [place]    Feeling of Stress : Not at all  Social Connections: Socially Isolated (10/03/2022)   Social Connection and Isolation Panel [NHANES]    Frequency of Communication with  Friends and Family: More than three times a week    Frequency of Social Gatherings with Friends and Family: Once a week    Attends Religious Services: Never    Marine scientist or Organizations: No    Attends Music therapist: Never    Marital Status: Divorced    Allergies: No Known Allergies  Metabolic Disorder Labs: Lab Results  Component Value Date   HGBA1C 8.7 (A) 12/27/2022   No results found for: "PROLACTIN" Lab Results  Component Value Date   CHOL 87 (L) 11/23/2021   TRIG 230 (H) 11/23/2021   HDL 32 (L) 11/23/2021   CHOLHDL 2.7 11/23/2021   VLDL 73 (H) 03/31/2014   LDLCALC 20 11/23/2021   LDLCALC 100 (H) 02/18/2021   Lab Results  Component Value Date   TSH 0.831 02/18/2021   TSH 0.613 03/31/2014    Therapeutic Level Labs: Lab Results  Component Value Date   LITHIUM 0.5 10/26/2021   LITHIUM 0.6 06/27/2021   No results found for: "VALPROATE" No results found for: "CBMZ"  Current Medications: Current Outpatient Medications  Medication Sig Dispense Refill   amLODipine (NORVASC) 5 MG tablet Take 1 tablet (5 mg total) by mouth daily. 30 tablet 2   glipiZIDE (GLUCOTROL) 5 MG tablet Take 1 tablet (5 mg total) by mouth 2 (two) times daily before a meal. 60 tablet 2   glucose blood (TRUE METRIX BLOOD GLUCOSE TEST) test strip Use as instructed 100 each 12   Insulin Glargine (BASAGLAR KWIKPEN) 100 UNIT/ML Inject 20 Units into the skin daily. 15 mL 1   Insulin Pen Needle (PEN NEEDLES) 31G X 8 MM MISC UAD 100 each 0   lisinopril (ZESTRIL) 5 MG tablet Take 1 tablet (5 mg total) by mouth daily. 30 tablet 2   metFORMIN (GLUCOPHAGE) 1000 MG tablet Take 1 tablet (1,000 mg total) by mouth 2 (two) times daily. 60 tablet 2   pioglitazone (ACTOS) 45 MG tablet Take 1 tablet (45 mg total) by mouth daily. 30 tablet 2   rosuvastatin (CRESTOR) 20 MG tablet Take 1 tablet (20 mg total) by mouth daily. 30 tablet 2   triamcinolone ointment (KENALOG) 0.5 % Apply 1  application. topically 2 (two) times daily. 60 g 1   venlafaxine (EFFEXOR) 75 MG tablet Take 1 tablet (75 mg total) by mouth 2 (two) times daily with a meal. 60 tablet 3   No current facility-administered medications for this visit.     Musculoskeletal: Strength & Muscle Tone: within normal limits and Telehealth visit Gait & Station: normal, Telehealth visit Patient leans: N/A  Psychiatric Specialty Exam: Review of Systems  There were no vitals taken for this visit.There is no height or weight on file to calculate BMI.  General Appearance: Well Groomed  Eye Contact:  Good  Speech:  Clear and Coherent and Normal Rate  Volume:  Normal  Mood:  Euthymic  Affect:  Appropriate and Congruent  Thought Process:  Coherent, Goal Directed, and NA  Orientation:  Full (Time, Place, and Person)  Thought Content: WDL and Logical   Suicidal Thoughts:  No  Homicidal Thoughts:  No  Memory:  Immediate;   Good Recent;   Good Remote;   Good  Judgement:  Good  Insight:  Good  Psychomotor Activity:  Normal  Concentration:  Concentration: Good and Attention Span: Good  Recall:  Good  Fund of Knowledge: Good  Language: Good  Akathisia:  No  Handed:  Right  AIMS (if indicated): not done  Assets:  Communication Skills Desire for Improvement Financial Resources/Insurance Housing Physical Health Social Support  ADL's:  Intact  Cognition: WNL  Sleep:  Good   Screenings: GAD-7    Flowsheet Row Video Visit from 02/07/2023 in [facility] Counselor from 10/03/2022 in [facility]  Total GAD-7 Score 0 0      PHQ2-9    Flowsheet Row Video Visit from 02/07/2023 in [facility] Most recent reading at 02/07/2023  8:38 AM Office Visit from 12/27/2022 in [place] at [facility] Most recent reading at 12/27/2022  3:25 PM Office Visit from 11/08/2022 in [place] at [facility] Most  recent reading at 11/08/2022  2:02 PM Counselor from 10/03/2022 in [facility] Most recent reading at 10/03/2022 10:25 AM Counselor from 10/03/2022 in [facility] Most recent reading at 10/03/2022  8:16 AM  PHQ-2 Total Score 0 0 0 0 0  PHQ-9 Total Score 0 -- -- 1 --      Flowsheet Row ED from 10/26/2022 in [facility] Emergency Department at [facility] Most recent reading at 10/26/2022  6:13 PM Counselor from 10/03/2022 in [facility] Most recent reading at 10/03/2022 10:25 AM Counselor from 10/03/2022 in [facility] Most recent reading at 10/03/2022  8:16 AM  C-SSRS RISK CATEGORY No Risk No Risk No Risk        Assessment and Plan: Patient notes that he is doing well on his current regimen.  No medication changes made today.  Patient agreeable to continue medication as prescribed  1. Recurrent major depressive disorder, in partial remission (HCC)  Continue- venlafaxine (EFFEXOR) 75 MG tablet; Take 1 tablet (75 mg total) by mouth 2 (two) times daily with a meal.  Dispense: 60 tablet; Refill: 3   Collaboration of Care: Collaboration of Care: Other provider involved in patient's care AEB PCP  Patient/Guardian was advised Release of Information must be obtained prior to any record release in order to collaborate their care with an outside provider. Patient/Guardian was advised if they have not already done so to contact the registration department to sign all necessary forms in order for [place] to release information regarding their care.   Consent: Patient/Guardian gives verbal consent for treatment and assignment of benefits for services provided during this visit. Patient/Guardian expressed understanding and agreed to proceed.   Follow-up in 3 months Salley Slaughter, NP 02/07/2023, 8:48 AM

## 2023-02-08 NOTE — Telephone Encounter (Unsigned)
Copied from [place] [phone]. Topic: General - Other >> Jan 18, 2023  1:09 PM Sabas Sous wrote: Reason for CRM: Message for Rutherford Nail, "The earliest [facility] can see me is not until November 2024. Can we find somewhere that can see me sooner"

## 2023-02-27 DIAGNOSIS — H4321 Crystalline deposits in vitreous body, right eye: Secondary | ICD-10-CM | POA: Diagnosis not present

## 2023-02-27 DIAGNOSIS — E119 Type 2 diabetes mellitus without complications: Secondary | ICD-10-CM | POA: Diagnosis not present

## 2023-02-27 DIAGNOSIS — H1045 Other chronic allergic conjunctivitis: Secondary | ICD-10-CM | POA: Diagnosis not present

## 2023-02-27 DIAGNOSIS — H25813 Combined forms of age-related cataract, bilateral: Secondary | ICD-10-CM | POA: Diagnosis not present

## 2023-02-27 DIAGNOSIS — H04123 Dry eye syndrome of bilateral lacrimal glands: Secondary | ICD-10-CM | POA: Diagnosis not present

## 2023-02-27 LAB — HM DIABETES EYE EXAM

## 2023-03-15 ENCOUNTER — Other Ambulatory Visit: Payer: Self-pay | Admitting: Family

## 2023-03-15 DIAGNOSIS — E1165 Type 2 diabetes mellitus with hyperglycemia: Secondary | ICD-10-CM

## 2023-03-16 ENCOUNTER — Encounter: Payer: Self-pay | Admitting: Family

## 2023-03-16 NOTE — Telephone Encounter (Signed)
Complete

## 2023-03-19 ENCOUNTER — Encounter: Payer: Self-pay | Admitting: "Endocrinology

## 2023-03-20 NOTE — Progress Notes (Signed)
Patient ID: Alan Reeves, male    DOB: 11/05/1956  MRN: [id number]  CC: Chronic Care Management   Subjective: Alan Reeves is a 67 y.o. male who presents for chronic care management.  His concerns today include:  - Doing well on blood pressure medications, no issues/concerns. He denies red flag symptoms such as but not limited to chest pain, shortness of breath, worst headache of life, nausea/vomiting.  - Doing well on cholesterol medication, no issues/concerns.  - Reports he has an upcoming appointment with Endocrinology for diabetes management.   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   Diabetes mellitus without complication 10/08/2017   Keratoconjunctivitis sicca of both eyes not specified as Sjogren's 10/08/2017   Long term current use of oral hypoglycemic drug 10/08/2017   Nuclear sclerotic cataract of both eyes 10/08/2017   Presbyopia of both eyes 10/08/2017   Vitreous floaters, bilateral 10/08/2017   Benign paroxysmal positional vertigo 11/15/2016   Impacted cerumen of left ear 11/15/2016   Needs flu shot 11/15/2016   Hyperlipemia 07/07/2016   Overweight (BMI 25.0-29.9) 07/07/2016   Essential hypertension 04/05/2016   Type 2 diabetes mellitus with hyperglycemia 04/05/2016   Shoulder impingement syndrome 09/24/2014     Current Outpatient Medications on File Prior to Visit  Medication Sig Dispense Refill   glipiZIDE (GLUCOTROL) 5 MG tablet Take 1 tablet (5 mg total) by mouth 2 (two) times daily before a meal. 60 tablet 2   glucose blood (TRUE METRIX BLOOD GLUCOSE TEST) test strip Use as instructed 100 each 12   Insulin Glargine (BASAGLAR KWIKPEN) 100 UNIT/ML Inject 20 Units into the skin daily. 15 mL 1   Insulin Pen Needle (RELION PEN NEEDLE 31G/8MM) 31G X 8 MM MISC USE AS DIRECTED 100 each 0    metFORMIN (GLUCOPHAGE) 1000 MG tablet Take 1 tablet (1,000 mg total) by mouth 2 (two) times daily. 60 tablet 2   pioglitazone (ACTOS) 45 MG tablet Take 1 tablet (45 mg total) by mouth daily. 30 tablet 2   triamcinolone ointment (KENALOG) 0.5 % Apply 1 application. topically 2 (two) times daily. 60 g 1   venlafaxine (EFFEXOR) 75 MG tablet Take 1 tablet (75 mg total) by mouth 2 (two) times daily with a meal. 60 tablet 3   No current facility-administered medications on file prior to visit.    No Known Allergies  Social History   Socioeconomic History   Marital status: Divorced    Spouse name: Not on file   Number of children: Not on file   Years of education: Not on file   Highest education level: Not on file  Occupational History   Not on file  Tobacco Use   Smoking status: Never    Passive exposure: Never   Smokeless tobacco: Never  Vaping Use   Vaping Use: Never used  Substance and Sexual Activity   Alcohol use: No   Drug use: No   Sexual activity: Not on file  Other Topics Concern   Not on file  Social History Narrative   Not on file   Social Determinants of Health   Financial Resource Strain: Low Risk  (10/03/2022)   Overall Financial Resource Strain (CARDIA)    Difficulty of Paying Living Expenses: Not hard at all  Food Insecurity: No Food Insecurity (10/03/2022)   Hunger Vital Sign  Worried About Programme researcher, broadcasting/film/video in the Last Year: Never true    Ran Out of Food in the Last Year: Never true  Transportation Needs: No Transportation Needs (10/03/2022)   PRAPARE - Administrator, Civil Service (Medical): No    Lack of Transportation (Non-Medical): No  Physical Activity: Inactive (10/03/2022)   Exercise Vital Sign    Days of Exercise per Week: 0 days    Minutes of Exercise per Session: 0 min  Stress: No Stress Concern Present (10/03/2022)   [employer] of Occupational Health - Occupational Stress Questionnaire    Feeling of Stress : Not at  all  Social Connections: Socially Isolated (10/03/2022)   Social Connection and Isolation Panel [NHANES]    Frequency of Communication with Friends and Family: More than three times a week    Frequency of Social Gatherings with Friends and Family: Once a week    Attends Religious Services: Never    Database administrator or Organizations: No    Attends Banker Meetings: Never    Marital Status: Divorced  Catering manager Violence: Not on file    Family History  Problem Relation Age of Onset   Hypertension Mother    Diabetes Father    Cancer Father     No past surgical history on file.  ROS: Review of Systems Negative except as stated above  PHYSICAL EXAM: BP 103/71   Pulse 67   Temp 98.6 F (37 C)   Resp 14   Ht 5\' 11"  (1.803 m)   Wt 216 lb 12.8 oz (98.3 kg)   SpO2 98%   BMI 30.24 kg/m   Physical Exam HENT:     Head: Normocephalic and atraumatic.  Eyes:     Extraocular Movements: Extraocular movements intact.     Conjunctiva/sclera: Conjunctivae normal.     Pupils: Pupils are equal, round, and reactive to light.  Cardiovascular:     Rate and Rhythm: Normal rate and regular rhythm.     Pulses: Normal pulses.     Heart sounds: Normal heart sounds.  Pulmonary:     Effort: Pulmonary effort is normal.     Breath sounds: Normal breath sounds.  Musculoskeletal:     Cervical back: Normal range of motion and neck supple.  Neurological:     General: No focal deficit present.     Mental Status: He is alert and oriented to person, place, and time.  Psychiatric:        Mood and Affect: Mood normal.        Behavior: Behavior normal.     ASSESSMENT AND PLAN: 1. Primary hypertension - Continue Amlodipine and Lisinopril as prescribed.  - Counseled on blood pressure goal of less than 140/90, low-sodium, DASH diet, medication compliance, 150 minutes of moderate intensity exercise per week as tolerated. Discussed medication compliance, adverse effects. -  Follow-up with primary provider in 6 months or sooner if needed.  - amLODipine (NORVASC) 5 MG tablet; Take 1 tablet (5 mg total) by mouth daily.  Dispense: 90 tablet; Refill: 0 - lisinopril (ZESTRIL) 5 MG tablet; Take 1 tablet (5 mg total) by mouth daily.  Dispense: 90 tablet; Refill: 0  2. Hyperlipidemia, unspecified hyperlipidemia type - Continue Rosuvastatin as prescribed.  - Routine screening.  - Follow-up with primary provider as scheduled.  - Lipid panel - rosuvastatin (CRESTOR) 20 MG tablet; Take 1 tablet (20 mg total) by mouth daily.  Dispense: 90 tablet; Refill: 0   Patient  was given the opportunity to ask questions.  Patient verbalized understanding of the plan and was able to repeat key elements of the plan. Patient was given clear instructions to go to Emergency Department or return to medical center if symptoms don't improve, worsen, or new problems develop.The patient verbalized understanding.   Orders Placed This Encounter  Procedures   Lipid panel    Requested Prescriptions   Signed Prescriptions Disp Refills   amLODipine (NORVASC) 5 MG tablet 90 tablet 0    Sig: Take 1 tablet (5 mg total) by mouth daily.   lisinopril (ZESTRIL) 5 MG tablet 90 tablet 0    Sig: Take 1 tablet (5 mg total) by mouth daily.   rosuvastatin (CRESTOR) 20 MG tablet 90 tablet 0    Sig: Take 1 tablet (20 mg total) by mouth daily.    Return in about 6 months (around 09/27/2023) for Follow-Up or next available chronic care mgmt .  Rema FendtAmy J Akiba Melfi, NP

## 2023-03-28 ENCOUNTER — Ambulatory Visit (INDEPENDENT_AMBULATORY_CARE_PROVIDER_SITE_OTHER): Payer: Medicare Other | Admitting: Family

## 2023-03-28 ENCOUNTER — Encounter: Payer: Self-pay | Admitting: Family

## 2023-03-28 VITALS — BP 103/71 | HR 67 | Temp 98.6°F | Resp 14 | Ht 71.0 in | Wt 216.8 lb

## 2023-03-28 DIAGNOSIS — E785 Hyperlipidemia, unspecified: Secondary | ICD-10-CM

## 2023-03-28 DIAGNOSIS — I10 Essential (primary) hypertension: Secondary | ICD-10-CM | POA: Diagnosis not present

## 2023-03-28 MED ORDER — LISINOPRIL 5 MG PO TABS: 5.0000 mg | ORAL_TABLET | Freq: Every day | ORAL | 0 refills | Status: DC

## 2023-03-28 MED ORDER — AMLODIPINE BESYLATE 5 MG PO TABS: 5.0000 mg | ORAL_TABLET | Freq: Every day | ORAL | 0 refills | Status: DC

## 2023-03-28 MED ORDER — ROSUVASTATIN CALCIUM 20 MG PO TABS: 20.0000 mg | ORAL_TABLET | Freq: Every day | ORAL | 0 refills | Status: DC

## 2023-03-28 NOTE — Progress Notes (Signed)
Pt is here for chronic care mgt    

## 2023-04-12 ENCOUNTER — Telehealth: Payer: Self-pay | Admitting: *Deleted

## 2023-04-12 NOTE — Telephone Encounter (Signed)
Call return to Stormy at [facility] with requested information.  Copied from CRM [phone]. Topic: General - Other >> Apr 12, 2023  1:40 PM Dominique A wrote: Reason for CRM: Lupita Raider licensed agent with [employer] [employer] states that she is enrolling pt in [place] medicare advantage plan and is needing to know the name of the insulin pt is taking. Please call Stormy back at [phone]. Stormy states if she is on another call please call pt back and let him know the name of the insulin so that he can let her know.

## 2023-04-20 ENCOUNTER — Telehealth (INDEPENDENT_AMBULATORY_CARE_PROVIDER_SITE_OTHER): Payer: No Payment, Other | Admitting: Psychiatry

## 2023-04-20 ENCOUNTER — Encounter (HOSPITAL_COMMUNITY): Payer: Self-pay | Admitting: Psychiatry

## 2023-04-20 DIAGNOSIS — F3341 Major depressive disorder, recurrent, in partial remission: Secondary | ICD-10-CM | POA: Diagnosis not present

## 2023-04-20 MED ORDER — VENLAFAXINE HCL 75 MG PO TABS: 75.0000 mg | ORAL_TABLET | Freq: Two times a day (BID) | ORAL | 2 refills | Status: DC

## 2023-04-20 NOTE — Progress Notes (Signed)
BH MD/PA/NP OP Progress Note Virtual Visit via Video Note  I connected with Alan Reeves on 04/20/23 at  8:30 AM EDT by a video enabled telemedicine application and verified that I am speaking with the correct person using two identifiers.  Location: Patient: Home Provider: Clinic   I discussed the limitations of evaluation and management by telemedicine and the availability of in person appointments. The patient expressed understanding and agreed to proceed.  I provided 30 minutes of non-face-to-face time during this encounter.   04/20/2023 8:50 AM Alan Reeves  MRN:  [id number]  Chief Complaint: " I am doing fine"  HPI:  67 year old male seen today for follow-up psychiatric evaluation. He has a psychiatric history of bipolar disorder, anxiety, and depression.  He is currently managed on Effexor 75 mg twice daily and reports that it is effective in managing his psychiatric conditions.  Today he is well-groomed, pleasant, cooperative, engaged in conversation, maintained eye contact.  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  He notes that his mood is stable and reports that he has minimal anxiety and depression.  Today provider conducted a GAD-7 and patient scored a 0, at his last visit he scored a 0.  Provider also conducted PHQ-9 and patient scored a 0, at his last visit he scored a 1.  He endorses adequate sleep and appetite.  Today he denies SI/HI/VAH, mania, paranoia.  No medication changes made today.  Patient agreeable to continue medication as prescribed.  No other concerns at this time. Visit Diagnosis:    ICD-10-CM   1. Recurrent major depressive disorder, in partial remission (HCC)  F33.41 venlafaxine (EFFEXOR) 75 MG tablet      Past Psychiatric History: bipolar disorder, anxiety, and depression  Past Medical History:  Past Medical History:  Diagnosis Date   Anxiety    Depression    Diabetes mellitus without complication (HCC)    Hyperlipidemia    Hypertension     History reviewed. No pertinent surgical history.  Family Psychiatric History:  Maternal uncles alcohol use, Sister bipolar disorder, paternal aunts alcohol use   Family History:  Family History  Problem Relation Age of Onset   Hypertension Mother    Diabetes Father    Cancer Father     Social History:  Social History   Socioeconomic History   Marital status: Divorced    Spouse name: Not on file   Number of children: Not on file   Years of education: Not on file   Highest education level: Not on file  Occupational History   Not on file  Tobacco Use   Smoking status: Never    Passive exposure: Never   Smokeless tobacco: Never  Vaping Use   Vaping Use: Never used  Substance and Sexual Activity   Alcohol use: No   Drug use: No   Sexual activity: Not on file  Other Topics Concern   Not on file  Social History Narrative   Not on file   Social Determinants of Health   Financial Resource Strain: Low Risk  (10/03/2022)   Overall Financial Resource Strain (CARDIA)    Difficulty of Paying Living Expenses: Not hard at all  Food Insecurity: No Food Insecurity (10/03/2022)   Hunger Vital Sign    Worried About Running Out of Food in the Last Year: Never true    Ran Out of Food in the Last Year: Never true  Transportation Needs: No Transportation Needs (10/03/2022)   PRAPARE - Transportation  Lack of Transportation (Medical): No    Lack of Transportation (Non-Medical): No  Physical Activity: Inactive (10/03/2022)   Exercise Vital Sign    Days of Exercise per Week: 0 days    Minutes of Exercise per Session: 0 min  Stress: No Stress Concern Present (10/03/2022)   [employer] of Occupational Health - Occupational Stress Questionnaire    Feeling of Stress : Not at all  Social Connections: Socially Isolated (10/03/2022)   Social Connection and Isolation Panel [NHANES]    Frequency of Communication with Friends and Family: More than three times a week    Frequency of  Social Gatherings with Friends and Family: Once a week    Attends Religious Services: Never    Database administrator or Organizations: No    Attends Engineer, structural: Never    Marital Status: Divorced    Allergies: No Known Allergies  Metabolic Disorder Labs: Lab Results  Component Value Date   HGBA1C 8.7 (A) 12/27/2022   No results found for: "PROLACTIN" Lab Results  Component Value Date   CHOL 87 (L) 11/23/2021   TRIG 230 (H) 11/23/2021   HDL 32 (L) 11/23/2021   CHOLHDL 2.7 11/23/2021   VLDL 73 (H) 03/31/2014   LDLCALC 20 11/23/2021   LDLCALC 100 (H) 02/18/2021   Lab Results  Component Value Date   TSH 0.831 02/18/2021   TSH 0.613 03/31/2014    Therapeutic Level Labs: Lab Results  Component Value Date   LITHIUM 0.5 10/26/2021   LITHIUM 0.6 06/27/2021   No results found for: "VALPROATE" No results found for: "CBMZ"  Current Medications: Current Outpatient Medications  Medication Sig Dispense Refill   amLODipine (NORVASC) 5 MG tablet Take 1 tablet (5 mg total) by mouth daily. 90 tablet 0   glipiZIDE (GLUCOTROL) 5 MG tablet Take 1 tablet (5 mg total) by mouth 2 (two) times daily before a meal. 60 tablet 2   glucose blood (TRUE METRIX BLOOD GLUCOSE TEST) test strip Use as instructed 100 each 12   Insulin Glargine (BASAGLAR KWIKPEN) 100 UNIT/ML Inject 20 Units into the skin daily. 15 mL 1   Insulin Pen Needle (RELION PEN NEEDLE 31G/8MM) 31G X 8 MM MISC USE AS DIRECTED 100 each 0   lisinopril (ZESTRIL) 5 MG tablet Take 1 tablet (5 mg total) by mouth daily. 90 tablet 0   metFORMIN (GLUCOPHAGE) 1000 MG tablet Take 1 tablet (1,000 mg total) by mouth 2 (two) times daily. 60 tablet 2   pioglitazone (ACTOS) 45 MG tablet Take 1 tablet (45 mg total) by mouth daily. 30 tablet 2   rosuvastatin (CRESTOR) 20 MG tablet Take 1 tablet (20 mg total) by mouth daily. 90 tablet 0   triamcinolone ointment (KENALOG) 0.5 % Apply 1 application. topically 2 (two) times daily. 60  g 1   venlafaxine (EFFEXOR) 75 MG tablet Take 1 tablet (75 mg total) by mouth 2 (two) times daily with a meal. 120 tablet 2   No current facility-administered medications for this visit.     Musculoskeletal: Strength & Muscle Tone: within normal limits and Telehealth visit Gait & Station: normal, Telehealth visit Patient leans: N/A  Psychiatric Specialty Exam: Review of Systems  There were no vitals taken for this visit.There is no height or weight on file to calculate BMI.  General Appearance: Well Groomed  Eye Contact:  Good  Speech:  Clear and Coherent and Normal Rate  Volume:  Normal  Mood:  Euthymic  Affect:  Appropriate and  Congruent  Thought Process:  Coherent, Goal Directed, and NA  Orientation:  Full (Time, Place, and Person)  Thought Content: WDL and Logical   Suicidal Thoughts:  No  Homicidal Thoughts:  No  Memory:  Immediate;   Good Recent;   Good Remote;   Good  Judgement:  Good  Insight:  Good  Psychomotor Activity:  Normal  Concentration:  Concentration: Good and Attention Span: Good  Recall:  Good  Fund of Knowledge: Good  Language: Good  Akathisia:  No  Handed:  Right  AIMS (if indicated): not done  Assets:  Communication Skills Desire for Improvement Financial Resources/Insurance Housing Physical Health Social Support  ADL's:  Intact  Cognition: WNL  Sleep:  Good   Screenings: GAD-7    Flowsheet Row Video Visit from 04/20/2023 in [facility] Video Visit from 02/07/2023 in [facility] Counselor from 10/03/2022 in [facility]  Total GAD-7 Score 0 0 0      PHQ2-9    Flowsheet Row Video Visit from 04/20/2023 in [facility] Office Visit from 03/28/2023 in [facility] at [facility] Video Visit from 02/07/2023 in [facility] Office Visit from 12/27/2022 in [facility] Primary Care at [facility] Office Visit from 11/08/2022 in [facility] at [facility]  PHQ-2 Total Score 0 0 0 0 0  PHQ-9 Total Score 1 0 0 -- --      Flowsheet Row ED from 10/26/2022 in [facility] Emergency Department at [facility] Most recent reading at 10/26/2022  6:13 PM Counselor from 10/03/2022 in [facility] Most recent reading at 10/03/2022 10:25 AM Counselor from 10/03/2022 in [facility] Most recent reading at 10/03/2022  8:16 AM  C-SSRS RISK CATEGORY No Risk No Risk No Risk        Assessment and Plan: Patient notes that he is doing well on his current regimen.  No medication changes made today.  Patient agreeable to continue medication as prescribed  1. Recurrent major depressive disorder, in partial remission (HCC)  Continue- venlafaxine (EFFEXOR) 75 MG tablet; Take 1 tablet (75 mg total) by mouth 2 (two) times daily with a meal.  Dispense: 120 tablet; Refill: 2    Collaboration of Care: Collaboration of Care: Other provider involved in patient's care AEB PCP  Patient/Guardian was advised Release of Information must be obtained prior to any record release in order to collaborate their care with an outside provider. Patient/Guardian was advised if they have not already done so to contact the registration department to sign all necessary forms in order for [place] to release information regarding their care.   Consent: Patient/Guardian gives verbal consent for treatment and assignment of benefits for services provided during this visit. Patient/Guardian expressed understanding and agreed to proceed.   Follow-up in 3 months Shanna Cisco, NP 04/20/2023, 8:50 AM

## 2023-04-23 ENCOUNTER — Telehealth: Payer: Self-pay | Admitting: Family

## 2023-04-23 NOTE — Telephone Encounter (Signed)
Pt reports that his insurance has changed and he can not afford to pay $119 for the 2 pens of the Insulin Glargine (BASAGLAR KWIKPEN) 100 UNIT/ML. Pt requests that a new Rx for insulin that is less expensive. Cb# [id number]

## 2023-04-25 ENCOUNTER — Other Ambulatory Visit: Payer: Self-pay | Admitting: Family

## 2023-04-25 DIAGNOSIS — E1165 Type 2 diabetes mellitus with hyperglycemia: Secondary | ICD-10-CM

## 2023-04-25 MED ORDER — INSULIN DEGLUDEC 100 UNIT/ML ~~LOC~~ SOPN: 20.0000 [IU] | PEN_INJECTOR | Freq: Every day | SUBCUTANEOUS | 0 refills | Status: DC

## 2023-04-25 NOTE — Telephone Encounter (Signed)
Patient notified Rx at [facility] and appreciative of sending in Rx.

## 2023-04-25 NOTE — Telephone Encounter (Signed)
Call patient with update. Tresiba prescribed. Please make patient aware to keep upcoming appointment with Endocrinology on 05/02/2023.

## 2023-05-02 ENCOUNTER — Encounter: Payer: Self-pay | Admitting: "Endocrinology

## 2023-05-02 ENCOUNTER — Ambulatory Visit (INDEPENDENT_AMBULATORY_CARE_PROVIDER_SITE_OTHER): Payer: Medicare HMO | Admitting: "Endocrinology

## 2023-05-02 VITALS — BP 112/68 | HR 64 | Ht 71.0 in | Wt 219.8 lb

## 2023-05-02 DIAGNOSIS — E1165 Type 2 diabetes mellitus with hyperglycemia: Secondary | ICD-10-CM | POA: Diagnosis not present

## 2023-05-02 DIAGNOSIS — Z794 Long term (current) use of insulin: Secondary | ICD-10-CM | POA: Diagnosis not present

## 2023-05-02 DIAGNOSIS — I10 Essential (primary) hypertension: Secondary | ICD-10-CM

## 2023-05-02 DIAGNOSIS — E782 Mixed hyperlipidemia: Secondary | ICD-10-CM | POA: Diagnosis not present

## 2023-05-02 LAB — POCT GLYCOSYLATED HEMOGLOBIN (HGB A1C): HbA1c, POC (controlled diabetic range): 8.5 % — AB (ref 0.0–7.0)

## 2023-05-02 MED ORDER — INSULIN DEGLUDEC 100 UNIT/ML ~~LOC~~ SOPN: 20.0000 [IU] | PEN_INJECTOR | Freq: Every day | SUBCUTANEOUS | 1 refills | Status: DC

## 2023-05-02 MED ORDER — GLIPIZIDE 5 MG PO TABS: 5.0000 mg | ORAL_TABLET | Freq: Every day | ORAL | 0 refills | Status: DC

## 2023-05-02 MED ORDER — METFORMIN HCL 1000 MG PO TABS: 1000.0000 mg | ORAL_TABLET | Freq: Two times a day (BID) | ORAL | 0 refills | Status: DC

## 2023-05-02 NOTE — Progress Notes (Signed)
Endocrinology Consult Note       05/02/2023, 12:51 PM   Subjective:    Patient ID: Alan Reeves, male    DOB: 03/17/1956.  CAYLIN BAKEMAN is being seen in consultation for management of currently uncontrolled symptomatic diabetes requested by  Rema Fendt, NP.   Past Medical History:  Diagnosis Date   Anxiety    Bipolar 1 disorder (HCC)    Depression    Diabetes mellitus without complication (HCC)    Hyperlipidemia    Hypertension    Kidney disease     Past Surgical History:  Procedure Laterality Date   COLONOSCOPY      Social History   Socioeconomic History   Marital status: Divorced    Spouse name: Not on file   Number of children: Not on file   Years of education: Not on file   Highest education level: Not on file  Occupational History   Not on file  Tobacco Use   Smoking status: Never    Passive exposure: Never   Smokeless tobacco: Never  Vaping Use   Vaping Use: Never used  Substance and Sexual Activity   Alcohol use: No   Drug use: No   Sexual activity: Not on file  Other Topics Concern   Not on file  Social History Narrative   Not on file   Social Determinants of Health   Financial Resource Strain: Low Risk  (10/03/2022)   Overall Financial Resource Strain (CARDIA)    Difficulty of Paying Living Expenses: Not hard at all  Food Insecurity: No Food Insecurity (10/03/2022)   Hunger Vital Sign    Worried About Running Out of Food in the Last Year: Never true    Ran Out of Food in the Last Year: Never true  Transportation Needs: No Transportation Needs (10/03/2022)   PRAPARE - Administrator, Civil Service (Medical): No    Lack of Transportation (Non-Medical): No  Physical Activity: Inactive (10/03/2022)   Exercise Vital Sign    Days of Exercise per Week: 0 days    Minutes of Exercise per Session: 0 min  Stress: No Stress Concern Present  (10/03/2022)   [employer] of Occupational Health - Occupational Stress Questionnaire    Feeling of Stress : Not at all  Social Connections: Socially Isolated (10/03/2022)   Social Connection and Isolation Panel [NHANES]    Frequency of Communication with Friends and Family: More than three times a week    Frequency of Social Gatherings with Friends and Family: Once a week    Attends Religious Services: Never    Database administrator or Organizations: No    Attends Banker Meetings: Never    Marital Status: Divorced    Family History  Problem Relation Age of Onset   Cancer Mother    Diabetes Mother    Hypertension Mother    Hyperlipidemia Mother    Diabetes Father    Cancer Father    Stroke Father     Outpatient Encounter Medications as of 05/02/2023  Medication Sig   amLODipine (NORVASC) 5 MG tablet Take 1 tablet (  5 mg total) by mouth daily.   glipiZIDE (GLUCOTROL) 5 MG tablet Take 1 tablet (5 mg total) by mouth daily with breakfast.   glucose blood (TRUE METRIX BLOOD GLUCOSE TEST) test strip Use as instructed   insulin degludec (TRESIBA) 100 UNIT/ML FlexTouch Pen Inject 20 Units into the skin at bedtime.   Insulin Pen Needle (RELION PEN NEEDLE 31G/8MM) 31G X 8 MM MISC USE AS DIRECTED   lisinopril (ZESTRIL) 5 MG tablet Take 1 tablet (5 mg total) by mouth daily.   metFORMIN (GLUCOPHAGE) 1000 MG tablet Take 1 tablet (1,000 mg total) by mouth 2 (two) times daily with a meal.   rosuvastatin (CRESTOR) 20 MG tablet Take 1 tablet (20 mg total) by mouth daily.   triamcinolone ointment (KENALOG) 0.5 % Apply 1 application. topically 2 (two) times daily.   venlafaxine (EFFEXOR) 75 MG tablet Take 1 tablet (75 mg total) by mouth 2 (two) times daily with a meal.   [DISCONTINUED] glipiZIDE (GLUCOTROL) 5 MG tablet Take 1 tablet (5 mg total) by mouth 2 (two) times daily before a meal.   [DISCONTINUED] insulin degludec (TRESIBA) 100 UNIT/ML FlexTouch Pen Inject 20 Units into  the skin daily. (Patient taking differently: Inject 20 Units into the skin at bedtime.)   [DISCONTINUED] metFORMIN (GLUCOPHAGE) 1000 MG tablet Take 1 tablet (1,000 mg total) by mouth 2 (two) times daily.   [DISCONTINUED] pioglitazone (ACTOS) 45 MG tablet Take 1 tablet (45 mg total) by mouth daily.   No facility-administered encounter medications on file as of 05/02/2023.    ALLERGIES: No Known Allergies  VACCINATION STATUS: Immunization History  Administered Date(s) Administered   Fluad Quad(high Dose 66+) 09/29/2022   Influenza Split 11/26/2014, 10/12/2015, 09/26/2019   Influenza,inj,Quad PF,6-35 Mos 11/15/2016, 11/14/2017, 12/19/2018   Influenza-Unspecified 09/26/2019, 12/25/2020, 09/23/2021   PFIZER Comirnaty(Gray Top)Covid-19 Tri-Sucrose Vaccine 03/18/2021   PFIZER(Purple Top)SARS-COV-2 Vaccination 04/10/2020, 09/23/2021   Pneumococcal Conjugate-13 10/12/2015   Pneumococcal Polysaccharide-23 07/01/2013   Tdap 12/13/2007, 05/24/2021   Zoster Recombinat (Shingrix) 03/18/2021    Diabetes He presents for his initial diabetic visit. He has type 2 diabetes mellitus. Onset time: He was diagnosed at approximate age of 51 years. Pertinent negatives for hypoglycemia include no confusion, headaches, pallor or seizures. Associated symptoms include polydipsia and polyuria. Pertinent negatives for diabetes include no chest pain, no fatigue, no polyphagia and no weakness. There are no hypoglycemic complications. Symptoms are worsening. There are no diabetic complications. Risk factors for coronary artery disease include diabetes mellitus, dyslipidemia, male sex and hypertension. Current diabetic treatment includes insulin injections. His weight is fluctuating minimally. He is following a generally unhealthy diet. When asked about meal planning, he reported none. He has not had a previous visit with a dietitian. He rarely participates in exercise. His home blood glucose trend is increasing steadily. His  overall blood glucose range is >200 mg/dl. (He did not bring any logs nor meter with him.  His point-of-care A1c is 8.5% consistent with uncontrolled diabetes.) An ACE inhibitor/angiotensin II receptor blocker is being taken. Eye exam is current.  Hyperlipidemia This is a chronic problem. The current episode started more than 1 year ago. Pertinent negatives include no chest pain, myalgias or shortness of breath. Current antihyperlipidemic treatment includes statins. Risk factors for coronary artery disease include dyslipidemia, diabetes mellitus, hypertension and male sex.     Review of Systems  Constitutional:  Negative for chills, fatigue, fever and unexpected weight change.  HENT:  Negative for dental problem, mouth sores and trouble swallowing.  Eyes:  Negative for visual disturbance.  Respiratory:  Negative for cough, choking, chest tightness, shortness of breath and wheezing.   Cardiovascular:  Negative for chest pain, palpitations and leg swelling.  Gastrointestinal:  Negative for abdominal distention, abdominal pain, constipation, diarrhea, nausea and vomiting.  Endocrine: Positive for polydipsia and polyuria. Negative for polyphagia.  Genitourinary:  Negative for dysuria, flank pain, hematuria and urgency.  Musculoskeletal:  Negative for back pain, gait problem, myalgias and neck pain.  Skin:  Negative for pallor, rash and wound.  Neurological:  Negative for seizures, syncope, weakness, numbness and headaches.  Psychiatric/Behavioral:  Negative for confusion and dysphoric mood.     Objective:       05/02/2023   10:08 AM 03/28/2023    3:39 PM 03/28/2023    3:26 PM  Vitals with BMI  Height 5\' 11"   5\' 11"   Weight 219 lbs 13 oz  216 lbs 13 oz  BMI 30.67  30.25  Systolic 112 103 161  Diastolic 68 71 87  Pulse 64  67    BP 112/68   Pulse 64   Ht 5\' 11"  (1.803 m)   Wt 219 lb 12.8 oz (99.7 kg)   BMI 30.66 kg/m   Wt Readings from Last 3 Encounters:  05/02/23 219 lb 12.8 oz  (99.7 kg)  03/28/23 216 lb 12.8 oz (98.3 kg)  12/27/22 215 lb (97.5 kg)     Physical Exam Constitutional:      General: He is not in acute distress.    Appearance: He is well-developed.  HENT:     Head: Normocephalic and atraumatic.  Neck:     Thyroid: No thyromegaly.     Trachea: No tracheal deviation.  Cardiovascular:     Rate and Rhythm: Normal rate.     Pulses:          Dorsalis pedis pulses are 1+ on the right side and 1+ on the left side.       Posterior tibial pulses are 1+ on the right side and 1+ on the left side.     Heart sounds: Normal heart sounds, S1 normal and S2 normal. No murmur heard.    No gallop.  Pulmonary:     Effort: No respiratory distress.     Breath sounds: Normal breath sounds. No wheezing.  Abdominal:     General: Bowel sounds are normal. There is no distension.     Palpations: Abdomen is soft.     Tenderness: There is no abdominal tenderness. There is no guarding.  Musculoskeletal:     Right shoulder: No swelling or deformity.     Cervical back: Normal range of motion and neck supple.  Skin:    General: Skin is warm and dry.     Findings: No rash.     Nails: There is no clubbing.  Neurological:     Mental Status: He is alert and oriented to person, place, and time.     Cranial Nerves: No cranial nerve deficit.     Sensory: No sensory deficit.     Gait: Gait normal.     Deep Tendon Reflexes: Reflexes are normal and symmetric.  Psychiatric:        Speech: Speech normal.        Behavior: Behavior normal. Behavior is cooperative.        Thought Content: Thought content normal.        Judgment: Judgment normal.       CMP ( most recent) CMP  Component Value Date/Time   NA 139 09/29/2022 1647   K 4.8 09/29/2022 1647   CL 103 09/29/2022 1647   CO2 20 09/29/2022 1647   GLUCOSE 217 (H) 09/29/2022 1647   GLUCOSE 357 (H) 03/31/2014 1609   BUN 14 09/29/2022 1647   CREATININE 1.23 09/29/2022 1647   CREATININE 1.12 03/31/2014 1609    CALCIUM 9.1 09/29/2022 1647   PROT 7.1 02/18/2021 1529   ALBUMIN 4.6 02/27/2022 0000   ALBUMIN 4.4 02/18/2021 1529   AST 25 02/18/2021 1529   ALT 34 02/18/2021 1529   ALKPHOS 101 02/18/2021 1529   BILITOT 0.4 02/18/2021 1529     Diabetic Labs (most recent): Lab Results  Component Value Date   HGBA1C 8.5 (A) 05/02/2023   HGBA1C 8.7 (A) 12/27/2022   HGBA1C 8.4 (A) 09/29/2022     Lipid Panel ( most recent) Lipid Panel     Component Value Date/Time   CHOL 87 (L) 11/23/2021 1507   TRIG 230 (H) 11/23/2021 1507   HDL 32 (L) 11/23/2021 1507   CHOLHDL 2.7 11/23/2021 1507   CHOLHDL 4.8 03/31/2014 1609   VLDL 73 (H) 03/31/2014 1609   LDLCALC 20 11/23/2021 1507   LABVLDL 35 11/23/2021 1507      Lab Results  Component Value Date   TSH 0.831 02/18/2021   TSH 0.613 03/31/2014   FREET4 1.38 02/18/2021      Assessment & Plan:   1. Type 2 diabetes mellitus with hyperglycemia, with long-term current use of insulin (HCC)  - JYRAN YACK has currently uncontrolled symptomatic type 2 DM since  67 years of age,  with most recent A1c of 8.5 %. Recent labs reviewed. - I had a long discussion with him about the possible risk factors and  the pathology behind its diabetes and its complications. -his diabetes is complicated by CKD, comorbid hyperlipidemia, high BMI, sedentary life and he remains at a high risk for more acute and chronic complications which include CAD, CVA, CKD, retinopathy, and neuropathy. These are all discussed in detail with him.  - I discussed all available options of managing his diabetes including de-escalation of medications. I have counseled him on Food as Medicine by adopting a Whole Food , Plant Predominant  ( WFPP) nutrition as recommended by [employer] of Lifestyle Medicine. Patient is encouraged to switch to  unprocessed or minimally processed  complex starch, adequate protein intake (mainly plant source), minimal liquid fat, plenty of fruits, and  vegetables. -  he is advised to stick to a routine mealtimes to eat 3 complete meals a day and snack only when necessary ( to snack only to correct hypoglycemia BG <70 day time or <100 at night).   - he acknowledges that there is a room for improvement in his food and drink choices. - Further Specific Suggestion is made for him to avoid simple carbohydrates  from his diet including Cakes, Sweet Desserts, Ice Cream, Soda (diet and regular), Sweet Tea, Candies, Chips, Cookies, Store Bought Juices, Alcohol ,  Artificial Sweeteners,  Coffee Creamer, and "Sugar-free" Products. This will help patient to have more stable blood glucose profile and potentially avoid unintended weight gain.  The following Lifestyle Medicine recommendations according to American College of Lifestyle Medicine [facility]) were discussed and offered to patient and he agrees to start the journey:  A.  Whole Foods, Plant-based plate comprising of fruits and vegetables, plant-based proteins, whole-grain carbohydrates was discussed in detail with the patient.   A list for source of  those nutrients were also provided to the patient.  Patient will use only water or unsweetened tea for hydration. B.  The need to stay away from risky substances including alcohol, smoking; obtaining 7 to 9 hours of restorative sleep, at least 150 minutes of moderate intensity exercise weekly, the importance of healthy social connections,  and stress reduction techniques were discussed. C.  A full color page of  Calorie density of various food groups per pound showing examples of each food groups was provided to the patient.  - he will be scheduled with Norm Salt, RDN, CDE for individualized diabetes education.  - I have approached him with the following individualized plan to manage  his diabetes and patient agrees:   -In light of his chronic hyperglycemic burden, he will need multiple medications in order for him to achieve control of diabetes to target.   Accordingly, he is advised to continue Tresiba 20 units nightly, advised to continue metformin 1000 mg p.o. twice daily, advised to finish his current supply of Actos 45 mg p.o. daily at breakfast and will stop this medication.  He is advised to lower glipizide to 5 mg only at breakfast.  He is willing to monitor blood glucose twice daily-before breakfast and at bedtime until his return in 2 weeks for reevaluation.  This patient will be considered for a CGM on his subsequent visits.  -Depending on his engagement, he has a chance to de-escalate on his medications further, he will be considered for incretin therapy as appropriate next visit.  - Specific targets for  A1c;  LDL, HDL,  and Triglycerides were discussed with the patient.  2) Blood Pressure /Hypertension:  his blood pressure is  controlled to target.   he is advised to continue his current medications including amlodipine 5 mg p.o. daily at breakfast, lisinopril 5 mg p.o. daily at breakfast.   3) Lipids/Hyperlipidemia:   Review of his recent lipid panel showed  controlled  LDL at 20 .  he  is advised to continue    Crestor 20 mg daily at bedtime.  Side effects and precautions discussed with him.  4)  Weight/Diet:  Body mass index is 30.66 kg/m.  -   clearly complicating his diabetes care.   he is  a candidate for weight loss. I discussed with him the fact that loss of 5 - 10% of his  current body weight will have the most impact on his diabetes management.  The above detailed  ACLM recommendations for nutrition, exercise, sleep, social life, avoidance of risky substances, the need for restorative sleep   information will also detailed on discharge instructions.  5) Chronic Care/Health Maintenance:  -he  is on ACEI/ARB and Statin medications and  is encouraged to initiate and continue to follow up with Ophthalmology, Dentist,  Podiatrist at least yearly or according to recommendations, and advised to   stay away from smoking. I have  recommended yearly flu vaccine and pneumonia vaccine at least every 5 years; moderate intensity exercise for up to 150 minutes weekly; and  sleep for 7- 9 hours a day.  - he is  advised to maintain close follow up with Rema Fendt, NP for primary care needs, as well as his other providers for optimal and coordinated care.   I spent 62 minutes in the care of the patient today including review of labs from CMP, Lipids, Thyroid Function, Hematology (current and previous including abstractions from other facilities); face-to-face time discussing  his blood glucose readings/logs,  discussing hypoglycemia and hyperglycemia episodes and symptoms, medications doses, his options of short and long term treatment based on the latest standards of care / guidelines;  discussion about incorporating lifestyle medicine;  and documenting the encounter. Risk reduction counseling performed per USPSTF guidelines to reduce  obesity and cardiovascular risk factors.      Please refer to Patient Instructions for Blood Glucose Monitoring and Insulin/Medications Dosing Guide"  in media tab for additional information. Please  also refer to " Patient Self Inventory" in the Media  tab for reviewed elements of pertinent patient history.  [name] participated in the discussions, expressed understanding, and voiced agreement with the above plans.  All questions were answered to his satisfaction. he is encouraged to contact clinic should he have any questions or concerns prior to his return visit.   Follow up plan: - Return in about 2 weeks (around 05/16/2023) for F/U with Meter/CGM Megan Salon Only - no Labs.  Marquis Lunch, MD [facility] [address] [place], [place] [address] Phone: [phone]  Fax: [phone]    05/02/2023, 12:51 PM  This note was partially dictated with voice recognition software. Similar sounding words can be transcribed inadequately or may not   be corrected upon review.

## 2023-05-02 NOTE — Patient Instructions (Signed)

## 2023-05-16 ENCOUNTER — Ambulatory Visit: Payer: Medicare HMO | Admitting: "Endocrinology

## 2023-05-16 ENCOUNTER — Encounter: Payer: Self-pay | Admitting: "Endocrinology

## 2023-05-16 VITALS — BP 132/64 | HR 60 | Ht 71.0 in | Wt 216.0 lb

## 2023-05-16 DIAGNOSIS — E782 Mixed hyperlipidemia: Secondary | ICD-10-CM | POA: Diagnosis not present

## 2023-05-16 DIAGNOSIS — E1165 Type 2 diabetes mellitus with hyperglycemia: Secondary | ICD-10-CM | POA: Diagnosis not present

## 2023-05-16 DIAGNOSIS — Z794 Long term (current) use of insulin: Secondary | ICD-10-CM | POA: Diagnosis not present

## 2023-05-16 DIAGNOSIS — I10 Essential (primary) hypertension: Secondary | ICD-10-CM | POA: Diagnosis not present

## 2023-05-16 MED ORDER — FREESTYLE LIBRE 3 SENSOR MISC: 1.0000 | 2 refills | Status: AC

## 2023-05-16 MED ORDER — FREESTYLE LIBRE 3 READER DEVI: 1.0000 | Freq: Once | 0 refills | Status: AC | PRN

## 2023-05-16 NOTE — Progress Notes (Signed)
Endocrinology Consult Note       05/16/2023, 12:44 PM   Subjective:    Patient ID: Alan Reeves, male    DOB: 08-Nov-1956.  Alan Reeves is being seen in consultation for management of currently uncontrolled symptomatic diabetes requested by  Rema Fendt, NP.   Past Medical History:  Diagnosis Date   Anxiety    Bipolar 1 disorder (HCC)    Depression    Diabetes mellitus without complication (HCC)    Hyperlipidemia    Hypertension    Kidney disease     Past Surgical History:  Procedure Laterality Date   COLONOSCOPY      Social History   Socioeconomic History   Marital status: Divorced    Spouse name: Not on file   Number of children: Not on file   Years of education: Not on file   Highest education level: Not on file  Occupational History   Not on file  Tobacco Use   Smoking status: Never    Passive exposure: Never   Smokeless tobacco: Never  Vaping Use   Vaping Use: Never used  Substance and Sexual Activity   Alcohol use: No   Drug use: No   Sexual activity: Not on file  Other Topics Concern   Not on file  Social History Narrative   Not on file   Social Determinants of Health   Financial Resource Strain: Low Risk  (10/03/2022)   Overall Financial Resource Strain (CARDIA)    Difficulty of Paying Living Expenses: Not hard at all  Food Insecurity: No Food Insecurity (10/03/2022)   Hunger Vital Sign    Worried About Running Out of Food in the Last Year: Never true    Ran Out of Food in the Last Year: Never true  Transportation Needs: No Transportation Needs (10/03/2022)   PRAPARE - Administrator, Civil Service (Medical): No    Lack of Transportation (Non-Medical): No  Physical Activity: Inactive (10/03/2022)   Exercise Vital Sign    Days of Exercise per Week: 0 days    Minutes of Exercise per Session: 0 min  Stress: No Stress Concern Present  (10/03/2022)   [employer] of Occupational Health - Occupational Stress Questionnaire    Feeling of Stress : Not at all  Social Connections: Socially Isolated (10/03/2022)   Social Connection and Isolation Panel [NHANES]    Frequency of Communication with Friends and Family: More than three times a week    Frequency of Social Gatherings with Friends and Family: Once a week    Attends Religious Services: Never    Database administrator or Organizations: No    Attends Banker Meetings: Never    Marital Status: Divorced    Family History  Problem Relation Age of Onset   Cancer Mother    Diabetes Mother    Hypertension Mother    Hyperlipidemia Mother    Diabetes Father    Cancer Father    Stroke Father     Outpatient Encounter Medications as of 05/16/2023  Medication Sig   Continuous Glucose Receiver (FREESTYLE LIBRE 3 READER) DEVI  1 Piece by Does not apply route once as needed for up to 1 dose.   Continuous Glucose Sensor (FREESTYLE LIBRE 3 SENSOR) MISC 1 Piece by Does not apply route every 14 (fourteen) days. Place 1 sensor on the skin every 14 days. Use to check glucose continuously   amLODipine (NORVASC) 5 MG tablet Take 1 tablet (5 mg total) by mouth daily.   glucose blood (TRUE METRIX BLOOD GLUCOSE TEST) test strip Use as instructed   insulin degludec (TRESIBA) 100 UNIT/ML FlexTouch Pen Inject 20 Units into the skin at bedtime.   Insulin Pen Needle (RELION PEN NEEDLE 31G/8MM) 31G X 8 MM MISC USE AS DIRECTED   lisinopril (ZESTRIL) 5 MG tablet Take 1 tablet (5 mg total) by mouth daily.   metFORMIN (GLUCOPHAGE) 1000 MG tablet Take 1 tablet (1,000 mg total) by mouth 2 (two) times daily with a meal.   rosuvastatin (CRESTOR) 20 MG tablet Take 1 tablet (20 mg total) by mouth daily.   triamcinolone ointment (KENALOG) 0.5 % Apply 1 application. topically 2 (two) times daily.   venlafaxine (EFFEXOR) 75 MG tablet Take 1 tablet (75 mg total) by mouth 2 (two) times daily  with a meal.   [DISCONTINUED] glipiZIDE (GLUCOTROL) 5 MG tablet Take 1 tablet (5 mg total) by mouth daily with breakfast.   No facility-administered encounter medications on file as of 05/16/2023.    ALLERGIES: No Known Allergies  VACCINATION STATUS: Immunization History  Administered Date(s) Administered   Fluad Quad(high Dose 65+) 09/29/2022   Influenza Split 11/26/2014, 10/12/2015, 09/26/2019   Influenza,inj,Quad PF,6-35 Mos 11/15/2016, 11/14/2017, 12/19/2018   Influenza-Unspecified 09/26/2019, 12/25/2020, 09/23/2021   PFIZER Comirnaty(Gray Top)Covid-19 Tri-Sucrose Vaccine 03/18/2021   PFIZER(Purple Top)SARS-COV-2 Vaccination 04/10/2020, 09/23/2021   Pneumococcal Conjugate-13 10/12/2015   Pneumococcal Polysaccharide-23 07/01/2013   Tdap 12/13/2007, 05/24/2021   Zoster Recombinat (Shingrix) 03/18/2021    Diabetes He presents for his follow-up diabetic visit. He has type 2 diabetes mellitus. Onset time: He was diagnosed at approximate age of 51 years. His disease course has been improving. Pertinent negatives for hypoglycemia include no confusion, headaches, pallor or seizures. Pertinent negatives for diabetes include no chest pain, no fatigue, no polydipsia, no polyphagia, no polyuria and no weakness. There are no hypoglycemic complications. Symptoms are improving. There are no diabetic complications. Risk factors for coronary artery disease include diabetes mellitus, dyslipidemia, male sex and hypertension. Current diabetic treatment includes insulin injections. His weight is decreasing steadily. He is following a generally unhealthy diet. When asked about meal planning, he reported none. He has not had a previous visit with a dietitian. He rarely participates in exercise. His home blood glucose trend is decreasing steadily. His breakfast blood glucose range is generally 130-140 mg/dl. His overall blood glucose range is 130-140 mg/dl. (He presents with significant improvement in his  glycemic profile average 132 for the last 7 days, 144 for the last 14 days.  His recent A1c was 8.5%. ) An ACE inhibitor/angiotensin II receptor blocker is being taken. Eye exam is current.  Hyperlipidemia This is a chronic problem. The current episode started more than 1 year ago. Pertinent negatives include no chest pain, myalgias or shortness of breath. Current antihyperlipidemic treatment includes statins. Risk factors for coronary artery disease include dyslipidemia, diabetes mellitus, hypertension and male sex.     Review of Systems  Constitutional:  Negative for chills, fatigue, fever and unexpected weight change.  HENT:  Negative for dental problem, mouth sores and trouble swallowing.   Eyes:  Negative for visual  disturbance.  Respiratory:  Negative for cough, choking, chest tightness, shortness of breath and wheezing.   Cardiovascular:  Negative for chest pain, palpitations and leg swelling.  Gastrointestinal:  Negative for abdominal distention, abdominal pain, constipation, diarrhea, nausea and vomiting.  Endocrine: Negative for polydipsia, polyphagia and polyuria.  Genitourinary:  Negative for dysuria, flank pain, hematuria and urgency.  Musculoskeletal:  Negative for back pain, gait problem, myalgias and neck pain.  Skin:  Negative for pallor, rash and wound.  Neurological:  Negative for seizures, syncope, weakness, numbness and headaches.  Psychiatric/Behavioral:  Negative for confusion and dysphoric mood.     Objective:       05/16/2023   10:28 AM 05/02/2023   10:08 AM 03/28/2023    3:39 PM  Vitals with BMI  Height 5\' 11"  5\' 11"    Weight 216 lbs 219 lbs 13 oz   BMI 30.14 30.67   Systolic 132 112 161  Diastolic 64 68 71  Pulse 60 64     BP 132/64   Pulse 60   Ht 5\' 11"  (1.803 m)   Wt 216 lb (98 kg)   BMI 30.13 kg/m   Wt Readings from Last 3 Encounters:  05/16/23 216 lb (98 kg)  05/02/23 219 lb 12.8 oz (99.7 kg)  03/28/23 216 lb 12.8 oz (98.3 kg)      CMP (  most recent) CMP     Component Value Date/Time   NA 139 09/29/2022 1647   K 4.8 09/29/2022 1647   CL 103 09/29/2022 1647   CO2 20 09/29/2022 1647   GLUCOSE 217 (H) 09/29/2022 1647   GLUCOSE 357 (H) 03/31/2014 1609   BUN 14 09/29/2022 1647   CREATININE 1.23 09/29/2022 1647   CREATININE 1.12 03/31/2014 1609   CALCIUM 9.1 09/29/2022 1647   PROT 7.1 02/18/2021 1529   ALBUMIN 4.6 02/27/2022 0000   ALBUMIN 4.4 02/18/2021 1529   AST 25 02/18/2021 1529   ALT 34 02/18/2021 1529   ALKPHOS 101 02/18/2021 1529   BILITOT 0.4 02/18/2021 1529     Diabetic Labs (most recent): Lab Results  Component Value Date   HGBA1C 8.5 (A) 05/02/2023   HGBA1C 8.7 (A) 12/27/2022   HGBA1C 8.4 (A) 09/29/2022     Lipid Panel ( most recent) Lipid Panel     Component Value Date/Time   CHOL 87 (L) 11/23/2021 1507   TRIG 230 (H) 11/23/2021 1507   HDL 32 (L) 11/23/2021 1507   CHOLHDL 2.7 11/23/2021 1507   CHOLHDL 4.8 03/31/2014 1609   VLDL 73 (H) 03/31/2014 1609   LDLCALC 20 11/23/2021 1507   LABVLDL 35 11/23/2021 1507      Lab Results  Component Value Date   TSH 0.831 02/18/2021   TSH 0.613 03/31/2014   FREET4 1.38 02/18/2021      Assessment & Plan:   1. Type 2 diabetes mellitus with hyperglycemia, with long-term current use of insulin (HCC)  - Alan Reeves has currently uncontrolled symptomatic type 2 DM since  67 years of age.  He presents with significant improvement in his glycemic profile average 132 for the last 7 days, 144 for the last 14 days.  His recent A1c was 8.5%.    Recent labs reviewed. - I had a long discussion with him about the possible risk factors and  the pathology behind its diabetes and its complications. -his diabetes is complicated by CKD, comorbid hyperlipidemia, high BMI, sedentary life and he remains at a high risk for more acute and  chronic complications which include CAD, CVA, CKD, retinopathy, and neuropathy. These are all discussed in detail with  him.  - I discussed all available options of managing his diabetes including de-escalation of medications. I have counseled him on Food as Medicine by adopting a Whole Food , Plant Predominant  ( WFPP) nutrition as recommended by [employer] of Lifestyle Medicine. Patient is encouraged to switch to  unprocessed or minimally processed  complex starch, adequate protein intake (mainly plant source), minimal liquid fat, plenty of fruits, and vegetables. -  he is advised to stick to a routine mealtimes to eat 3 complete meals a day and snack only when necessary ( to snack only to correct hypoglycemia BG <70 day time or <100 at night).   - he acknowledges that there is a room for improvement in his food and drink choices. - Suggestion is made for him to avoid simple carbohydrates  from his diet including Cakes, Sweet Desserts, Ice Cream, Soda (diet and regular), Sweet Tea, Candies, Chips, Cookies, Store Bought Juices, Alcohol , Artificial Sweeteners,  Coffee Creamer, and "Sugar-free" Products, Lemonade. This will help patient to have more stable blood glucose profile and potentially avoid unintended weight gain.  The following Lifestyle Medicine recommendations according to American College of Lifestyle Medicine  [facility]) were discussed and and offered to patient and he  agrees to start the journey:  A. Whole Foods, Plant-Based Nutrition comprising of fruits and vegetables, plant-based proteins, whole-grain carbohydrates was discussed in detail with the patient.   A list for source of those nutrients were also provided to the patient.  Patient will use only water or unsweetened tea for hydration. B.  The need to stay away from risky substances including alcohol, smoking; obtaining 7 to 9 hours of restorative sleep, at least 150 minutes of moderate intensity exercise weekly, the importance of healthy social connections,  and stress management techniques were discussed. C.  A full color page of  Calorie  density of various food groups per pound showing examples of each food groups was provided to the patient.    - he will be scheduled with Alan Reeves, RDN, CDE for individualized diabetes education.  - I have approached him with the following individualized plan to manage  his diabetes and patient agrees:   -In light of his chronic hyperglycemic burden, he will continue to need multiple medications to treat his diabetes, however he will not need prandial insulin for now.   -I advised him to continue Tresiba 20 units nightly, continue metformin 1000 mg p.o. twice daily.  He will be allowed to finish his current supply of Actos 30 mg p.o. daily until he finishes his current supply.  He is advised to discontinue  glipizide.   -This patient will benefit from a CGM.  I discussed and prescribed the freestyle libre device for him.   -In the meantime, he is advised to monitor blood glucose twice a day-daily before breakfast and at bedtime.  -Depending on his engagement, he has a chance to de-escalate on his medications further, he will be considered for incretin therapy as appropriate next visit.  - Specific targets for  A1c;  LDL, HDL,  and Triglycerides were discussed with the patient.  2) Blood Pressure /Hypertension:   -His blood pressure is controlled to target.    he is advised to continue his current medications including amlodipine 5 mg p.o. daily at breakfast, lisinopril 5 mg p.o. daily at breakfast.   3) Lipids/Hyperlipidemia:   Review  of his recent lipid panel showed  controlled  LDL at 20 .  he  is advised to continue    Crestor 20 mg daily at bedtime.  Side effects and precautions discussed with him.  4)  Weight/Diet:  Body mass index is 30.13 kg/m.  -   clearly complicating his diabetes care.   he is  a candidate for weight loss. I discussed with him the fact that loss of 5 - 10% of his  current body weight will have the most impact on his diabetes management.  The above detailed   ACLM recommendations for nutrition, exercise, sleep, social life, avoidance of risky substances, the need for restorative sleep   information will also detailed on discharge instructions.  5) Chronic Care/Health Maintenance:  -he  is on ACEI/ARB and Statin medications and  is encouraged to initiate and continue to follow up with Ophthalmology, Dentist,  Podiatrist at least yearly or according to recommendations, and advised to   stay away from smoking. I have recommended yearly flu vaccine and pneumonia vaccine at least every 5 years; moderate intensity exercise for up to 150 minutes weekly; and  sleep for 7- 9 hours a day.  - he is  advised to maintain close follow up with Rema Fendt, NP for primary care needs, as well as his other providers for optimal and coordinated care.   I spent  26  minutes in the care of the patient today including review of labs from CMP, Lipids, Thyroid Function, Hematology (current and previous including abstractions from other facilities); face-to-face time discussing  his blood glucose readings/logs, discussing hypoglycemia and hyperglycemia episodes and symptoms, medications doses, his options of short and long term treatment based on the latest standards of care / guidelines;  discussion about incorporating lifestyle medicine;  and documenting the encounter. Risk reduction counseling performed per USPSTF guidelines to reduce  obesity and cardiovascular risk factors.     Please refer to Patient Instructions for Blood Glucose Monitoring and Insulin/Medications Dosing Guide"  in media tab for additional information. Please  also refer to " Patient Self Inventory" in the Media  tab for reviewed elements of pertinent patient history.  Alan Reeves participated in the discussions, expressed understanding, and voiced agreement with the above plans.  All questions were answered to his satisfaction. he is encouraged to contact clinic should he have any questions or  concerns prior to his return visit.   Follow up plan: - Return in about 3 months (around 08/16/2023) for F/U with Pre-visit Labs, Meter/CGM/Logs, A1c here.  Marquis Lunch, MD [facility] [address] [place], [place] [address] Phone: [phone]  Fax: [phone]    05/16/2023, 12:44 PM  This note was partially dictated with voice recognition software. Similar sounding words can be transcribed inadequately or may not  be corrected upon review.

## 2023-05-16 NOTE — Patient Instructions (Signed)

## 2023-06-07 ENCOUNTER — Other Ambulatory Visit: Payer: Self-pay | Admitting: Family

## 2023-06-07 DIAGNOSIS — E782 Mixed hyperlipidemia: Secondary | ICD-10-CM

## 2023-06-13 ENCOUNTER — Telehealth: Payer: Self-pay | Admitting: Family

## 2023-06-13 NOTE — Telephone Encounter (Signed)
Called Pt and left VM to call us back so I can go over all of this with him.

## 2023-06-13 NOTE — Telephone Encounter (Signed)
Call patient with update. Patient established with Endocrinology Purcell Nails, MD) for management of diabetes. Patient's last visit with the same was on 05/16/2023. I do not see where patient has been medically released from endocrinologist care. Endocrinology to continue to manage medications/refills. If I can further assist please let me know. Thank you.

## 2023-06-13 NOTE — Telephone Encounter (Signed)
Pt came in, stated he need refill for insulin, provider Nida wants pt to stay with same insulin prescribed.

## 2023-06-15 ENCOUNTER — Other Ambulatory Visit: Payer: Self-pay | Admitting: Family

## 2023-06-15 DIAGNOSIS — E782 Mixed hyperlipidemia: Secondary | ICD-10-CM

## 2023-06-22 ENCOUNTER — Encounter (HOSPITAL_COMMUNITY): Payer: Self-pay | Admitting: Psychiatry

## 2023-06-22 ENCOUNTER — Telehealth (HOSPITAL_COMMUNITY): Payer: Medicare HMO | Admitting: Psychiatry

## 2023-06-22 DIAGNOSIS — F3341 Major depressive disorder, recurrent, in partial remission: Secondary | ICD-10-CM

## 2023-06-22 MED ORDER — VENLAFAXINE HCL 75 MG PO TABS: 75.0000 mg | ORAL_TABLET | Freq: Two times a day (BID) | ORAL | 2 refills | Status: DC

## 2023-06-22 NOTE — Progress Notes (Signed)
BH MD/PA/NP OP Progress Note Virtual Visit via Video Note  I connected with Alan ANDREOLI on 06/22/23 at 10:30 AM EDT by a video enabled telemedicine application and verified that I am speaking with the correct person using two identifiers.  Location: Patient: Home Provider: Clinic   I discussed the limitations of evaluation and management by telemedicine and the availability of in person appointments. The patient expressed understanding and agreed to proceed.  I provided 30 minutes of non-face-to-face time during this encounter.   06/22/2023 10:55 AM Alan Reeves  MRN:  [id number]  Chief Complaint: "I have been exercising"  HPI:  67 year old male seen today for follow-up psychiatric evaluation. He has a psychiatric history of bipolar disorder, anxiety, and depression.  He is currently managed on Effexor 75 mg twice daily and reports that it is effective in managing his psychiatric conditions.  Today he is well-groomed, pleasant, cooperative, engaged in conversation, maintained eye contact.  He informed Clinical research associate that he has been exercising.  He notes that it was recommended by his endocrinologist to exercise and eat lean meats like chicken and fish.  He also notes that he has increased his fruit intake.  Patient reports that his hemoglobin A1c is now closer to normal range.    Since his last visit he reports that  his mood is stable and reports that he has minimal anxiety and depression.  Today provider conducted a GAD-7 and patient scored a 0, at his last visit he scored a 0.  Provider also conducted PHQ-9 and patient scored a 1, at his last visit he scored a 0.  Patient notes that his sleep is sporadic.  He informed Clinical research associate that he is going to ask his PCP to refer him for sleep study.  He does note that he sleeps 6 hours nightly and has an appetite.  Today he denies SI/HI/VAH, mania, paranoia.  Patient informed Clinical research associate that his father has dementia.  Recently he notes that his sister moved  in to help take care of his father.  He informed Clinical research associate that he is enjoying the time that he has left to spend with his father.  No medication changes made today.  Patient agreeable to continue medication as prescribed.  No other concerns at this time. Visit Diagnosis:    ICD-10-CM   1. Recurrent major depressive disorder, in partial remission (HCC)  F33.41 venlafaxine (EFFEXOR) 75 MG tablet       Past Psychiatric History: bipolar disorder, anxiety, and depression  Past Medical History:  Past Medical History:  Diagnosis Date   Anxiety    Bipolar 1 disorder (HCC)    Depression    Diabetes mellitus without complication (HCC)    Hyperlipidemia    Hypertension    Kidney disease     Past Surgical History:  Procedure Laterality Date   COLONOSCOPY      Family Psychiatric History:  Maternal uncles alcohol use, Sister bipolar disorder, paternal aunts alcohol use   Family History:  Family History  Problem Relation Age of Onset   Cancer Mother    Diabetes Mother    Hypertension Mother    Hyperlipidemia Mother    Diabetes Father    Cancer Father    Stroke Father     Social History:  Social History   Socioeconomic History   Marital status: Divorced    Spouse name: Not on file   Number of children: Not on file   Years of education: Not on file   Highest education  level: Not on file  Occupational History   Not on file  Tobacco Use   Smoking status: Never    Passive exposure: Never   Smokeless tobacco: Never  Vaping Use   Vaping status: Never Used  Substance and Sexual Activity   Alcohol use: No   Drug use: No   Sexual activity: Not on file  Other Topics Concern   Not on file  Social History Narrative   Not on file   Social Determinants of Health   Financial Resource Strain: Low Risk  (10/03/2022)   Overall Financial Resource Strain (CARDIA)    Difficulty of Paying Living Expenses: Not hard at all  Food Insecurity: No Food Insecurity (10/03/2022)   Hunger  Vital Sign    Worried About Running Out of Food in the Last Year: Never true    Ran Out of Food in the Last Year: Never true  Transportation Needs: No Transportation Needs (10/03/2022)   PRAPARE - Administrator, Civil Service (Medical): No    Lack of Transportation (Non-Medical): No  Physical Activity: Inactive (10/03/2022)   Exercise Vital Sign    Days of Exercise per Week: 0 days    Minutes of Exercise per Session: 0 min  Stress: No Stress Concern Present (10/03/2022)   [employer] of Occupational Health - Occupational Stress Questionnaire    Feeling of Stress : Not at all  Social Connections: Socially Isolated (10/03/2022)   Social Connection and Isolation Panel [NHANES]    Frequency of Communication with Friends and Family: More than three times a week    Frequency of Social Gatherings with Friends and Family: Once a week    Attends Religious Services: Never    Database administrator or Organizations: No    Attends Engineer, structural: Never    Marital Status: Divorced    Allergies: No Known Allergies  Metabolic Disorder Labs: Lab Results  Component Value Date   HGBA1C 8.5 (A) 05/02/2023   No results found for: "PROLACTIN" Lab Results  Component Value Date   CHOL 87 (L) 11/23/2021   TRIG 230 (H) 11/23/2021   HDL 32 (L) 11/23/2021   CHOLHDL 2.7 11/23/2021   VLDL 73 (H) 03/31/2014   LDLCALC 20 11/23/2021   LDLCALC 100 (H) 02/18/2021   Lab Results  Component Value Date   TSH 0.831 02/18/2021   TSH 0.613 03/31/2014    Therapeutic Level Labs: Lab Results  Component Value Date   LITHIUM 0.5 10/26/2021   LITHIUM 0.6 06/27/2021   No results found for: "VALPROATE" No results found for: "CBMZ"  Current Medications: Current Outpatient Medications  Medication Sig Dispense Refill   amLODipine (NORVASC) 5 MG tablet Take 1 tablet (5 mg total) by mouth daily. 90 tablet 0   Continuous Glucose Receiver (FREESTYLE LIBRE 3 READER) DEVI 1 Piece  by Does not apply route once as needed for up to 1 dose. 1 each 0   Continuous Glucose Sensor (FREESTYLE LIBRE 3 SENSOR) MISC 1 Piece by Does not apply route every 14 (fourteen) days. Place 1 sensor on the skin every 14 days. Use to check glucose continuously 2 each 2   glucose blood (TRUE METRIX BLOOD GLUCOSE TEST) test strip Use as instructed 100 each 12   Insulin Degludec FlexTouch 100 UNIT/ML SOPN INJECT 20 UNITS SUBCUTANEOUSLY ONCE DAILY 6 mL 1   Insulin Pen Needle (RELION PEN NEEDLE 31G/8MM) 31G X 8 MM MISC USE AS DIRECTED 100 each 0   lisinopril (ZESTRIL)  5 MG tablet Take 1 tablet (5 mg total) by mouth daily. 90 tablet 0   metFORMIN (GLUCOPHAGE) 1000 MG tablet Take 1 tablet (1,000 mg total) by mouth 2 (two) times daily with a meal. 180 tablet 0   rosuvastatin (CRESTOR) 20 MG tablet Take 1 tablet (20 mg total) by mouth daily. 90 tablet 0   triamcinolone ointment (KENALOG) 0.5 % Apply 1 application. topically 2 (two) times daily. 60 g 1   venlafaxine (EFFEXOR) 75 MG tablet Take 1 tablet (75 mg total) by mouth 2 (two) times daily with a meal. 120 tablet 2   No current facility-administered medications for this visit.     Musculoskeletal: Strength & Muscle Tone: within normal limits and Telehealth visit Gait & Station: normal, Telehealth visit Patient leans: N/A  Psychiatric Specialty Exam: Review of Systems  There were no vitals taken for this visit.There is no height or weight on file to calculate BMI.  General Appearance: Well Groomed  Eye Contact:  Good  Speech:  Clear and Coherent and Normal Rate  Volume:  Normal  Mood:  Euthymic  Affect:  Appropriate and Congruent  Thought Process:  Coherent, Goal Directed, and NA  Orientation:  Full (Time, Place, and Person)  Thought Content: WDL and Logical   Suicidal Thoughts:  No  Homicidal Thoughts:  No  Memory:  Immediate;   Good Recent;   Good Remote;   Good  Judgement:  Good  Insight:  Good  Psychomotor Activity:  Normal   Concentration:  Concentration: Good and Attention Span: Good  Recall:  Good  Fund of Knowledge: Good  Language: Good  Akathisia:  No  Handed:  Right  AIMS (if indicated): not done  Assets:  Communication Skills Desire for Improvement Financial Resources/Insurance Housing Physical Health Social Support  ADL's:  Intact  Cognition: WNL  Sleep:  Good   Screenings: GAD-7    Flowsheet Row Video Visit from 06/22/2023 in [facility] Video Visit from 04/20/2023 in [facility] Video Visit from 02/07/2023 in [facility] Counselor from 10/03/2022 in [facility]  Total GAD-7 Score 0 0 0 0      PHQ2-9    Flowsheet Row Video Visit from 06/22/2023 in [facility] Video Visit from 04/20/2023 in [facility] Office Visit from 03/28/2023 in [facility] at [facility] Video Visit from 02/07/2023 in [facility] Office Visit from 12/27/2022 in [facility] at [facility]  PHQ-2 Total Score 0 0 0 0 0  PHQ-9 Total Score 1 1 0 0 --      Flowsheet Row ED from 10/26/2022 in [facility] Emergency Department at [facility] Most recent reading at 10/26/2022  6:13 PM Counselor from 10/03/2022 in [facility] Most recent reading at 10/03/2022 10:25 AM Counselor from 10/03/2022 in [facility] Most recent reading at 10/03/2022  8:16 AM  C-SSRS RISK CATEGORY No Risk No Risk No Risk        Assessment and Plan: Patient notes that at times his sleep is sporadic but notes that he would like his PCP to refer him to a sleep study.  He informed Clinical research associate that his mood is stable and notes that he has minimal anxiety depression.  He has been exercising more frequently and trying to work on his physical health.  No medication changes  made today.  Patient agreeable to continue medication as prescribed  1. Recurrent major depressive disorder, in partial remission (HCC)  Continue- venlafaxine (EFFEXOR) 75 MG tablet; Take 1 tablet (75 mg total) by mouth 2 (two) times daily with a meal.  Dispense: 120 tablet; Refill: 2    Collaboration of Care: Collaboration of Care: Other provider involved in patient's care AEB PCP  Patient/Guardian was advised Release of Information must be obtained prior to any record release in order to collaborate their care with an outside provider. Patient/Guardian was advised if they have not already done so to contact the registration department to sign all necessary forms in order for [place] to release information regarding their care.   Consent: Patient/Guardian gives verbal consent for treatment and assignment of benefits for services provided during this visit. Patient/Guardian expressed understanding and agreed to proceed.   Follow-up in 3 months Shanna Cisco, NP 06/22/2023, 10:55 AM

## 2023-08-08 ENCOUNTER — Other Ambulatory Visit: Payer: Self-pay | Admitting: Pharmacist

## 2023-08-08 DIAGNOSIS — I10 Essential (primary) hypertension: Secondary | ICD-10-CM

## 2023-08-08 DIAGNOSIS — E785 Hyperlipidemia, unspecified: Secondary | ICD-10-CM

## 2023-08-08 MED ORDER — LISINOPRIL 5 MG PO TABS: 5.0000 mg | ORAL_TABLET | Freq: Every day | ORAL | 0 refills | Status: DC

## 2023-08-08 MED ORDER — ROSUVASTATIN CALCIUM 20 MG PO TABS
20.0000 mg | ORAL_TABLET | Freq: Every day | ORAL | 0 refills | Status: DC
Start: 2023-08-08 — End: 2024-05-06

## 2023-08-08 NOTE — Progress Notes (Signed)
Patient ID: Alan Reeves, male   DOB: 02-04-1956, 67 y.o.   MRN: [id number]  Pharmacy Quality Measure Review  This patient is appearing on a report for being at risk of failing the adherence measure for cholesterol (statin) and hypertension (ACEi/ARB) medications this calendar year.   Medication: lisinopril Last fill date: 07/21/2023 for 30 day supply  Medication: rosuvastatin Last fill date: 07/10/2023 for 30 day supply  Insurance report was not up to date. I resent rxns for 90-ds. Mychart message sent to patient.  Butch Penny, PharmD, P[place]y [place], CPP Clinical Pharmacist [facility] [phone]

## 2023-08-11 ENCOUNTER — Other Ambulatory Visit: Payer: Self-pay | Admitting: Family

## 2023-08-11 DIAGNOSIS — I10 Essential (primary) hypertension: Secondary | ICD-10-CM
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
## 2023-08-22 ENCOUNTER — Ambulatory Visit: Payer: Medicare Other | Admitting: "Endocrinology

## 2023-08-23 ENCOUNTER — Other Ambulatory Visit: Payer: Self-pay

## 2023-08-23 DIAGNOSIS — I10 Essential (primary) hypertension: Secondary | ICD-10-CM

## 2023-08-23 MED ORDER — AMLODIPINE BESYLATE 5 MG PO TABS
5.0000 mg | ORAL_TABLET | Freq: Every day | ORAL | 0 refills | Status: DC
Start: 2023-08-23 — End: 2023-11-21

## 2023-08-24 ENCOUNTER — Ambulatory Visit (INDEPENDENT_AMBULATORY_CARE_PROVIDER_SITE_OTHER): Payer: Self-pay | Admitting: "Endocrinology

## 2023-08-24 ENCOUNTER — Encounter: Payer: Self-pay | Admitting: "Endocrinology

## 2023-08-24 VITALS — BP 118/74 | HR 60 | Ht 71.0 in | Wt 202.0 lb

## 2023-08-24 DIAGNOSIS — E782 Mixed hyperlipidemia: Secondary | ICD-10-CM

## 2023-08-24 DIAGNOSIS — Z794 Long term (current) use of insulin: Secondary | ICD-10-CM

## 2023-08-24 DIAGNOSIS — E1165 Type 2 diabetes mellitus with hyperglycemia: Secondary | ICD-10-CM

## 2023-08-24 DIAGNOSIS — I10 Essential (primary) hypertension: Secondary | ICD-10-CM

## 2023-08-24 LAB — POCT GLYCOSYLATED HEMOGLOBIN (HGB A1C): HbA1c, POC (controlled diabetic range): 9 % — AB (ref 0.0–7.0)

## 2023-08-24 MED ORDER — EMPAGLIFLOZIN 10 MG PO TABS: 10.0000 mg | ORAL_TABLET | Freq: Every day | ORAL | 1 refills | Status: DC

## 2023-08-24 NOTE — Progress Notes (Signed)
08/24/2023, 11:20 AM   Endocrinology follow-up note  Subjective:    Patient ID: Alan Reeves, male    DOB: 1956-06-25.  Alan Reeves is being seen in follow-up after he was seen in consultation for management of currently uncontrolled symptomatic diabetes requested by  Rema Fendt, NP.   Past Medical History:  Diagnosis Date   Anxiety    Bipolar 1 disorder (HCC)    Depression    Diabetes mellitus without complication (HCC)    Hyperlipidemia    Hypertension    Kidney disease     Past Surgical History:  Procedure Laterality Date   COLONOSCOPY      Social History   Socioeconomic History   Marital status: Divorced    Spouse name: Not on file   Number of children: Not on file   Years of education: Not on file   Highest education level: Not on file  Occupational History   Not on file  Tobacco Use   Smoking status: Never    Passive exposure: Never   Smokeless tobacco: Never  Vaping Use   Vaping status: Never Used  Substance and Sexual Activity   Alcohol use: No   Drug use: No   Sexual activity: Not on file  Other Topics Concern   Not on file  Social History Narrative   Not on file   Social Determinants of Health   Financial Resource Strain: Low Risk  (10/03/2022)   Overall Financial Resource Strain (CARDIA)    Difficulty of Paying Living Expenses: Not hard at all  Food Insecurity: No Food Insecurity (10/03/2022)   Hunger Vital Sign    Worried About Running Out of Food in the Last Year: Never true    Ran Out of Food in the Last Year: Never true  Transportation Needs: No Transportation Needs (10/03/2022)   PRAPARE - Administrator, Civil Service (Medical): No    Lack of Transportation (Non-Medical): No  Physical Activity: Inactive (10/03/2022)   Exercise Vital Sign    Days of Exercise per Week: 0 days    Minutes of Exercise per Session: 0 min  Stress: No Stress Concern Present (10/03/2022)   [employer]  of Occupational Health - Occupational Stress Questionnaire    Feeling of Stress : Not at all  Social Connections: Socially Isolated (10/03/2022)   Social Connection and Isolation Panel [NHANES]    Frequency of Communication with Friends and Family: More than three times a week    Frequency of Social Gatherings with Friends and Family: Once a week    Attends Religious Services: Never    Database administrator or Organizations: No    Attends Engineer, structural: Never    Marital Status: Divorced    Family History  Problem Relation Age of Onset   Cancer Mother    Diabetes Mother    Hypertension Mother    Hyperlipidemia Mother    Diabetes Father    Cancer Father    Stroke Father     Outpatient Encounter Medications as of 08/24/2023  Medication Sig   empagliflozin (JARDIANCE) 10 MG TABS tablet Take 1 tablet (10 mg total) by mouth daily before breakfast.   Insulin Glargine (BASAGLAR KWIKPEN [place]) Inject 40 Units into the skin at bedtime.   amLODipine (NORVASC) 5 MG tablet Take 1 tablet (5 mg total) by mouth daily.   Continuous Glucose Receiver (FREESTYLE LIBRE 3 READER) DEVI 1 Piece by Does not  apply route once as needed for up to 1 dose.   Continuous Glucose Sensor (FREESTYLE LIBRE 3 SENSOR) MISC 1 Piece by Does not apply route every 14 (fourteen) days. Place 1 sensor on the skin every 14 days. Use to check glucose continuously   glucose blood (TRUE METRIX BLOOD GLUCOSE TEST) test strip Use as instructed   Insulin Pen Needle (RELION PEN NEEDLE 31G/8MM) 31G X 8 MM MISC USE AS DIRECTED   lisinopril (ZESTRIL) 5 MG tablet Take 1 tablet (5 mg total) by mouth daily.   metFORMIN (GLUCOPHAGE) 1000 MG tablet Take 1 tablet (1,000 mg total) by mouth 2 (two) times daily with a meal.   rosuvastatin (CRESTOR) 20 MG tablet Take 1 tablet (20 mg total) by mouth daily.   triamcinolone ointment (KENALOG) 0.5 % Apply 1 application. topically 2 (two) times daily.   venlafaxine (EFFEXOR) 75 MG tablet  Take 1 tablet (75 mg total) by mouth 2 (two) times daily with a meal.   [DISCONTINUED] Insulin Degludec FlexTouch 100 UNIT/ML SOPN INJECT 20 UNITS SUBCUTANEOUSLY ONCE DAILY   No facility-administered encounter medications on file as of 08/24/2023.    ALLERGIES: No Known Allergies  VACCINATION STATUS: Immunization History  Administered Date(s) Administered   Fluad Quad(high Dose 65+) 09/29/2022   Influenza Split 11/26/2014, 10/12/2015, 09/26/2019   Influenza,inj,Quad PF,6-35 Mos 11/15/2016, 11/14/2017, 12/19/2018   Influenza-Unspecified 09/26/2019, 12/25/2020, 09/23/2021   PFIZER Comirnaty(Gray Top)Covid-19 Tri-Sucrose Vaccine 03/18/2021   PFIZER(Purple Top)SARS-COV-2 Vaccination 04/10/2020, 09/23/2021   Pneumococcal Conjugate-13 10/12/2015   Pneumococcal Polysaccharide-23 07/01/2013   Tdap 12/13/2007, 05/24/2021   Zoster Recombinant(Shingrix) 03/18/2021    Diabetes He presents for his follow-up diabetic visit. He has type 2 diabetes mellitus. Onset time: He was diagnosed at approximate age of 51 years. His disease course has been worsening. Pertinent negatives for hypoglycemia include no confusion, headaches, pallor or seizures. Pertinent negatives for diabetes include no chest pain, no fatigue, no polydipsia, no polyphagia, no polyuria and no weakness. There are no hypoglycemic complications. Symptoms are worsening. There are no diabetic complications. Risk factors for coronary artery disease include diabetes mellitus, dyslipidemia, male sex and hypertension. Current diabetic treatment includes insulin injections. His weight is fluctuating minimally. He is following a generally unhealthy diet. When asked about meal planning, he reported none. He has not had a previous visit with a dietitian. He rarely participates in exercise. His home blood glucose trend is increasing steadily. His breakfast blood glucose range is generally 180-200 mg/dl. His bedtime blood glucose range is generally 180-200  mg/dl. His overall blood glucose range is 180-200 mg/dl. (He presents with worsening glycemic profile and point-of-care A1c of 9% increasing from 8.5%.  He did not document any hypoglycemia.   ) An ACE inhibitor/angiotensin II receptor blocker is being taken. Eye exam is current.  Hyperlipidemia This is a chronic problem. The current episode started more than 1 year ago. Pertinent negatives include no chest pain, myalgias or shortness of breath. Current antihyperlipidemic treatment includes statins. Risk factors for coronary artery disease include dyslipidemia, diabetes mellitus, hypertension and male sex.     Review of Systems  Constitutional:  Negative for chills, fatigue, fever and unexpected weight change.  HENT:  Negative for dental problem, mouth sores and trouble swallowing.   Eyes:  Negative for visual disturbance.  Respiratory:  Negative for cough, choking, chest tightness, shortness of breath and wheezing.   Cardiovascular:  Negative for chest pain, palpitations and leg swelling.  Gastrointestinal:  Negative for abdominal distention, abdominal pain, constipation, diarrhea, nausea  and vomiting.  Endocrine: Negative for polydipsia, polyphagia and polyuria.  Genitourinary:  Negative for dysuria, flank pain, hematuria and urgency.  Musculoskeletal:  Negative for back pain, gait problem, myalgias and neck pain.  Skin:  Negative for pallor, rash and wound.  Neurological:  Negative for seizures, syncope, weakness, numbness and headaches.  Psychiatric/Behavioral:  Negative for confusion and dysphoric mood.     Objective:       08/24/2023   10:20 AM 05/16/2023   10:28 AM 05/02/2023   10:08 AM  Vitals with BMI  Height 5\' 11"  5\' 11"  5\' 11"   Weight 202 lbs 216 lbs 219 lbs 13 oz  BMI 28.19 30.14 30.67  Systolic 118 132 161  Diastolic 74 64 68  Pulse 60 60 64    BP 118/74   Pulse 60   Ht 5\' 11"  (1.803 m)   Wt 202 lb (91.6 kg)   BMI 28.17 kg/m   Wt Readings from Last 3 Encounters:   08/24/23 202 lb (91.6 kg)  05/16/23 216 lb (98 kg)  05/02/23 219 lb 12.8 oz (99.7 kg)      CMP ( most recent) CMP     Component Value Date/Time   NA 139 08/22/2023 0811   K 4.8 08/22/2023 0811   CL 102 08/22/2023 0811   CO2 18 (L) 08/22/2023 0811   GLUCOSE 119 (H) 08/22/2023 0811   GLUCOSE 357 (H) 03/31/2014 1609   BUN 15 08/22/2023 0811   CREATININE 1.39 (H) 08/22/2023 0811   CREATININE 1.12 03/31/2014 1609   CALCIUM 9.5 08/22/2023 0811   PROT 7.4 08/22/2023 0811   ALBUMIN 4.4 08/22/2023 0811   AST 23 08/22/2023 0811   ALT 17 08/22/2023 0811   ALKPHOS 115 08/22/2023 0811   BILITOT 0.4 08/22/2023 0811     Diabetic Labs (most recent): Lab Results  Component Value Date   HGBA1C 9.0 (A) 08/24/2023   HGBA1C 8.5 (A) 05/02/2023   HGBA1C 8.7 (A) 12/27/2022     Lipid Panel ( most recent) Lipid Panel     Component Value Date/Time   CHOL 110 08/22/2023 0811   TRIG 214 (H) 08/22/2023 0811   HDL 31 (L) 08/22/2023 0811   CHOLHDL 3.5 08/22/2023 0811   CHOLHDL 4.8 03/31/2014 1609   VLDL 73 (H) 03/31/2014 1609   LDLCALC 45 08/22/2023 0811   LABVLDL 34 08/22/2023 0811      Lab Results  Component Value Date   TSH 0.761 08/22/2023   TSH 0.831 02/18/2021   TSH 0.613 03/31/2014   FREET4 1.42 08/22/2023   FREET4 1.38 02/18/2021      Assessment & Plan:   1. Type 2 diabetes mellitus with hyperglycemia, with long-term current use of insulin (HCC)  - Alan Reeves has currently uncontrolled symptomatic type 2 DM since  67 years of age.  He presents with worsening glycemic profile and point-of-care A1c of 9% increasing from 8.5%.  He did not document any hypoglycemia.     Recent labs reviewed. - I had a long discussion with him about the possible risk factors and  the pathology behind its diabetes and its complications. -his diabetes is complicated by CKD, comorbid hyperlipidemia, high BMI, sedentary life and he remains at a high risk for more acute and chronic  complications which include CAD, CVA, CKD, retinopathy, and neuropathy. These are all discussed in detail with him.  - I discussed all available options of managing his diabetes including de-escalation of medications. I have counseled him on Food as  Medicine by adopting a Whole Food , Plant Predominant  ( WFPP) nutrition as recommended by [employer] of Lifestyle Medicine. Patient is encouraged to switch to  unprocessed or minimally processed  complex starch, adequate protein intake (mainly plant source), minimal liquid fat, plenty of fruits, and vegetables. -  he is advised to stick to a routine mealtimes to eat 3 complete meals a day and snack only when necessary ( to snack only to correct hypoglycemia BG <70 day time or <100 at night).   - he acknowledges that there is a room for improvement in his food and drink choices. - Suggestion is made for him to avoid simple carbohydrates  from his diet including Cakes, Sweet Desserts, Ice Cream, Soda (diet and regular), Sweet Tea, Candies, Chips, Cookies, Store Bought Juices, Alcohol , Artificial Sweeteners,  Coffee Creamer, and "Sugar-free" Products, Lemonade. This will help patient to have more stable blood glucose profile and potentially avoid unintended weight gain.  The following Lifestyle Medicine recommendations according to American College of Lifestyle Medicine  [facility]) were discussed and and offered to patient and he  agrees to start the journey:  A. Whole Foods, Plant-Based Nutrition comprising of fruits and vegetables, plant-based proteins, whole-grain carbohydrates was discussed in detail with the patient.   A list for source of those nutrients were also provided to the patient.  Patient will use only water or unsweetened tea for hydration. B.  The need to stay away from risky substances including alcohol, smoking; obtaining 7 to 9 hours of restorative sleep, at least 150 minutes of moderate intensity exercise weekly, the importance of healthy  social connections,  and stress management techniques were discussed. C.  A full color page of  Calorie density of various food groups per pound showing examples of each food groups was provided to the patient.   - he will be scheduled with Norm Salt, RDN, CDE for individualized diabetes education.  - I have approached him with the following individualized plan to manage  his diabetes and patient agrees:   -In light of his presentation with loss of control, he will need a higher dose of insulin.  Accordingly, I advised him to increase Basaglar to 40 units nightly, advised to continue metformin 1000 mg p.o. twice daily.  I discussed and added Jardiance 10 mg p.o. daily at breakfast.  Side effects and precautions discussed with him.    -This patient did not get coverage for a CGM device.    -He is advised to continue monitor blood glucose at least twice a day-daily before breakfast and at bedtime.     - Specific targets for  A1c;  LDL, HDL,  and Triglycerides were discussed with the patient.  2) Blood Pressure /Hypertension:   His blood pressure was controlled to target.    he is advised to continue his current medications including amlodipine 5 mg p.o. daily at breakfast, lisinopril 5 mg p.o. daily at breakfast.   3) Lipids/Hyperlipidemia:   Review of his recent lipid panel showed  controlled  LDL at 20 .  he  is advised to continue Crestor 20 mg p.o. daily at bedtime.  Side effects and precautions discussed with him.     4)  Weight/Diet:  Body mass index is 28.17 kg/m.  -   clearly complicating his diabetes care.   he is  a candidate for weight loss. I discussed with him the fact that loss of 5 - 10% of his  current body weight will have  the most impact on his diabetes management.  The above detailed  ACLM recommendations for nutrition, exercise, sleep, social life, avoidance of risky substances, the need for restorative sleep   information will also detailed on discharge  instructions.  5) Chronic Care/Health Maintenance:  -he  is on ACEI/ARB and Statin medications and  is encouraged to initiate and continue to follow up with Ophthalmology, Dentist,  Podiatrist at least yearly or according to recommendations, and advised to   stay away from smoking. I have recommended yearly flu vaccine and pneumonia vaccine at least every 5 years; moderate intensity exercise for up to 150 minutes weekly; and  sleep for 7- 9 hours a day.  - he is  advised to maintain close follow up with Rema Fendt, NP for primary care needs, as well as his other providers for optimal and coordinated care.   I spent  41  minutes in the care of the patient today including review of labs from CMP, Lipids, Thyroid Function, Hematology (current and previous including abstractions from other facilities); face-to-face time discussing  his blood glucose readings/logs, discussing hypoglycemia and hyperglycemia episodes and symptoms, medications doses, his options of short and long term treatment based on the latest standards of care / guidelines;  discussion about incorporating lifestyle medicine;  and documenting the encounter. Risk reduction counseling performed per USPSTF guidelines to reduce  cardiovascular risk factors.     Please refer to Patient Instructions for Blood Glucose Monitoring and Insulin/Medications Dosing Guide"  in media tab for additional information. Please  also refer to " Patient Self Inventory" in the Media  tab for reviewed elements of pertinent patient history.  Alan Reeves participated in the discussions, expressed understanding, and voiced agreement with the above plans.  All questions were answered to his satisfaction. he is encouraged to contact clinic should he have any questions or concerns prior to his return visit.    Follow up plan: - Return in about 3 months (around 11/23/2023) for Bring Meter/CGM Device/Logs- A1c in Office.  Marquis Lunch, MD [facility] [address] [place], [place] [address] Phone: [phone]  Fax: [phone]    08/24/2023, 11:20 AM  This note was partially dictated with voice recognition software. Similar sounding words can be transcribed inadequately or may not  be corrected upon review.

## 2023-08-24 NOTE — Patient Instructions (Signed)

## 2023-09-05 ENCOUNTER — Other Ambulatory Visit: Payer: Self-pay | Admitting: "Endocrinology

## 2023-09-05 DIAGNOSIS — E782 Mixed hyperlipidemia: Secondary | ICD-10-CM

## 2023-09-07 ENCOUNTER — Telehealth: Payer: Self-pay

## 2023-09-07 ENCOUNTER — Other Ambulatory Visit: Payer: Self-pay | Admitting: "Endocrinology

## 2023-09-07 MED ORDER — GLIPIZIDE ER 5 MG PO TB24: 5.0000 mg | ORAL_TABLET | Freq: Every day | ORAL | 1 refills | Status: DC

## 2023-09-07 NOTE — Telephone Encounter (Signed)
Left a message requesting pt return call to the office. 

## 2023-09-07 NOTE — Telephone Encounter (Signed)
Pt called stating the Rx for Jardiance even with a Rx copay card is to costly. States he has no Part B coverage.

## 2023-09-10 NOTE — Telephone Encounter (Signed)
Left a message making pt aware the switch from Jardiance to Glipizide 5mg  po daily with breakfast per Dr.Nida's orders. Rx sent.

## 2023-09-14 ENCOUNTER — Other Ambulatory Visit: Payer: Self-pay | Admitting: Family

## 2023-09-14 DIAGNOSIS — Z1211 Encounter for screening for malignant neoplasm of colon: Secondary | ICD-10-CM

## 2023-09-14 DIAGNOSIS — Z1212 Encounter for screening for malignant neoplasm of rectum: Secondary | ICD-10-CM

## 2023-09-18 ENCOUNTER — Other Ambulatory Visit: Payer: Self-pay

## 2023-09-18 DIAGNOSIS — Z794 Long term (current) use of insulin: Secondary | ICD-10-CM

## 2023-09-18 MED ORDER — BASAGLAR KWIKPEN 100 UNIT/ML ~~LOC~~ SOPN
40.0000 [IU] | PEN_INJECTOR | Freq: Every day | SUBCUTANEOUS | 0 refills | Status: DC
Start: 2023-09-18 — End: 2023-10-29

## 2023-09-21 ENCOUNTER — Telehealth (HOSPITAL_COMMUNITY): Payer: No Payment, Other | Admitting: Psychiatry

## 2023-09-21 ENCOUNTER — Encounter (HOSPITAL_COMMUNITY): Payer: Self-pay

## 2023-09-28 ENCOUNTER — Ambulatory Visit: Payer: 59 | Admitting: Family

## 2023-10-01 ENCOUNTER — Other Ambulatory Visit: Payer: Self-pay | Admitting: Family

## 2023-10-01 DIAGNOSIS — E782 Mixed hyperlipidemia: Secondary | ICD-10-CM
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
## 2023-10-03 ENCOUNTER — Encounter (HOSPITAL_COMMUNITY): Payer: Self-pay | Admitting: Psychiatry

## 2023-10-03 ENCOUNTER — Telehealth (INDEPENDENT_AMBULATORY_CARE_PROVIDER_SITE_OTHER): Payer: No Payment, Other | Admitting: Psychiatry

## 2023-10-03 DIAGNOSIS — F3341 Major depressive disorder, recurrent, in partial remission: Secondary | ICD-10-CM

## 2023-10-03 MED ORDER — VENLAFAXINE HCL 75 MG PO TABS: 75.0000 mg | ORAL_TABLET | Freq: Two times a day (BID) | ORAL | 2 refills | Status: DC

## 2023-10-03 NOTE — Progress Notes (Signed)
BH MD/PA/NP OP Progress Note Virtual Visit via Video Note  I connected with Alan Reeves on 10/03/23 at  3:00 PM EDT by a video enabled telemedicine application and verified that I am speaking with the correct person using two identifiers.  Location: Patient: Home Provider: Clinic   I discussed the limitations of evaluation and management by telemedicine and the availability of in person appointments. The patient expressed understanding and agreed to proceed.  I provided 30 minutes of non-face-to-face time during this encounter.   10/03/2023 3:02 PM Alan Reeves  MRN:  [id number]  Chief Complaint: "I have been having difficulty walking"  HPI:  67 year old male seen today for follow-up psychiatric evaluation. He has a psychiatric history of bipolar disorder, anxiety, and depression.  He is currently managed on Effexor 75 mg twice daily and reports that it is effective in managing his psychiatric conditions.  Today he is well-groomed, pleasant, cooperative, engaged in conversation, maintained eye contact.  He informed Clinical research associate that recently he has been having issues walking.  He notes that a few months ago he was more active and exercising but notes now that he has pain when he exercises.  Patient informed Clinical research associate that he is looking to do a yoga.    Since his last visit he informed writer that his anxiety and depression are well-managed.  Today provider conducted GAD-7 and patient scored a 0, at his last visit he scored a 0.  Provider also conducted PHQ-9 and patient scored a 1, at his last visit he scored a 1.  He endorses adequate sleep and appetite.  Patient notes that he has lost 15 pounds since his last visit.    No medication changes made today.  Patient agreeable to continue medication as prescribed.  No other concerns noted at this time.   Visit Diagnosis:    ICD-10-CM   1. Recurrent major depressive disorder, in partial remission (HCC)  F33.41 venlafaxine (EFFEXOR) 75 MG tablet         Past Psychiatric History: bipolar disorder, anxiety, and depression  Past Medical History:  Past Medical History:  Diagnosis Date   Anxiety    Bipolar 1 disorder (HCC)    Depression    Diabetes mellitus without complication (HCC)    Hyperlipidemia    Hypertension    Kidney disease     Past Surgical History:  Procedure Laterality Date   COLONOSCOPY      Family Psychiatric History:  Maternal uncles alcohol use, Sister bipolar disorder, paternal aunts alcohol use   Family History:  Family History  Problem Relation Age of Onset   Cancer Mother    Diabetes Mother    Hypertension Mother    Hyperlipidemia Mother    Diabetes Father    Cancer Father    Stroke Father     Social History:  Social History   Socioeconomic History   Marital status: Divorced    Spouse name: Not on file   Number of children: Not on file   Years of education: Not on file   Highest education level: Not on file  Occupational History   Not on file  Tobacco Use   Smoking status: Never    Passive exposure: Never   Smokeless tobacco: Never  Vaping Use   Vaping status: Never Used  Substance and Sexual Activity   Alcohol use: No   Drug use: No   Sexual activity: Not on file  Other Topics Concern   Not on file  Social History Narrative  Not on file   Social Determinants of Health   Financial Resource Strain: Low Risk  (10/03/2022)   Overall Financial Resource Strain (CARDIA)    Difficulty of Paying Living Expenses: Not hard at all  Food Insecurity: No Food Insecurity (10/03/2022)   Hunger Vital Sign    Worried About Running Out of Food in the Last Year: Never true    Ran Out of Food in the Last Year: Never true  Transportation Needs: No Transportation Needs (10/03/2022)   PRAPARE - Administrator, Civil Service (Medical): No    Lack of Transportation (Non-Medical): No  Physical Activity: Inactive (10/03/2022)   Exercise Vital Sign    Days of Exercise per Week: 0  days    Minutes of Exercise per Session: 0 min  Stress: No Stress Concern Present (10/03/2022)   [employer] of Occupational Health - Occupational Stress Questionnaire    Feeling of Stress : Not at all  Social Connections: Socially Isolated (10/03/2022)   Social Connection and Isolation Panel [NHANES]    Frequency of Communication with Friends and Family: More than three times a week    Frequency of Social Gatherings with Friends and Family: Once a week    Attends Religious Services: Never    Database administrator or Organizations: No    Attends Engineer, structural: Never    Marital Status: Divorced    Allergies: No Known Allergies  Metabolic Disorder Labs: Lab Results  Component Value Date   HGBA1C 9.0 (A) 08/24/2023   No results found for: "PROLACTIN" Lab Results  Component Value Date   CHOL 110 08/22/2023   TRIG 214 (H) 08/22/2023   HDL 31 (L) 08/22/2023   CHOLHDL 3.5 08/22/2023   VLDL 73 (H) 03/31/2014   LDLCALC 45 08/22/2023   LDLCALC 20 11/23/2021   Lab Results  Component Value Date   TSH 0.761 08/22/2023   TSH 0.831 02/18/2021    Therapeutic Level Labs: Lab Results  Component Value Date   LITHIUM 0.5 10/26/2021   LITHIUM 0.6 06/27/2021   No results found for: "VALPROATE" No results found for: "CBMZ"  Current Medications: Current Outpatient Medications  Medication Sig Dispense Refill   amLODipine (NORVASC) 5 MG tablet Take 1 tablet (5 mg total) by mouth daily. 90 tablet 0   Continuous Glucose Receiver (FREESTYLE LIBRE 3 READER) DEVI 1 Piece by Does not apply route once as needed for up to 1 dose. 1 each 0   Continuous Glucose Sensor (FREESTYLE LIBRE 3 SENSOR) MISC 1 Piece by Does not apply route every 14 (fourteen) days. Place 1 sensor on the skin every 14 days. Use to check glucose continuously 2 each 2   glipiZIDE (GLUCOTROL XL) 5 MG 24 hr tablet Take 1 tablet (5 mg total) by mouth daily with breakfast. 90 tablet 1   glucose blood (TRUE  METRIX BLOOD GLUCOSE TEST) test strip Use as instructed 100 each 12   Insulin Glargine (BASAGLAR KWIKPEN) 100 UNIT/ML Inject 40 Units into the skin at bedtime. 45 mL 0   Insulin Pen Needle (RELION PEN NEEDLE 31G/8MM) 31G X 8 MM MISC USE AS DIRECTED 100 each 0   lisinopril (ZESTRIL) 5 MG tablet Take 1 tablet (5 mg total) by mouth daily. 90 tablet 0   metFORMIN (GLUCOPHAGE) 1000 MG tablet Take 1 tablet (1,000 mg total) by mouth 2 (two) times daily with a meal. 180 tablet 0   rosuvastatin (CRESTOR) 20 MG tablet Take 1 tablet (20 mg total)  by mouth daily. 90 tablet 0   triamcinolone ointment (KENALOG) 0.5 % Apply 1 application. topically 2 (two) times daily. 60 g 1   venlafaxine (EFFEXOR) 75 MG tablet Take 1 tablet (75 mg total) by mouth 2 (two) times daily with a meal. 120 tablet 2   No current facility-administered medications for this visit.     Musculoskeletal: Strength & Muscle Tone: within normal limits and Telehealth visit Gait & Station: normal, Telehealth visit Patient leans: N/A  Psychiatric Specialty Exam: Review of Systems  There were no vitals taken for this visit.There is no height or weight on file to calculate BMI.  General Appearance: Well Groomed  Eye Contact:  Good  Speech:  Clear and Coherent and Normal Rate  Volume:  Normal  Mood:  Euthymic  Affect:  Appropriate and Congruent  Thought Process:  Coherent, Goal Directed, and NA  Orientation:  Full (Time, Place, and Person)  Thought Content: WDL and Logical   Suicidal Thoughts:  No  Homicidal Thoughts:  No  Memory:  Immediate;   Good Recent;   Good Remote;   Good  Judgement:  Good  Insight:  Good  Psychomotor Activity:  Normal  Concentration:  Concentration: Good and Attention Span: Good  Recall:  Good  Fund of Knowledge: Good  Language: Good  Akathisia:  No  Handed:  Right  AIMS (if indicated): not done  Assets:  Communication Skills Desire for Improvement Financial Resources/Insurance Housing Physical  Health Social Support  ADL's:  Intact  Cognition: WNL  Sleep:  Good   Screenings: GAD-7    Flowsheet Row Video Visit from 10/03/2023 in [facility] Video Visit from 06/22/2023 in [facility] Video Visit from 04/20/2023 in [facility] Video Visit from 02/07/2023 in [facility] Counselor from 10/03/2022 in [facility]  Total GAD-7 Score 0 0 0 0 0      PHQ2-9    Flowsheet Row Video Visit from 10/03/2023 in [facility] Video Visit from 06/22/2023 in [facility] Video Visit from 04/20/2023 in [facility] Office Visit from 03/28/2023 in [facility] Video Visit from 02/07/2023 in [facility]  PHQ-2 Total Score 0 0 0 0 0  PHQ-9 Total Score 1 1 1  0 0      Flowsheet Row ED from 10/26/2022 in [facility] Emergency Department at [facility] Most recent reading at 10/26/2022  6:13 PM Counselor from 10/03/2022 in [facility] Most recent reading at 10/03/2022 10:25 AM Counselor from 10/03/2022 in [facility] Most recent reading at 10/03/2022  8:16 AM  C-SSRS RISK CATEGORY No Risk No Risk No Risk        Assessment and Plan: Patient notes that he has been having issues walking due to pain.  He does note that mentally he is doing well.  No medication changes made today.  Patient agreeable to continue medication as prescribed.  1. Recurrent major depressive disorder, in partial remission (HCC)  Continue- venlafaxine (EFFEXOR) 75 MG tablet; Take 1 tablet (75 mg total) by mouth 2 (two) times daily with a meal.  Dispense: 120 tablet; Refill: 2    Collaboration of Care: Collaboration of Care: Other provider involved in patient's care AEB  PCP  Patient/Guardian was advised Release of Information must be obtained prior to any record release in  order to collaborate their care with an outside provider. Patient/Guardian was advised if they have not already done so to contact the registration department to sign all necessary forms in order for [place] to release information regarding their care.   Consent: Patient/Guardian gives verbal consent for treatment and assignment of benefits for services provided during this visit. Patient/Guardian expressed understanding and agreed to proceed.   Follow-up in 3 months Shanna Cisco, NP 10/03/2023, 3:02 PM

## 2023-10-06 ENCOUNTER — Other Ambulatory Visit: Payer: Self-pay | Admitting: Family

## 2023-10-06 DIAGNOSIS — E782 Mixed hyperlipidemia: Secondary | ICD-10-CM

## 2023-10-12 ENCOUNTER — Other Ambulatory Visit: Payer: Self-pay | Admitting: Family

## 2023-10-12 DIAGNOSIS — E782 Mixed hyperlipidemia: Secondary | ICD-10-CM
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
## 2023-10-13 LAB — COLOGUARD

## 2023-10-15 ENCOUNTER — Other Ambulatory Visit: Payer: Self-pay | Admitting: Family

## 2023-10-15 ENCOUNTER — Telehealth: Payer: Self-pay | Admitting: Family

## 2023-10-15 ENCOUNTER — Ambulatory Visit: Payer: Self-pay | Admitting: *Deleted

## 2023-10-15 DIAGNOSIS — Z1211 Encounter for screening for malignant neoplasm of colon: Secondary | ICD-10-CM

## 2023-10-15 DIAGNOSIS — E782 Mixed hyperlipidemia: Secondary | ICD-10-CM

## 2023-10-15 NOTE — Telephone Encounter (Signed)
Medication Refill - Medication: metformin 1000mg  Denied was prescribed by his endocro nologist. He says he can't get thru to them for the refill  Has the patient contacted their pharmacy? yes ((Agent: If yes, when and what did the pharmacy advise?)contact pcp  Preferred Pharmacy (with phone number or street name): [facility] [address] [place]), [place] - [id number] Phone: [phone]  Fax: [phone]   Has the patient been seen for an appointment in the last year OR does the patient have an upcoming appointment? yes  Agent: Please be advised that RX refills may take up to 3 business days. We ask that you follow-up with your pharmacy.

## 2023-10-15 NOTE — Telephone Encounter (Signed)
  Chief Complaint: elevated blood glucose out of medication Symptoms: out of metformin couple of weeks and could not get endocrinologist to refill. Has not been taking basaglar insulin due to cost and unable to pay for insulin. Now has blurred vision, urinary frequency, thirst. Blood glucose now 389 and has been 400 - 550.  Frequency: couple of weeks  Pertinent Negatives: Patient denies difficulty breathing no other sx  Disposition: [] ED /[] Urgent Care (no appt availability in office) / [] Appointment(In office/virtual)/ []  [place] [facility]/ [] Home Care/ [x] Refused Recommended Disposition /[] [place] [facility]/ []  Follow-up with PCP Additional Notes:   Recommended UC / ED . Please advise regarding medication refills. Patient has issues financially getting insulin . Patient declined UC/ED and would like PCP to refill metformin. Needs financial assistance. Please advise.     Reason for Disposition  [1] Blood glucose > 300 mg/dL (16.1 mmol/L) AND [0] two or more times in a row  Answer Assessment - Initial Assessment Questions 1. BLOOD GLUCOSE: "What is your blood glucose level?"      389 2. ONSET: "When did you check the blood glucose?"     now 3. USUAL RANGE: "What is your glucose level usually?" (e.g., usual fasting morning value, usual evening value)     Na  4. KETONES: "Do you check for ketones (urine or blood test strips)?" If Yes, ask: "What does the test show now?"      na 5. TYPE 1 or 2:  "Do you know what type of diabetes you have?"  (e.g., Type 1, Type 2, Gestational; doesn't know)      na 6. INSULIN: "Do you take insulin?" "What type of insulin(s) do you use? What is the mode of delivery? (syringe, pen; injection or pump)?"      Not taken in couple of weeks due to financial issues 7. DIABETES PILLS: "Do you take any pills for your diabetes?" If Yes, ask: "Have you missed taking any pills recently?"     Yes glipizide and metformin. Has been out of metformin couple of  weeks  8. OTHER SYMPTOMS: "Do you have any symptoms?" (e.g., fever, frequent urination, difficulty breathing, dizziness, weakness, vomiting)     Frequent urination, blurred vision. Denies other sx 9. PREGNANCY: "Is there any chance you are pregnant?" "When was your last menstrual period?"     na  Protocols used: Diabetes - High Blood Sugar-A-AH

## 2023-10-15 NOTE — Telephone Encounter (Signed)
Pt called in states the insulin, Insulin Glargine (BASAGLAR KWIKPEN) 100 UNIT/ML  is too expensive for him with only part B medicare he has. It is 400.00 Please cb with other alternatives.

## 2023-10-16 ENCOUNTER — Other Ambulatory Visit: Payer: Self-pay | Admitting: Family

## 2023-10-16 DIAGNOSIS — E1165 Type 2 diabetes mellitus with hyperglycemia: Secondary | ICD-10-CM

## 2023-10-16 MED ORDER — METFORMIN HCL 1000 MG PO TABS: 1000.0000 mg | ORAL_TABLET | Freq: Two times a day (BID) | ORAL | 0 refills | Status: DC

## 2023-10-16 NOTE — Telephone Encounter (Signed)
Left voice mail to call office back to go over what he called about yesterday.

## 2023-10-16 NOTE — Telephone Encounter (Signed)
Please refer to my reply from 10/16/2023 at 9:37 am.

## 2023-10-16 NOTE — Telephone Encounter (Signed)
-   Metformin prescribed.  - Patient established with [facility]. Please call the same for advisement of insulin-related costs to see if alternate may be able to be prescribed or possible financial assistance can be offered to patient.  - Reports to Emergency Department/Urgent Care/call 911 for immediate medical evaluation.
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
## 2023-10-16 NOTE — Telephone Encounter (Signed)
I've tried to contact him twice now and the calls keep getting forwarded.

## 2023-10-19 ENCOUNTER — Other Ambulatory Visit: Payer: Self-pay

## 2023-10-19 DIAGNOSIS — Z794 Long term (current) use of insulin: Secondary | ICD-10-CM

## 2023-10-19 MED ORDER — METFORMIN HCL 1000 MG PO TABS: 1000.0000 mg | ORAL_TABLET | Freq: Two times a day (BID) | ORAL | 0 refills | Status: DC

## 2023-10-19 NOTE — Telephone Encounter (Signed)
Metformin refill did not go through 10/16/23. Cassandra in the practice sent it.

## 2023-10-22 ENCOUNTER — Telehealth: Payer: Self-pay | Admitting: Family

## 2023-10-22 NOTE — Telephone Encounter (Signed)
I have attempted without success to contact this patient by phone to return their call and I left a message on answering machine.

## 2023-10-22 NOTE — Telephone Encounter (Signed)
Pt is calling in to let Amy know that his glucose levels are still high and he wants to know can he try another medication like Ozempic. Pt says the current medication isn't lowering his glucose the way it needs to be.

## 2023-10-23 NOTE — Telephone Encounter (Signed)
Report to the Emergency Department/Urgent Care/call 911 for immediate medical evaluation. Also, patient established with Endocrinology. Please consult with the same for advisement of next steps/follow-up.

## 2023-10-23 NOTE — Telephone Encounter (Signed)
I have attempted without success to contact this patient by phone to return their call and I left a message on answering machine.

## 2023-10-29 ENCOUNTER — Other Ambulatory Visit: Payer: Self-pay

## 2023-10-29 ENCOUNTER — Ambulatory Visit (INDEPENDENT_AMBULATORY_CARE_PROVIDER_SITE_OTHER): Payer: Self-pay | Admitting: Family

## 2023-10-29 ENCOUNTER — Encounter: Payer: Self-pay | Admitting: Family

## 2023-10-29 VITALS — BP 117/77 | HR 71 | Temp 98.4°F | Ht 71.5 in | Wt 195.8 lb

## 2023-10-29 DIAGNOSIS — E1165 Type 2 diabetes mellitus with hyperglycemia: Secondary | ICD-10-CM

## 2023-10-29 DIAGNOSIS — Z794 Long term (current) use of insulin: Secondary | ICD-10-CM

## 2023-10-29 MED ORDER — RELION PEN NEEDLES 31G X 8 MM MISC
1.0000 | Freq: Every day | 0 refills | Status: DC
  Filled 2023-10-29 – 2023-11-14 (×2): qty 100, 100d supply, fill #0

## 2023-10-29 MED ORDER — BASAGLAR KWIKPEN 100 UNIT/ML ~~LOC~~ SOPN
40.0000 [IU] | PEN_INJECTOR | Freq: Every day | SUBCUTANEOUS | 0 refills | Status: DC
  Filled 2023-10-29 – 2023-11-14 (×2): qty 12, 30d supply, fill #0
  Filled 2023-12-13: qty 12, 30d supply, fill #1

## 2023-10-29 NOTE — Progress Notes (Signed)
Patient states only concern to talk about is his diabetes.

## 2023-10-29 NOTE — Progress Notes (Signed)
Patient ID: Alan Reeves, male    DOB: 1956/02/14  MRN: [id number]  CC: Follow-Up  Subjective: Alan Reeves is a 67 y.o. male who presents for follow-up.   His concerns today include:  08/24/2023 [facility] per MD note: Assessment & Plan:    1. Type 2 diabetes mellitus with hyperglycemia, with long-term current use of insulin (HCC)   - Alan Reeves has currently uncontrolled symptomatic type 2 DM since  67 years of age.   He presents with worsening glycemic profile and point-of-care A1c of 9% increasing from 8.5%.  He did not document any hypoglycemia.      Recent labs reviewed. - I had a long discussion with him about the possible risk factors and  the pathology behind its diabetes and its complications. -his diabetes is complicated by CKD, comorbid hyperlipidemia, high BMI, sedentary life and he remains at a high risk for more acute and chronic complications which include CAD, CVA, CKD, retinopathy, and neuropathy. These are all discussed in detail with him.   - I discussed all available options of managing his diabetes including de-escalation of medications. I have counseled him on Food as Medicine by adopting a Whole Food , Plant Predominant  ( WFPP) nutrition as recommended by [employer] of Lifestyle Medicine. Patient is encouraged to switch to  unprocessed or minimally processed  complex starch, adequate protein intake (mainly plant source), minimal liquid fat, plenty of fruits, and vegetables. -  he is advised to stick to a routine mealtimes to eat 3 complete meals a day and snack only when necessary ( to snack only to correct hypoglycemia BG <70 day time or <100 at night).    - he acknowledges that there is a room for improvement in his food and drink choices. - Suggestion is made for him to avoid simple carbohydrates  from his diet including Cakes, Sweet Desserts, Ice Cream, Soda (diet and regular), Sweet Tea, Candies, Chips,  Cookies, Store Bought Juices, Alcohol , Artificial Sweeteners,  Coffee Creamer, and "Sugar-free" Products, Lemonade. This will help patient to have more stable blood glucose profile and potentially avoid unintended weight gain.   The following Lifestyle Medicine recommendations according to American College of Lifestyle Medicine  [facility]) were discussed and and offered to patient and he  agrees to start the journey:  A. Whole Foods, Plant-Based Nutrition comprising of fruits and vegetables, plant-based proteins, whole-grain carbohydrates was discussed in detail with the patient.   A list for source of those nutrients were also provided to the patient.  Patient will use only water or unsweetened tea for hydration. B.  The need to stay away from risky substances including alcohol, smoking; obtaining 7 to 9 hours of restorative sleep, at least 150 minutes of moderate intensity exercise weekly, the importance of healthy social connections,  and stress management techniques were discussed. C.  A full color page of  Calorie density of various food groups per pound showing examples of each food groups was provided to the patient.     - he will be scheduled with Norm Salt, RDN, CDE for individualized diabetes education.   - I have approached him with the following individualized plan to manage  his diabetes and patient agrees:    -In light of his presentation with loss of control, he will need a higher dose of insulin.  Accordingly, I advised him to increase Basaglar to 40 units nightly, advised to continue metformin 1000 mg p.o. twice  daily.  I discussed and added Jardiance 10 mg p.o. daily at breakfast.  Side effects and precautions discussed with him.     -This patient did not get coverage for a CGM device.     -He is advised to continue monitor blood glucose at least twice a day-daily before breakfast and at bedtime.       - Specific targets for  A1c;  LDL, HDL,  and Triglycerides were discussed  with the patient.   2) Blood Pressure /Hypertension:   His blood pressure was controlled to target.    he is advised to continue his current medications including amlodipine 5 mg p.o. daily at breakfast, lisinopril 5 mg p.o. daily at breakfast.     3) Lipids/Hyperlipidemia:   Review of his recent lipid panel showed  controlled  LDL at 20 .  he  is advised to continue Crestor 20 mg p.o. daily at bedtime.  Side effects and precautions discussed with him.       4)  Weight/Diet:  Body mass index is 28.17 kg/m.  -   clearly complicating his diabetes care.   he is  a candidate for weight loss. I discussed with him the fact that loss of 5 - 10% of his  current body weight will have the most impact on his diabetes management.  The above detailed  ACLM recommendations for nutrition, exercise, sleep, social life, avoidance of risky substances, the need for restorative sleep   information will also detailed on discharge instructions.   5) Chronic Care/Health Maintenance:   -he  is on ACEI/ARB and Statin medications and  is encouraged to initiate and continue to follow up with Ophthalmology, Dentist,  Podiatrist at least yearly or according to recommendations, and advised to   stay away from smoking. I have recommended yearly flu vaccine and pneumonia vaccine at least every 5 years; moderate intensity exercise for up to 150 minutes weekly; and  sleep for 7- 9 hours a day.   - he is  advised to maintain close follow up with Rema Fendt, NP for primary care needs, as well as his other providers for optimal and coordinated care.     I spent  41  minutes in the care of the patient today including review of labs from CMP, Lipids, Thyroid Function, Hematology (current and previous including abstractions from other facilities); face-to-face time discussing  his blood glucose readings/logs, discussing hypoglycemia and hyperglycemia episodes and symptoms, medications doses, his options of short and long term  treatment based on the latest standards of care / guidelines;  discussion about incorporating lifestyle medicine;  and documenting the encounter. Risk reduction counseling performed per USPSTF guidelines to reduce  cardiovascular risk factors.      Please refer to Patient Instructions for Blood Glucose Monitoring and Insulin/Medications Dosing Guide"  in media tab for additional information. Please  also refer to " Patient Self Inventory" in the Media  tab for reviewed elements of pertinent patient history.   [name] participated in the discussions, expressed understanding, and voiced agreement with the above plans.  All questions were answered to his satisfaction. he is encouraged to contact clinic should he have any questions or concerns prior to his return visit.    Follow up plan: - Return in about 3 months (around 11/23/2023) for Bring Meter/CGM Device/Logs- A1c in Office.  Today's office visit 10/29/2023: Reports he has been unable to schedule an appointment with Endocrinology. States he has not taken insulin in 5  weeks due to health insurance requiring him to pay significant amount out of pocket. He requests refills of insulin on today. Reports he is doing well on remaining diabetes regimen. Home blood sugars in the 200's - 300's. Reports has noticed blurry vision bilaterally and declines referral to Ophthalmology. He denies red flag symptoms associated with diabetes.    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   Type 2 diabetes mellitus without complication, with long-term current use of insulin (HCC) 10/08/2017   Keratoconjunctivitis sicca of both eyes not specified as Sjogren's 10/08/2017   Long term current use of oral hypoglycemic drug 10/08/2017   Nuclear sclerotic  cataract of both eyes 10/08/2017   Presbyopia of both eyes 10/08/2017   Vitreous floaters, bilateral 10/08/2017   Benign paroxysmal positional vertigo 11/15/2016   Impacted cerumen of left ear 11/15/2016   Needs flu shot 11/15/2016   Hyperlipemia 07/07/2016   Overweight (BMI 25.0-29.9) 07/07/2016   Essential hypertension 04/05/2016   Type 2 diabetes mellitus with hyperglycemia, with long-term current use of insulin (HCC) 04/05/2016   Shoulder impingement syndrome 09/24/2014     Current Outpatient Medications on File Prior to Visit  Medication Sig Dispense Refill   amLODipine (NORVASC) 5 MG tablet Take 1 tablet (5 mg total) by mouth daily. 90 tablet 0   Continuous Glucose Receiver (FREESTYLE LIBRE 3 READER) DEVI 1 Piece by Does not apply route once as needed for up to 1 dose. 1 each 0   Continuous Glucose Sensor (FREESTYLE LIBRE 3 SENSOR) MISC 1 Piece by Does not apply route every 14 (fourteen) days. Place 1 sensor on the skin every 14 days. Use to check glucose continuously 2 each 2   glipiZIDE (GLUCOTROL XL) 5 MG 24 hr tablet Take 1 tablet (5 mg total) by mouth daily with breakfast. 90 tablet 1   glucose blood (TRUE METRIX BLOOD GLUCOSE TEST) test strip Use as instructed 100 each 12   lisinopril (ZESTRIL) 5 MG tablet Take 1 tablet (5 mg total) by mouth daily. 90 tablet 0   metFORMIN (GLUCOPHAGE) 1000 MG tablet Take 1 tablet (1,000 mg total) by mouth 2 (two) times daily with a meal. 180 tablet 0   rosuvastatin (CRESTOR) 20 MG tablet Take 1 tablet (20 mg total) by mouth daily. 90 tablet 0   venlafaxine (EFFEXOR) 75 MG tablet Take 1 tablet (75 mg total) by mouth 2 (two) times daily with a meal. 120 tablet 2   triamcinolone ointment (KENALOG) 0.5 % Apply 1 application. topically 2 (two) times daily. (Patient not taking: Reported on 10/29/2023) 60 g 1   No current facility-administered medications on file prior to visit.    No Known Allergies  Social History   Socioeconomic History    Marital status: Divorced    Spouse name: Not on file   Number of children: Not on file   Years of education: Not on file   Highest education level: Not on file  Occupational History   Not on file  Tobacco Use   Smoking status: Never    Passive exposure: Never   Smokeless tobacco: Never  Vaping Use   Vaping status: Never Used  Substance and Sexual Activity   Alcohol use: No   Drug use: No   Sexual activity: Not on file  Other Topics Concern  Not on file  Social History Narrative   Not on file   Social Determinants of Health   Financial Resource Strain: Low Risk  (10/03/2022)   Overall Financial Resource Strain (CARDIA)    Difficulty of Paying Living Expenses: Not hard at all  Food Insecurity: No Food Insecurity (10/03/2022)   Hunger Vital Sign    Worried About Running Out of Food in the Last Year: Never true    Ran Out of Food in the Last Year: Never true  Transportation Needs: No Transportation Needs (10/03/2022)   PRAPARE - Administrator, Civil Service (Medical): No    Lack of Transportation (Non-Medical): No  Physical Activity: Inactive (10/03/2022)   Exercise Vital Sign    Days of Exercise per Week: 0 days    Minutes of Exercise per Session: 0 min  Stress: No Stress Concern Present (10/03/2022)   [employer] of Occupational Health - Occupational Stress Questionnaire    Feeling of Stress : Not at all  Social Connections: Socially Isolated (10/03/2022)   Social Connection and Isolation Panel [NHANES]    Frequency of Communication with Friends and Family: More than three times a week    Frequency of Social Gatherings with Friends and Family: Once a week    Attends Religious Services: Never    Database administrator or Organizations: No    Attends Engineer, structural: Never    Marital Status: Divorced  Catering manager Violence: Not on file    Family History  Problem Relation Age of Onset   Cancer Mother    Diabetes Mother     Hypertension Mother    Hyperlipidemia Mother    Diabetes Father    Cancer Father    Stroke Father     Past Surgical History:  Procedure Laterality Date   COLONOSCOPY      ROS: Review of Systems Negative except as stated above  PHYSICAL EXAM: BP 117/77   Pulse 71   Temp 98.4 F (36.9 C) (Oral)   Ht 5' 11.5" (1.816 m)   Wt 195 lb 12.8 oz (88.8 kg)   SpO2 94%   BMI 26.93 kg/m   Physical Exam HENT:     Head: Normocephalic and atraumatic.     Nose: Nose normal.     Mouth/Throat:     Mouth: Mucous membranes are moist.     Pharynx: Oropharynx is clear.  Eyes:     Extraocular Movements: Extraocular movements intact.     Conjunctiva/sclera: Conjunctivae normal.     Pupils: Pupils are equal, round, and reactive to light.  Cardiovascular:     Rate and Rhythm: Normal rate and regular rhythm.     Pulses: Normal pulses.     Heart sounds: Normal heart sounds.  Pulmonary:     Effort: Pulmonary effort is normal.     Breath sounds: Normal breath sounds.  Musculoskeletal:        General: Normal range of motion.     Cervical back: Normal range of motion and neck supple.  Neurological:     General: No focal deficit present.     Mental Status: He is alert and oriented to person, place, and time.  Psychiatric:        Mood and Affect: Mood normal.        Behavior: Behavior normal.     ASSESSMENT AND PLAN: 1. Type 2 diabetes mellitus with hyperglycemia, with long-term current use of insulin (HCC) - Continue present management.  - Insulin  Glargine refilled per patient request. - Discussed the importance of healthy eating habits, low-carbohydrate diet, low-sugar diet, regular aerobic exercise (at least 150 minutes a week as tolerated) and medication compliance to achieve or maintain control of diabetes. - Keep all scheduled appointments with Endocrinology.  - Insulin Glargine (BASAGLAR KWIKPEN) 100 UNIT/ML; Inject 40 Units into the skin at bedtime.  Dispense: 45 mL; Refill: 0 -  Insulin Pen Needle (RELION PEN NEEDLE 31G/8MM) 31G X 8 MM MISC; 1 each by Other route at bedtime. USE AS DIRECTED  Dispense: 100 each; Refill: 0   Patient was given the opportunity to ask questions.  Patient verbalized understanding of the plan and was able to repeat key elements of the plan. Patient was given clear instructions to go to Emergency Department or return to medical center if symptoms don't improve, worsen, or new problems develop.The patient verbalized understanding.  Requested Prescriptions   Signed Prescriptions Disp Refills   Insulin Glargine (BASAGLAR KWIKPEN) 100 UNIT/ML 45 mL 0    Sig: Inject 40 Units into the skin at bedtime.   Insulin Pen Needle (RELION PEN NEEDLE 31G/8MM) 31G X 8 MM MISC 100 each 0    Sig: 1 each by Other route at bedtime. USE AS DIRECTED    Follow-up with primary provider as scheduled.   Rema Fendt, NP

## 2023-11-02 LAB — COLOGUARD: COLOGUARD: NEGATIVE

## 2023-11-05 ENCOUNTER — Encounter: Payer: Self-pay | Admitting: *Deleted

## 2023-11-07 ENCOUNTER — Other Ambulatory Visit: Payer: Self-pay

## 2023-11-14 ENCOUNTER — Other Ambulatory Visit: Payer: Self-pay

## 2023-11-21 ENCOUNTER — Other Ambulatory Visit: Payer: Self-pay | Admitting: Family

## 2023-11-21 DIAGNOSIS — I10 Essential (primary) hypertension: Secondary | ICD-10-CM

## 2023-11-21 NOTE — Telephone Encounter (Signed)
Complete

## 2023-11-22 ENCOUNTER — Other Ambulatory Visit: Payer: Self-pay | Admitting: Family

## 2023-11-22 DIAGNOSIS — E1165 Type 2 diabetes mellitus with hyperglycemia: Secondary | ICD-10-CM
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
## 2023-12-10 ENCOUNTER — Other Ambulatory Visit: Payer: Self-pay | Admitting: Family

## 2023-12-10 DIAGNOSIS — E1165 Type 2 diabetes mellitus with hyperglycemia: Secondary | ICD-10-CM

## 2023-12-10 NOTE — Telephone Encounter (Signed)
Medication Refill -  Most Recent Primary Care Visit:  Provider: Rema Fendt  Department: [facility]  Visit Type: OFFICE VISIT  Date: 10/29/2023  Medication: Rx #: [id number]  Insulin Glargine (BASAGLAR KWIKPEN) 100 UNIT/ML [[id number]]   Has the patient contacted their pharmacy? Yes (Agent: If no, request that the patient contact the pharmacy for the refill. If patient does not wish to contact the pharmacy document the reason why and proceed with request.) (Agent: If yes, when and what did the pharmacy advise?)  Is this the correct pharmacy for this prescription? Yes If no, delete pharmacy and type the correct one.  This is the patient's preferred pharmacy:    [facility] [address] [place] [address] Phone: [phone] Fax: [phone]   Has the prescription been filled recently? Yes  Is the patient out of the medication? Yes  Has the patient been seen for an appointment in the last year OR does the patient have an upcoming appointment? Yes  Can we respond through MyChart? Yes  Agent: Please be advised that Rx refills may take up to 3 business days. We ask that you follow-up with your pharmacy.

## 2023-12-13 ENCOUNTER — Other Ambulatory Visit: Payer: Self-pay

## 2023-12-14 ENCOUNTER — Other Ambulatory Visit: Payer: Self-pay

## 2023-12-14 MED ORDER — BASAGLAR KWIKPEN 100 UNIT/ML ~~LOC~~ SOPN
40.0000 [IU] | PEN_INJECTOR | Freq: Every day | SUBCUTANEOUS | 1 refills | Status: DC
  Filled 2023-12-14: qty 45, 112d supply, fill #0
  Filled 2024-01-23: qty 6, 15d supply, fill #0
  Filled 2024-02-07: qty 15, 37d supply, fill #1
  Filled 2024-03-21: qty 15, 37d supply, fill #2
  Filled 2024-05-02: qty 15, 37d supply, fill #3
  Filled 2024-06-27: qty 15, 37d supply, fill #4
  Filled 2024-08-08: qty 15, 37d supply, fill #5
  Filled 2024-10-02: qty 9, 22d supply, fill #6
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
## 2023-12-24 ENCOUNTER — Other Ambulatory Visit: Payer: Self-pay | Admitting: Family

## 2023-12-24 DIAGNOSIS — I10 Essential (primary) hypertension: Secondary | ICD-10-CM

## 2023-12-27 ENCOUNTER — Ambulatory Visit: Payer: Medicare Other | Admitting: "Endocrinology

## 2023-12-27 ENCOUNTER — Encounter (HOSPITAL_COMMUNITY): Payer: Self-pay | Admitting: Psychiatry

## 2023-12-27 ENCOUNTER — Telehealth (HOSPITAL_COMMUNITY): Payer: No Payment, Other | Admitting: Psychiatry

## 2023-12-27 DIAGNOSIS — F3341 Major depressive disorder, recurrent, in partial remission: Secondary | ICD-10-CM

## 2023-12-27 MED ORDER — VENLAFAXINE HCL 75 MG PO TABS: 75.0000 mg | ORAL_TABLET | Freq: Two times a day (BID) | ORAL | 2 refills | Status: DC

## 2023-12-27 NOTE — Progress Notes (Signed)
BH MD/PA/NP OP Progress Note Virtual Visit via Video Note  I connected with Alan Reeves on 12/27/23 at  2:00 PM EST by a video enabled telemedicine application and verified that I am speaking with the correct person using two identifiers.  Location: Patient: Home Provider: Clinic   I discussed the limitations of evaluation and management by telemedicine and the availability of in person appointments. The patient expressed understanding and agreed to proceed.  I provided 30 minutes of non-face-to-face time during this encounter.   12/27/2023 2:13 PM Alan Reeves  MRN:  [id number]  Chief Complaint: "This year is going to be great"  HPI:  68 year old male seen today for follow-up psychiatric evaluation. He has a psychiatric history of bipolar disorder, anxiety, and depression.  He is currently managed on Effexor 75 mg twice daily and reports that it is effective in managing his psychiatric conditions.  Today he is well-groomed, pleasant, cooperative, engaged in conversation, maintained eye contact.  He informed Clinical research associate that last year was a terrible year but he reports that that she is going to be great.  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  He also notes that he is looking forward to finding love.  Patient informed writer that his pain has subsided since his last visit.  He also notes that his mood is stable and reports that he has minimal anxiety and depression.  Today provider conducted a GAD-7 and patient scored a 0, at last visit he scored a 0.  Provider conducted PHQ-9 and patient scored a 1, at his last visit he scored a 1.  He endorses adequate sleep and appetite.  Patient notes that he continues to lose weight and reports that he has lost 10 pounds since his last visit.    No medication changes made today.  Patient agreeable to continue medication as prescribed.  No other concerns noted at this time.   Visit Diagnosis:    ICD-10-CM   1.  Recurrent major depressive disorder, in partial remission (HCC)  F33.41 venlafaxine (EFFEXOR) 75 MG tablet         Past Psychiatric History: bipolar disorder, anxiety, and depression  Past Medical History:  Past Medical History:  Diagnosis Date   Anxiety    Bipolar 1 disorder (HCC)    Depression    Diabetes mellitus without complication (HCC)    Hyperlipidemia    Hypertension    Kidney disease     Past Surgical History:  Procedure Laterality Date   COLONOSCOPY      Family Psychiatric History:  Maternal uncles alcohol use, Sister bipolar disorder, paternal aunts alcohol use   Family History:  Family History  Problem Relation Age of Onset   Cancer Mother    Diabetes Mother    Hypertension Mother    Hyperlipidemia Mother    Diabetes Father    Cancer Father    Stroke Father     Social History:  Social History   Socioeconomic History   Marital status: Divorced    Spouse name: Not on file   Number of children: Not on file   Years of education: Not on file   Highest education level: Not on file  Occupational History   Not on file  Tobacco Use   Smoking status: Never    Passive exposure: Never   Smokeless tobacco: Never  Vaping Use   Vaping status: Never Used  Substance and Sexual Activity   Alcohol use: No  Drug use: No   Sexual activity: Not on file  Other Topics Concern   Not on file  Social History Narrative   Not on file   Social Drivers of Health   Financial Resource Strain: Low Risk  (10/03/2022)   Overall Financial Resource Strain (CARDIA)    Difficulty of Paying Living Expenses: Not hard at all  Food Insecurity: No Food Insecurity (10/03/2022)   Hunger Vital Sign    Worried About Running Out of Food in the Last Year: Never true    Ran Out of Food in the Last Year: Never true  Transportation Needs: No Transportation Needs (10/03/2022)   PRAPARE - Administrator, Civil Service (Medical): No    Lack of Transportation  (Non-Medical): No  Physical Activity: Inactive (10/03/2022)   Exercise Vital Sign    Days of Exercise per Week: 0 days    Minutes of Exercise per Session: 0 min  Stress: No Stress Concern Present (10/03/2022)   [employer] of Occupational Health - Occupational Stress Questionnaire    Feeling of Stress : Not at all  Social Connections: Socially Isolated (10/03/2022)   Social Connection and Isolation Panel [NHANES]    Frequency of Communication with Friends and Family: More than three times a week    Frequency of Social Gatherings with Friends and Family: Once a week    Attends Religious Services: Never    Database administrator or Organizations: No    Attends Engineer, structural: Never    Marital Status: Divorced    Allergies: No Known Allergies  Metabolic Disorder Labs: Lab Results  Component Value Date   HGBA1C 9.0 (A) 08/24/2023   No results found for: "PROLACTIN" Lab Results  Component Value Date   CHOL 110 08/22/2023   TRIG 214 (H) 08/22/2023   HDL 31 (L) 08/22/2023   CHOLHDL 3.5 08/22/2023   VLDL 73 (H) 03/31/2014   LDLCALC 45 08/22/2023   LDLCALC 20 11/23/2021   Lab Results  Component Value Date   TSH 0.761 08/22/2023   TSH 0.831 02/18/2021    Therapeutic Level Labs: Lab Results  Component Value Date   LITHIUM 0.5 10/26/2021   LITHIUM 0.6 06/27/2021   No results found for: "VALPROATE" No results found for: "CBMZ"  Current Medications: Current Outpatient Medications  Medication Sig Dispense Refill   amLODipine (NORVASC) 5 MG tablet Take 1 tablet by mouth once daily 90 tablet 0   Continuous Glucose Receiver (FREESTYLE LIBRE 3 READER) DEVI 1 Piece by Does not apply route once as needed for up to 1 dose. 1 each 0   Continuous Glucose Sensor (FREESTYLE LIBRE 3 SENSOR) MISC 1 Piece by Does not apply route every 14 (fourteen) days. Place 1 sensor on the skin every 14 days. Use to check glucose continuously 2 each 2   glipiZIDE (GLUCOTROL XL) 5  MG 24 hr tablet Take 1 tablet (5 mg total) by mouth daily with breakfast. 90 tablet 1   glucose blood (TRUE METRIX BLOOD GLUCOSE TEST) test strip Use as instructed 100 each 12   Insulin Glargine (BASAGLAR KWIKPEN) 100 UNIT/ML Inject 40 Units into the skin at bedtime. 45 mL 1   Insulin Pen Needle (RELION PEN NEEDLE 31G/8MM) 31G X 8 MM MISC Use at bedtime. USE AS DIRECTED 100 each 0   lisinopril (ZESTRIL) 5 MG tablet Take 1 tablet (5 mg total) by mouth daily. 90 tablet 0   metFORMIN (GLUCOPHAGE) 1000 MG tablet TAKE 1 TABLET BY MOUTH  TWICE DAILY WITH MEALS 180 tablet 0   rosuvastatin (CRESTOR) 20 MG tablet Take 1 tablet (20 mg total) by mouth daily. 90 tablet 0   triamcinolone ointment (KENALOG) 0.5 % Apply 1 application. topically 2 (two) times daily. (Patient not taking: Reported on 10/29/2023) 60 g 1   venlafaxine (EFFEXOR) 75 MG tablet Take 1 tablet (75 mg total) by mouth 2 (two) times daily with a meal. 120 tablet 2   No current facility-administered medications for this visit.     Musculoskeletal: Strength & Muscle Tone: within normal limits and Telehealth visit Gait & Station: normal, Telehealth visit Patient leans: N/A  Psychiatric Specialty Exam: Review of Systems  There were no vitals taken for this visit.There is no height or weight on file to calculate BMI.  General Appearance: Well Groomed  Eye Contact:  Good  Speech:  Clear and Coherent and Normal Rate  Volume:  Normal  Mood:  Euthymic  Affect:  Appropriate and Congruent  Thought Process:  Coherent, Goal Directed, and NA  Orientation:  Full (Time, Place, and Person)  Thought Content: WDL and Logical   Suicidal Thoughts:  No  Homicidal Thoughts:  No  Memory:  Immediate;   Good Recent;   Good Remote;   Good  Judgement:  Good  Insight:  Good  Psychomotor Activity:  Normal  Concentration:  Concentration: Good and Attention Span: Good  Recall:  Good  Fund of Knowledge: Good  Language: Good  Akathisia:  No  Handed:   Right  AIMS (if indicated): not done  Assets:  Communication Skills Desire for Improvement Financial Resources/Insurance Housing Physical Health Social Support  ADL's:  Intact  Cognition: WNL  Sleep:  Good   Screenings: GAD-7    Flowsheet Row Video Visit from 12/27/2023 in [facility] Video Visit from 10/03/2023 in [facility] Video Visit from 06/22/2023 in [facility] Video Visit from 04/20/2023 in [facility] Video Visit from 02/07/2023 in [facility]  Total GAD-7 Score 0 0 0 0 0      PHQ2-9    Flowsheet Row Video Visit from 12/27/2023 in [facility] Video Visit from 10/03/2023 in [facility] Video Visit from 06/22/2023 in [facility] Video Visit from 04/20/2023 in [facility] Office Visit from 03/28/2023 in [facility]  PHQ-2 Total Score 0 0 0 0 0  PHQ-9 Total Score 1 1 1 1  0      Flowsheet Row ED from 10/26/2022 in [facility] Emergency Department at [facility] Most recent reading at 10/26/2022  6:13 PM Counselor from 10/03/2022 in [facility] Most recent reading at 10/03/2022 10:25 AM [facility] Most recent reading at 10/03/2022  8:16 AM  C-SSRS RISK CATEGORY No Risk No Risk No Risk        Assessment and Plan: Patient notes that he is doing well.  No medication changes made today.  Patient agreeable to continue medication as prescribed.  1. Recurrent major depressive disorder, in partial remission (HCC)  Continue- venlafaxine (EFFEXOR) 75 MG tablet; Take 1 tablet (75 mg total) by mouth 2 (two) times daily with a meal.  Dispense: 120 tablet; Refill: 2    Collaboration of Care: Collaboration of  Care: Other provider involved in patient's care AEB PCP  Patient/Guardian was advised  Release of Information must be obtained prior to any record release in order to collaborate their care with an outside provider. Patient/Guardian was advised if they have not already done so to contact the registration department to sign all necessary forms in order for [place] to release information regarding their care.   Consent: Patient/Guardian gives verbal consent for treatment and assignment of benefits for services provided during this visit. Patient/Guardian expressed understanding and agreed to proceed.   Follow-up in 2.5 months Shanna Cisco, NP 12/27/2023, 2:13 PM

## 2024-01-10 ENCOUNTER — Other Ambulatory Visit: Payer: Self-pay | Admitting: Family

## 2024-01-10 DIAGNOSIS — I10 Essential (primary) hypertension: Secondary | ICD-10-CM

## 2024-01-10 MED ORDER — LISINOPRIL 5 MG PO TABS: 5.0000 mg | ORAL_TABLET | Freq: Every day | ORAL | 0 refills | Status: DC

## 2024-01-10 NOTE — Telephone Encounter (Signed)
Copied from CRM [id number]. Topic: Clinical - Medication Refill >> Jan 10, 2024  1:57 PM Phill Myron wrote: Most Recent Primary Care Visit:  Provider: Rema Fendt  Department: [facility]  Visit Type: OFFICE VISIT  Date: 10/29/2023 lisinopril (ZESTRIL) 5 MG tablet Medication:   Has the patient contacted their pharmacy? Yes (Agent: If no, request that the patient contact the pharmacy for the refill. If patient does not wish to contact the pharmacy document the reason why and proceed with request.) (Agent: If yes, when and what did the pharmacy advise?)  Is this the correct pharmacy for this prescription? Yes If no, delete pharmacy and type the correct one.  This is the patient's preferred pharmacy:  [facility] [address] ([address]), [place] - [address] [place] ([place]) [place] [address] Phone: [phone] Fax: [phone]     Is the patient out of the medication? Yes  Has the patient been seen for an appointment in the last year OR does the patient have an upcoming appointment? Yes  Can we respond through MyChart? no  Agent: Please be advised that Rx refills may take up to 3 business days. We ask that you follow-up with your pharmacy.

## 2024-01-23 ENCOUNTER — Other Ambulatory Visit: Payer: Self-pay

## 2024-02-07 ENCOUNTER — Other Ambulatory Visit: Payer: Self-pay | Admitting: Family

## 2024-02-07 ENCOUNTER — Other Ambulatory Visit: Payer: Self-pay

## 2024-02-07 DIAGNOSIS — E1165 Type 2 diabetes mellitus with hyperglycemia: Secondary | ICD-10-CM

## 2024-02-07 MED ORDER — TRUEPLUS 5-BEVEL PEN NEEDLES 31G X 8 MM MISC
1.0000 | Freq: Every day | 1 refills | Status: DC
Start: 2024-02-07 — End: 2024-10-02
  Filled 2024-02-07: qty 100, 100d supply, fill #0
  Filled 2024-03-21: qty 100, 90d supply, fill #1

## 2024-02-07 NOTE — Telephone Encounter (Signed)
 Complete

## 2024-03-05 ENCOUNTER — Encounter (HOSPITAL_COMMUNITY): Payer: Self-pay | Admitting: Psychiatry

## 2024-03-05 ENCOUNTER — Telehealth (HOSPITAL_COMMUNITY): Admitting: Psychiatry

## 2024-03-05 ENCOUNTER — Encounter (HOSPITAL_COMMUNITY): Payer: Self-pay

## 2024-03-05 ENCOUNTER — Telehealth (HOSPITAL_COMMUNITY): Payer: Medicare Other | Admitting: Psychiatry

## 2024-03-05 DIAGNOSIS — F3341 Major depressive disorder, recurrent, in partial remission: Secondary | ICD-10-CM

## 2024-03-05 MED ORDER — VENLAFAXINE HCL 75 MG PO TABS: 75.0000 mg | ORAL_TABLET | Freq: Two times a day (BID) | ORAL | 2 refills | Status: DC

## 2024-03-05 NOTE — Progress Notes (Signed)
 BH MD/PA/NP OP Progress Note Virtual Visit via Video Note  I connected with Alan Reeves on 03/05/24 at  4:00 PM EDT by a video enabled telemedicine application and verified that I am speaking with the correct person using two identifiers.  Location: Patient: Home Provider: Clinic   I discussed the limitations of evaluation and management by telemedicine and the availability of in person appointments. The patient expressed understanding and agreed to proceed.  I provided 30 minutes of non-face-to-face time during this encounter.   03/05/2024 3:55 PM Alan Reeves  MRN:  [id number]  Chief Complaint: "Im doing pretty good"  HPI:  68 year old male seen today for follow-up psychiatric evaluation. He has a psychiatric history of bipolar disorder, anxiety, and depression.  He is currently managed on Effexor 75 mg twice daily and reports that it is effective in managing his psychiatric conditions.  Today he is well-groomed, pleasant, cooperative, engaged in conversation, maintained eye contact.  He informed Clinical research associate that he is which he but excited about.  Patient notes that his mood is stable and reports that he is minimal anxiety and depression.  Today provider conducted a GAD-7 and patient scored a 0, at his last visit he scored a 0.   Provider conducted PHQ-9 and patient scored a 1, at his last visit he scored a 1.  He endorses adequate sleep and appetite.    Patient reports that his daughter is pregnant.  He reports that he is excited for her as she was younger when she had her first 2 children.  He notes that he is older grandchildren giving his daughter some issues.  He reports that he is planning to speak with them to address respecting their mother more.  Overall he notes that he is doing well.  No medication changes made today.  Patient agreeable to continue medication as prescribed.  No other concerns noted at this time.   Visit Diagnosis:    ICD-10-CM   1. Recurrent major  depressive disorder, in partial remission (HCC)  F33.41 venlafaxine (EFFEXOR) 75 MG tablet          Past Psychiatric History: bipolar disorder, anxiety, and depression  Past Medical History:  Past Medical History:  Diagnosis Date   Anxiety    Bipolar 1 disorder (HCC)    Depression    Diabetes mellitus without complication (HCC)    Hyperlipidemia    Hypertension    Kidney disease     Past Surgical History:  Procedure Laterality Date   COLONOSCOPY      Family Psychiatric History:  Maternal uncles alcohol use, Sister bipolar disorder, paternal aunts alcohol use   Family History:  Family History  Problem Relation Age of Onset   Cancer Mother    Diabetes Mother    Hypertension Mother    Hyperlipidemia Mother    Diabetes Father    Cancer Father    Stroke Father     Social History:  Social History   Socioeconomic History   Marital status: Divorced    Spouse name: Not on file   Number of children: Not on file   Years of education: Not on file   Highest education level: Not on file  Occupational History   Not on file  Tobacco Use   Smoking status: Never    Passive exposure: Never   Smokeless tobacco: Never  Vaping Use   Vaping status: Never Used  Substance and Sexual Activity   Alcohol use: No   Drug use: No  Sexual activity: Not on file  Other Topics Concern   Not on file  Social History Narrative   Not on file   Social Drivers of Health   Financial Resource Strain: Low Risk  (10/03/2022)   Overall Financial Resource Strain (CARDIA)    Difficulty of Paying Living Expenses: Not hard at all  Food Insecurity: No Food Insecurity (10/03/2022)   Hunger Vital Sign    Worried About Running Out of Food in the Last Year: Never true    Ran Out of Food in the Last Year: Never true  Transportation Needs: No Transportation Needs (10/03/2022)   PRAPARE - Administrator, Civil Service (Medical): No    Lack of Transportation (Non-Medical): No   Physical Activity: Inactive (10/03/2022)   Exercise Vital Sign    Days of Exercise per Week: 0 days    Minutes of Exercise per Session: 0 min  Stress: No Stress Concern Present (10/03/2022)   [employer] of Occupational Health - Occupational Stress Questionnaire    Feeling of Stress : Not at all  Social Connections: Socially Isolated (10/03/2022)   Social Connection and Isolation Panel [NHANES]    Frequency of Communication with Friends and Family: More than three times a week    Frequency of Social Gatherings with Friends and Family: Once a week    Attends Religious Services: Never    Database administrator or Organizations: No    Attends Engineer, structural: Never    Marital Status: Divorced    Allergies: No Known Allergies  Metabolic Disorder Labs: Lab Results  Component Value Date   HGBA1C 9.0 (A) 08/24/2023   No results found for: "PROLACTIN" Lab Results  Component Value Date   CHOL 110 08/22/2023   TRIG 214 (H) 08/22/2023   HDL 31 (L) 08/22/2023   CHOLHDL 3.5 08/22/2023   VLDL 73 (H) 03/31/2014   LDLCALC 45 08/22/2023   LDLCALC 20 11/23/2021   Lab Results  Component Value Date   TSH 0.761 08/22/2023   TSH 0.831 02/18/2021    Therapeutic Level Labs: Lab Results  Component Value Date   LITHIUM 0.5 10/26/2021   LITHIUM 0.6 06/27/2021   No results found for: "VALPROATE" No results found for: "CBMZ"  Current Medications: Current Outpatient Medications  Medication Sig Dispense Refill   amLODipine (NORVASC) 5 MG tablet Take 1 tablet by mouth once daily 90 tablet 0   Continuous Glucose Receiver (FREESTYLE LIBRE 3 READER) DEVI 1 Piece by Does not apply route once as needed for up to 1 dose. 1 each 0   Continuous Glucose Sensor (FREESTYLE LIBRE 3 SENSOR) MISC 1 Piece by Does not apply route every 14 (fourteen) days. Place 1 sensor on the skin every 14 days. Use to check glucose continuously 2 each 2   glipiZIDE (GLUCOTROL XL) 5 MG 24 hr tablet  Take 1 tablet (5 mg total) by mouth daily with breakfast. 90 tablet 1   glucose blood (TRUE METRIX BLOOD GLUCOSE TEST) test strip Use as instructed 100 each 12   Insulin Glargine (BASAGLAR KWIKPEN) 100 UNIT/ML Inject 40 Units into the skin at bedtime. 45 mL 1   Insulin Pen Needle (TRUEPLUS 5-BEVEL PEN NEEDLES) 31G X 8 MM MISC Use at bedtime. USE AS DIRECTED 100 each 1   lisinopril (ZESTRIL) 5 MG tablet Take 1 tablet (5 mg total) by mouth daily. 90 tablet 0   metFORMIN (GLUCOPHAGE) 1000 MG tablet TAKE 1 TABLET BY MOUTH TWICE DAILY WITH MEALS 180  tablet 0   rosuvastatin (CRESTOR) 20 MG tablet Take 1 tablet (20 mg total) by mouth daily. 90 tablet 0   triamcinolone ointment (KENALOG) 0.5 % Apply 1 application. topically 2 (two) times daily. (Patient not taking: Reported on 10/29/2023) 60 g 1   venlafaxine (EFFEXOR) 75 MG tablet Take 1 tablet (75 mg total) by mouth 2 (two) times daily with a meal. 120 tablet 2   No current facility-administered medications for this visit.     Musculoskeletal: Strength & Muscle Tone: within normal limits and Telehealth visit Gait & Station: normal, Telehealth visit Patient leans: N/A  Psychiatric Specialty Exam: Review of Systems  There were no vitals taken for this visit.There is no height or weight on file to calculate BMI.  General Appearance: Well Groomed  Eye Contact:  Good  Speech:  Clear and Coherent and Normal Rate  Volume:  Normal  Mood:  Euthymic  Affect:  Appropriate and Congruent  Thought Process:  Coherent, Goal Directed, and NA  Orientation:  Full (Time, Place, and Person)  Thought Content: WDL and Logical   Suicidal Thoughts:  No  Homicidal Thoughts:  No  Memory:  Immediate;   Good Recent;   Good Remote;   Good  Judgement:  Good  Insight:  Good  Psychomotor Activity:  Normal  Concentration:  Concentration: Good and Attention Span: Good  Recall:  Good  Fund of Knowledge: Good  Language: Good  Akathisia:  No  Handed:  Right  AIMS  (if indicated): not done  Assets:  Communication Skills Desire for Improvement Financial Resources/Insurance Housing Physical Health Social Support  ADL's:  Intact  Cognition: WNL  Sleep:  Good   Screenings: GAD-7    Flowsheet Row Video Visit from 12/27/2023 in [facility] Video Visit from 10/03/2023 in [facility] Video Visit from 06/22/2023 in [facility] Video Visit from 04/20/2023 in [facility] Video Visit from 02/07/2023 in [facility]  Total GAD-7 Score 0 0 0 0 0      PHQ2-9    Flowsheet Row Video Visit from 03/05/2024 in [facility] Video Visit from 12/27/2023 in [facility] Video Visit from 10/03/2023 in [facility] Video Visit from 06/22/2023 in [facility] Video Visit from 04/20/2023 in [facility]  PHQ-2 Total Score 0 0 0 0 0  PHQ-9 Total Score 1 1 1 1 1       Flowsheet Row ED from 10/26/2022 in [facility] Emergency Department at [facility] Most recent reading at 10/26/2022  6:13 PM Counselor from 10/03/2022 in [facility] Most recent reading at 10/03/2022 10:25 AM Counselor from 10/03/2022 in [facility] Most recent reading at 10/03/2022  8:16 AM  C-SSRS RISK CATEGORY No Risk No Risk No Risk        Assessment and Plan: Patient notes that he is doing well.  No medication changes made today.  Patient agreeable to continue medication as prescribed.  1. Recurrent major depressive disorder, in partial remission (HCC)  Continue- venlafaxine (EFFEXOR) 75 MG tablet; Take 1 tablet (75 mg total) by mouth 2 (two) times daily with a meal.  Dispense: 120 tablet; Refill: 2    Collaboration of Care: Collaboration of Care: Other  provider involved in patient's care AEB PCP  Patient/Guardian was advised Release of Information must be obtained prior  to any record release in order to collaborate their care with an outside provider. Patient/Guardian was advised if they have not already done so to contact the registration department to sign all necessary forms in order for [place] to release information regarding their care.   Consent: Patient/Guardian gives verbal consent for treatment and assignment of benefits for services provided during this visit. Patient/Guardian expressed understanding and agreed to proceed.   Follow-up in 2.5 months Shanna Cisco, NP 03/05/2024, 3:55 PM

## 2024-03-05 NOTE — Progress Notes (Unsigned)
 BH MD/PA/NP OP Progress Note Virtual Visit via Video Note  I connected with Alan Reeves on 03/05/24 at  3:00 PM EDT by a video enabled telemedicine application and verified that I am speaking with the correct person using two identifiers.  Location: Patient: Home Provider: Clinic   I discussed the limitations of evaluation and management by telemedicine and the availability of in person appointments. The patient expressed understanding and agreed to proceed.  I provided 30 minutes of non-face-to-face time during this encounter.   03/05/2024 3:36 PM Alan Reeves  MRN:  [id number]  Chief Complaint: "This year is going to be great"  HPI:  68 year old male seen today for follow-up psychiatric evaluation. He has a psychiatric history of bipolar disorder, anxiety, and depression.  He is currently managed on Effexor 75 mg twice daily and reports that it is effective in managing his psychiatric conditions.  Today he is well-groomed, pleasant, cooperative, engaged in conversation, maintained eye contact.  He informed Clinical research associate that last year was a terrible year but he reports that that she is going to be great.  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  He also notes that he is looking forward to finding love.  Patient informed writer that his pain has subsided since his last visit.  He also notes that his mood is stable and reports that he has minimal anxiety and depression.  Today provider conducted a GAD-7 and patient scored a 0, at last visit he scored a 0.  Provider conducted PHQ-9 and patient scored a 1, at his last visit he scored a 1.  He endorses adequate sleep and appetite.  Patient notes that he continues to lose weight and reports that he has lost 10 pounds since his last visit.    No medication changes made today.  Patient agreeable to continue medication as prescribed.  No other concerns noted at this time.   Visit Diagnosis:  No diagnosis  found.      Past Psychiatric History: bipolar disorder, anxiety, and depression  Past Medical History:  Past Medical History:  Diagnosis Date  . Anxiety   . Bipolar 1 disorder (HCC)   . Depression   . Diabetes mellitus without complication (HCC)   . Hyperlipidemia   . Hypertension   . Kidney disease     Past Surgical History:  Procedure Laterality Date  . COLONOSCOPY      Family Psychiatric History:  Maternal uncles alcohol use, Sister bipolar disorder, paternal aunts alcohol use   Family History:  Family History  Problem Relation Age of Onset  . Cancer Mother   . Diabetes Mother   . Hypertension Mother   . Hyperlipidemia Mother   . Diabetes Father   . Cancer Father   . Stroke Father     Social History:  Social History   Socioeconomic History  . Marital status: Divorced    Spouse name: Not on file  . Number of children: Not on file  . Years of education: Not on file  . Highest education level: Not on file  Occupational History  . Not on file  Tobacco Use  . Smoking status: Never    Passive exposure: Never  . Smokeless tobacco: Never  Vaping Use  . Vaping status: Never Used  Substance and Sexual Activity  . Alcohol use: No  . Drug use: No  . Sexual activity: Not on file  Other Topics Concern  . Not on file  Social  History Narrative  . Not on file   Social Drivers of Health   Financial Resource Strain: Low Risk  (10/03/2022)   Overall Financial Resource Strain (CARDIA)   . Difficulty of Paying Living Expenses: Not hard at all  Food Insecurity: No Food Insecurity (10/03/2022)   Hunger Vital Sign   . Worried About Programme researcher, broadcasting/film/video in the Last Year: Never true   . Ran Out of Food in the Last Year: Never true  Transportation Needs: No Transportation Needs (10/03/2022)   PRAPARE - Transportation   . Lack of Transportation (Medical): No   . Lack of Transportation (Non-Medical): No  Physical Activity: Inactive (10/03/2022)   Exercise Vital Sign    . Days of Exercise per Week: 0 days   . Minutes of Exercise per Session: 0 min  Stress: No Stress Concern Present (10/03/2022)   [employer] of Occupational Health - Occupational Stress Questionnaire   . Feeling of Stress : Not at all  Social Connections: Socially Isolated (10/03/2022)   Social Connection and Isolation Panel [NHANES]   . Frequency of Communication with Friends and Family: More than three times a week   . Frequency of Social Gatherings with Friends and Family: Once a week   . Attends Religious Services: Never   . Active Member of Clubs or Organizations: No   . Attends Banker Meetings: Never   . Marital Status: Divorced    Allergies: No Known Allergies  Metabolic Disorder Labs: Lab Results  Component Value Date   HGBA1C 9.0 (A) 08/24/2023   No results found for: "PROLACTIN" Lab Results  Component Value Date   CHOL 110 08/22/2023   TRIG 214 (H) 08/22/2023   HDL 31 (L) 08/22/2023   CHOLHDL 3.5 08/22/2023   VLDL 73 (H) 03/31/2014   LDLCALC 45 08/22/2023   LDLCALC 20 11/23/2021   Lab Results  Component Value Date   TSH 0.761 08/22/2023   TSH 0.831 02/18/2021    Therapeutic Level Labs: Lab Results  Component Value Date   LITHIUM 0.5 10/26/2021   LITHIUM 0.6 06/27/2021   No results found for: "VALPROATE" No results found for: "CBMZ"  Current Medications: Current Outpatient Medications  Medication Sig Dispense Refill  . amLODipine (NORVASC) 5 MG tablet Take 1 tablet by mouth once daily 90 tablet 0  . Continuous Glucose Receiver (FREESTYLE LIBRE 3 READER) DEVI 1 Piece by Does not apply route once as needed for up to 1 dose. 1 each 0  . Continuous Glucose Sensor (FREESTYLE LIBRE 3 SENSOR) MISC 1 Piece by Does not apply route every 14 (fourteen) days. Place 1 sensor on the skin every 14 days. Use to check glucose continuously 2 each 2  . glipiZIDE (GLUCOTROL XL) 5 MG 24 hr tablet Take 1 tablet (5 mg total) by mouth daily with  breakfast. 90 tablet 1  . glucose blood (TRUE METRIX BLOOD GLUCOSE TEST) test strip Use as instructed 100 each 12  . Insulin Glargine (BASAGLAR KWIKPEN) 100 UNIT/ML Inject 40 Units into the skin at bedtime. 45 mL 1  . Insulin Pen Needle (TRUEPLUS 5-BEVEL PEN NEEDLES) 31G X 8 MM MISC Use at bedtime. USE AS DIRECTED 100 each 1  . lisinopril (ZESTRIL) 5 MG tablet Take 1 tablet (5 mg total) by mouth daily. 90 tablet 0  . metFORMIN (GLUCOPHAGE) 1000 MG tablet TAKE 1 TABLET BY MOUTH TWICE DAILY WITH MEALS 180 tablet 0  . rosuvastatin (CRESTOR) 20 MG tablet Take 1 tablet (20 mg total) by  mouth daily. 90 tablet 0  . triamcinolone ointment (KENALOG) 0.5 % Apply 1 application. topically 2 (two) times daily. (Patient not taking: Reported on 10/29/2023) 60 g 1  . venlafaxine (EFFEXOR) 75 MG tablet Take 1 tablet (75 mg total) by mouth 2 (two) times daily with a meal. 120 tablet 2   No current facility-administered medications for this visit.     Musculoskeletal: Strength & Muscle Tone: within normal limits and Telehealth visit Gait & Station: normal, Telehealth visit Patient leans: N/A  Psychiatric Specialty Exam: Review of Systems  There were no vitals taken for this visit.There is no height or weight on file to calculate BMI.  General Appearance: Well Groomed  Eye Contact:  Good  Speech:  Clear and Coherent and Normal Rate  Volume:  Normal  Mood:  Euthymic  Affect:  Appropriate and Congruent  Thought Process:  Coherent, Goal Directed, and NA  Orientation:  Full (Time, Place, and Person)  Thought Content: WDL and Logical   Suicidal Thoughts:  No  Homicidal Thoughts:  No  Memory:  Immediate;   Good Recent;   Good Remote;   Good  Judgement:  Good  Insight:  Good  Psychomotor Activity:  Normal  Concentration:  Concentration: Good and Attention Span: Good  Recall:  Good  Fund of Knowledge: Good  Language: Good  Akathisia:  No  Handed:  Right  AIMS (if indicated): not done  Assets:   Communication Skills Desire for Improvement Financial Resources/Insurance Housing Physical Health Social Support  ADL's:  Intact  Cognition: WNL  Sleep:  Good   Screenings: GAD-7    Flowsheet Row Video Visit from 12/27/2023 in [facility] Video Visit from 10/03/2023 in [facility] Video Visit from 06/22/2023 in [facility] Video Visit from 04/20/2023 in [facility] Video Visit from 02/07/2023 in [facility]  Total GAD-7 Score 0 0 0 0 0      PHQ2-9    Flowsheet Row Video Visit from 12/27/2023 in [facility] Video Visit from 10/03/2023 in [facility] Video Visit from 06/22/2023 in [facility] Video Visit from 04/20/2023 in [facility] Office Visit from 03/28/2023 in [facility]  PHQ-2 Total Score 0 0 0 0 0  PHQ-9 Total Score 1 1 1 1  0      Flowsheet Row ED from 10/26/2022 in [facility] Emergency Department at [facility] Most recent reading at 10/26/2022  6:13 PM Counselor from 10/03/2022 in [facility] Most recent reading at 10/03/2022 10:25 AM Counselor from 10/03/2022 in [facility] Most recent reading at 10/03/2022  8:16 AM  C-SSRS RISK CATEGORY No Risk No Risk No Risk        Assessment and Plan: Patient notes that he is doing well.  No medication changes made today.  Patient agreeable to continue medication as prescribed.  1. Recurrent major depressive disorder, in partial remission (HCC)  Continue- venlafaxine (EFFEXOR) 75 MG tablet; Take 1 tablet (75 mg total) by mouth 2 (two) times daily with a meal.  Dispense: 120 tablet; Refill: 2    Collaboration of Care: Collaboration of Care: Other provider involved in patient's care  AEB PCP  Patient/Guardian was advised Release of Information must be obtained prior to any record release in order to collaborate their care with an outside provider.  Patient/Guardian was advised if they have not already done so to contact the registration department to sign all necessary forms in order for [place] to release information regarding their care.   Consent: Patient/Guardian gives verbal consent for treatment and assignment of benefits for services provided during this visit. Patient/Guardian expressed understanding and agreed to proceed.   Follow-up in 2.5 months Shanna Cisco, NP 03/05/2024, 3:36 PM

## 2024-03-21 ENCOUNTER — Other Ambulatory Visit: Payer: Self-pay | Admitting: "Endocrinology

## 2024-03-21 ENCOUNTER — Other Ambulatory Visit: Payer: Self-pay

## 2024-04-02 ENCOUNTER — Other Ambulatory Visit: Payer: Self-pay | Admitting: Family

## 2024-04-02 DIAGNOSIS — I10 Essential (primary) hypertension: Secondary | ICD-10-CM

## 2024-04-23 ENCOUNTER — Telehealth: Payer: Self-pay | Admitting: Family

## 2024-04-23 ENCOUNTER — Other Ambulatory Visit: Payer: Self-pay | Admitting: *Deleted

## 2024-04-23 DIAGNOSIS — I10 Essential (primary) hypertension: Secondary | ICD-10-CM

## 2024-04-23 MED ORDER — LISINOPRIL 5 MG PO TABS
5.0000 mg | ORAL_TABLET | Freq: Every day | ORAL | 0 refills | Status: DC
Start: 2024-04-23 — End: 2024-05-21

## 2024-04-23 NOTE — Telephone Encounter (Signed)
 Patient given 30 day courtesy refill until appointment with provider.

## 2024-04-23 NOTE — Telephone Encounter (Unsigned)
 Copied from CRM [id number]. Topic: Clinical - Medication Refill >> Apr 23, 2024 10:36 AM Alysia Jumbo S wrote: Medication: lisinopril  (ZESTRIL ) 5 MG tablet  Has the patient contacted their pharmacy? No (Agent: If no, request that the patient contact the pharmacy for the refill. If patient does not wish to contact the pharmacy document the reason why and proceed with request.) (Agent: If yes, when and what did the pharmacy advise?)  This is the patient's preferred pharmacy:  [facility] [address] ([address]), [place] - [address] [place] ([place]) [place] [address] Phone: [phone] Fax: [phone]    Is this the correct pharmacy for this prescription? Yes If no, delete pharmacy and type the correct one.   Has the prescription been filled recently? No  Is the patient out of the medication? Yes  Has the patient been seen for an appointment in the last year OR does the patient have an upcoming appointment? Yes  Can we respond through MyChart? Yes  Agent: Please be advised that Rx refills may take up to 3 business days. We ask that you follow-up with your pharmacy.

## 2024-04-28 ENCOUNTER — Encounter: Admitting: Family

## 2024-04-28 ENCOUNTER — Encounter: Payer: Self-pay | Admitting: Family

## 2024-04-28 NOTE — Progress Notes (Signed)
 Erroneous encounter-disregard

## 2024-05-02 ENCOUNTER — Other Ambulatory Visit: Payer: Self-pay

## 2024-05-06 ENCOUNTER — Other Ambulatory Visit: Payer: Self-pay | Admitting: Family Medicine

## 2024-05-06 DIAGNOSIS — E785 Hyperlipidemia, unspecified: Secondary | ICD-10-CM

## 2024-05-06 NOTE — Telephone Encounter (Signed)
 Complete

## 2024-05-14 ENCOUNTER — Encounter (HOSPITAL_COMMUNITY): Payer: Self-pay | Admitting: Psychiatry

## 2024-05-14 ENCOUNTER — Encounter (HOSPITAL_COMMUNITY): Payer: Self-pay

## 2024-05-14 ENCOUNTER — Telehealth (HOSPITAL_COMMUNITY): Admitting: Psychiatry

## 2024-05-14 DIAGNOSIS — F3341 Major depressive disorder, recurrent, in partial remission: Secondary | ICD-10-CM | POA: Diagnosis not present

## 2024-05-14 MED ORDER — VENLAFAXINE HCL 75 MG PO TABS
75.0000 mg | ORAL_TABLET | Freq: Two times a day (BID) | ORAL | 2 refills | Status: DC
Start: 2024-05-14 — End: 2024-09-09

## 2024-05-14 NOTE — Progress Notes (Signed)
 BH MD/PA/NP OP Progress Note Virtual Visit via Video Note  I connected with Alan Reeves on 05/14/24 at  3:00 PM EDT by a video enabled telemedicine application and verified that I am speaking with the correct person using two identifiers.  Location: Patient: Alan Reeves Provider: Clinic   I discussed the limitations of evaluation and management by telemedicine and the availability of in person appointments. The patient expressed understanding and agreed to proceed.  I provided 30 minutes of non-face-to-face time during this encounter.   05/14/2024 1:19 PM Alan Reeves  MRN:  [id number]  Chief Complaint: "I am good"  HPI:  68 year old male seen today for follow-up psychiatric evaluation. He has a psychiatric history of bipolar disorder, anxiety, and depression.  He is currently managed on Effexor  75 mg twice daily and reports that it is effective in managing his psychiatric conditions.  Today he is well-groomed, pleasant, cooperative, engaged in conversation, maintained eye contact.  He informed Clinical research associate that he is doing well.  He notes that he is excited because his daughter will be having his third grandchild in November.  He reports that she was recently married and now the couple will be having a son.  Patient inform her that work is going well.  He also notes that his mood, anxiety, and depression are well-managed.    Today provider conducted a GAD-7 and patient scored a 0, at his last visit he scored a 0.   Provider conducted PHQ-9 and patient scored a 0, at his last visit he scored a 1.  He endorses adequate sleep and appetite.  Today he denies SI/HI/VH, mania, or paranoia.  Overall he notes that he is doing well.  No medication changes made today.  Patient agreeable to continue medication as prescribed.  No other concerns noted at this time.   Visit Diagnosis:  No diagnosis found.       Past Psychiatric History: bipolar disorder, anxiety, and depression  Past Medical  History:  Past Medical History:  Diagnosis Date   Anxiety    Bipolar 1 disorder (HCC)    Depression    Diabetes mellitus without complication (HCC)    Hyperlipidemia    Hypertension    Kidney disease     Past Surgical History:  Procedure Laterality Date   COLONOSCOPY      Family Psychiatric History:  Maternal uncles alcohol use, Sister bipolar disorder, paternal aunts alcohol use   Family History:  Family History  Problem Relation Age of Onset   Cancer Mother    Diabetes Mother    Hypertension Mother    Hyperlipidemia Mother    Diabetes Father    Cancer Father    Stroke Father     Social History:  Social History   Socioeconomic History   Marital status: Divorced    Spouse name: Not on file   Number of children: Not on file   Years of education: Not on file   Highest education level: Bachelor's degree (e.g., BA, AB, BS)  Occupational History   Not on file  Tobacco Use   Smoking status: Never    Passive exposure: Never   Smokeless tobacco: Never  Vaping Use   Vaping status: Never Used  Substance and Sexual Activity   Alcohol use: No   Drug use: No   Sexual activity: Not on file  Other Topics Concern   Not on file  Social History Narrative   Not on file   Social Drivers of Health  Financial Resource Strain: Low Risk  (04/28/2024)   Overall Financial Resource Strain (CARDIA)    Difficulty of Paying Living Expenses: Not hard at all  Food Insecurity: No Food Insecurity (04/28/2024)   Hunger Vital Sign    Worried About Running Out of Food in the Last Year: Never true    Ran Out of Food in the Last Year: Never true  Transportation Needs: No Transportation Needs (04/28/2024)   PRAPARE - Administrator, Civil Service (Medical): No    Lack of Transportation (Non-Medical): No  Physical Activity: Insufficiently Active (04/28/2024)   Exercise Vital Sign    Days of Exercise per Week: 3 days    Minutes of Exercise per Session: 30 min  Stress: No Stress  Concern Present (10/03/2022)   [employer] of Occupational Health - Occupational Stress Questionnaire    Feeling of Stress : Not at all  Social Connections: Moderately Integrated (04/28/2024)   Social Connection and Isolation Panel [NHANES]    Frequency of Communication with Friends and Family: More than three times a week    Frequency of Social Gatherings with Friends and Family: Twice a week    Attends Religious Services: More than 4 times per year    Active Member of Clubs or Organizations: Yes    Attends Engineer, structural: More than 4 times per year    Marital Status: Divorced    Allergies: No Known Allergies  Metabolic Disorder Labs: Lab Results  Component Value Date   HGBA1C 9.0 (A) 08/24/2023   No results found for: "PROLACTIN" Lab Results  Component Value Date   CHOL 110 08/22/2023   TRIG 214 (H) 08/22/2023   HDL 31 (L) 08/22/2023   CHOLHDL 3.5 08/22/2023   VLDL 73 (H) 03/31/2014   LDLCALC 45 08/22/2023   LDLCALC 20 11/23/2021   Lab Results  Component Value Date   TSH 0.761 08/22/2023   TSH 0.831 02/18/2021    Therapeutic Level Labs: Lab Results  Component Value Date   LITHIUM 0.5 10/26/2021   LITHIUM 0.6 06/27/2021   No results found for: "VALPROATE" No results found for: "CBMZ"  Current Medications: Current Outpatient Medications  Medication Sig Dispense Refill   amLODipine  (NORVASC ) 5 MG tablet Take 1 tablet by mouth once daily 90 tablet 0   Continuous Glucose Receiver (FREESTYLE LIBRE 3 READER) DEVI 1 Piece by Does not apply route once as needed for up to 1 dose. 1 each 0   Continuous Glucose Sensor (FREESTYLE LIBRE 3 SENSOR) MISC 1 Piece by Does not apply route every 14 (fourteen) days. Place 1 sensor on the skin every 14 days. Use to check glucose continuously 2 each 2   glipiZIDE  (GLUCOTROL  XL) 5 MG 24 hr tablet Take 1 tablet by mouth once daily with breakfast 90 tablet 0   glucose blood (TRUE METRIX BLOOD GLUCOSE TEST) test strip  Use as instructed 100 each 12   Insulin  Glargine (BASAGLAR  KWIKPEN) 100 UNIT/ML Inject 40 Units into the skin at bedtime. 45 mL 1   Insulin  Pen Needle (TRUEPLUS 5-BEVEL PEN NEEDLES) 31G X 8 MM MISC Use at bedtime. USE AS DIRECTED 100 each 1   lisinopril  (ZESTRIL ) 5 MG tablet Take 1 tablet (5 mg total) by mouth daily. 30 tablet 0   metFORMIN  (GLUCOPHAGE ) 1000 MG tablet TAKE 1 TABLET BY MOUTH TWICE DAILY WITH MEALS 180 tablet 0   rosuvastatin  (CRESTOR ) 20 MG tablet Take 1 tablet by mouth once daily 90 tablet 0   triamcinolone   ointment (KENALOG ) 0.5 % Apply 1 application. topically 2 (two) times daily. (Patient not taking: Reported on 10/29/2023) 60 g 1   venlafaxine  (EFFEXOR ) 75 MG tablet Take 1 tablet (75 mg total) by mouth 2 (two) times daily with a meal. 120 tablet 2   No current facility-administered medications for this visit.     Musculoskeletal: Strength & Muscle Tone: within normal limits and Telehealth visit Gait & Station: normal, Telehealth visit Patient leans: N/A  Psychiatric Specialty Exam: Review of Systems  There were no vitals taken for this visit.There is no height or weight on file to calculate BMI.  General Appearance: Well Groomed  Eye Contact:  Good  Speech:  Clear and Coherent and Normal Rate  Volume:  Normal  Mood:  Euthymic  Affect:  Appropriate and Congruent  Thought Process:  Coherent, Goal Directed, and NA  Orientation:  Full (Time, Place, and Person)  Thought Content: WDL and Logical   Suicidal Thoughts:  No  Homicidal Thoughts:  No  Memory:  Immediate;   Good Recent;   Good Remote;   Good  Judgement:  Good  Insight:  Good  Psychomotor Activity:  Normal  Concentration:  Concentration: Good and Attention Span: Good  Recall:  Good  Fund of Knowledge: Good  Language: Good  Akathisia:  No  Handed:  Right  AIMS (if indicated): not done  Assets:  Communication Skills Desire for Improvement Financial Resources/Insurance Housing Physical  Health Social Support  ADL's:  Intact  Cognition: WNL  Sleep:  Good   Screenings: GAD-7    Flowsheet Row Video Visit from 05/14/2024 in [facility] Video Visit from 12/27/2023 in [facility] Video Visit from 10/03/2023 in [facility] Video Visit from 06/22/2023 in [facility] Video Visit from 04/20/2023 in [facility]  Total GAD-7 Score 0 0 0 0 0      PHQ2-9    Flowsheet Row Video Visit from 05/14/2024 in [facility] Video Visit from 03/05/2024 in [facility] Video Visit from 12/27/2023 in [facility] Video Visit from 10/03/2023 in [facility] Video Visit from 06/22/2023 in [facility]  PHQ-2 Total Score 0 0 0 0 0  PHQ-9 Total Score 0 1 1 1 1       Flowsheet Row ED from 10/26/2022 in [facility] Emergency Department at [facility] Most recent reading at 10/26/2022  6:13 PM Counselor from 10/03/2022 in [facility] Most recent reading at 10/03/2022 10:25 AM Counselor from 10/03/2022 in [facility] Most recent reading at 10/03/2022  8:16 AM  C-SSRS RISK CATEGORY No Risk No Risk No Risk        Assessment and Plan: Patient notes that he is doing well.  No medication changes made today.  Patient agreeable to continue medication as prescribed.  1. Recurrent major depressive disorder, in partial remission (HCC)  Continue- venlafaxine  (EFFEXOR ) 75 MG tablet; Take 1 tablet (75 mg total) by mouth 2 (two) times daily with a meal.  Dispense: 120 tablet; Refill: 2    Collaboration of Care: Collaboration of Care: Other provider involved in patient's care AEB PCP  Patient/Guardian was advised Release of Information must be obtained prior to any record  release in order to collaborate their care with an outside provider. Patient/Guardian was advised if they have not already done so  to contact the registration department to sign all necessary forms in order for us  to release information regarding their care.   Consent: Patient/Guardian gives verbal consent for treatment and assignment of benefits for services provided during this visit. Patient/Guardian expressed understanding and agreed to proceed.   Follow-up in 2.5 months Arlyne Bering, NP 05/14/2024, 1:19 PM

## 2024-05-19 ENCOUNTER — Other Ambulatory Visit: Payer: Self-pay | Admitting: Family

## 2024-05-19 DIAGNOSIS — I10 Essential (primary) hypertension: Secondary | ICD-10-CM

## 2024-05-19 DIAGNOSIS — E1165 Type 2 diabetes mellitus with hyperglycemia: Secondary | ICD-10-CM

## 2024-05-20 ENCOUNTER — Encounter: Payer: Self-pay | Admitting: Family

## 2024-05-21 ENCOUNTER — Telehealth (INDEPENDENT_AMBULATORY_CARE_PROVIDER_SITE_OTHER): Admitting: Family

## 2024-05-21 ENCOUNTER — Other Ambulatory Visit: Payer: Self-pay | Admitting: Family

## 2024-05-21 DIAGNOSIS — E1165 Type 2 diabetes mellitus with hyperglycemia: Secondary | ICD-10-CM | POA: Diagnosis not present

## 2024-05-21 DIAGNOSIS — Z794 Long term (current) use of insulin: Secondary | ICD-10-CM

## 2024-05-21 DIAGNOSIS — I10 Essential (primary) hypertension: Secondary | ICD-10-CM | POA: Diagnosis not present

## 2024-05-21 DIAGNOSIS — E119 Type 2 diabetes mellitus without complications: Secondary | ICD-10-CM

## 2024-05-21 DIAGNOSIS — Z0100 Encounter for examination of eyes and vision without abnormal findings: Secondary | ICD-10-CM | POA: Diagnosis not present

## 2024-05-21 MED ORDER — LISINOPRIL 5 MG PO TABS: 5.0000 mg | ORAL_TABLET | Freq: Every day | ORAL | 0 refills | Status: DC

## 2024-05-21 MED ORDER — METFORMIN HCL 1000 MG PO TABS
1000.0000 mg | ORAL_TABLET | Freq: Two times a day (BID) | ORAL | 0 refills | Status: DC
Start: 2024-05-21 — End: 2024-09-05

## 2024-05-21 NOTE — Progress Notes (Signed)
 Virtual Visit via Video Note  I connected with Alan Reeves, on 05/21/2024 at 1:07 PM by video and verified that I am speaking with the correct person using two identifiers.  Consent: I discussed the limitations, risks, security and privacy concerns of performing an evaluation and management service by video and the availability of in person appointments. I also discussed with the patient that there may be a patient responsible charge related to this service. The patient expressed understanding and agreed to proceed.   Location of Patient: Home  Location of Provider: [facility]   Persons participating in Telemedicine visit: Reeves Canter, NP Alona Jamaica, CMA   History of Present Illness: Alan Reeves is a 68 y.o. male who presents for chronic conditions follow-up.   His issues/concerns for discussion today includes: - Doing well on Lisinopril , no issues/concerns. He does not complain of red flag symptoms such as but not limited to chest pain, shortness of breath, worst headache of life, nausea/vomiting.  - Doing well on Metformin ,Glipizide , and Insulin  Glargine, no issues/concerns. States he only needs refills of Metformin . Home blood sugars 150's. Denies red flag symptoms associated with diabetes. States he is no longer seeing Endocrinology for diabetes because he is doing good. - Due for diabetic eye exam. - Due for diabetic foot exam. - States he plans to come in at later date for lab only appointment.   Past Medical History:  Diagnosis Date   Anxiety    Bipolar 1 disorder (HCC)    Depression    Diabetes mellitus without complication (HCC)    Hyperlipidemia    Hypertension    Kidney disease    No Known Allergies  Current Outpatient Medications on File Prior to Visit  Medication Sig Dispense Refill   amLODipine  (NORVASC ) 5 MG tablet Take 1 tablet by mouth once daily 90 tablet 0   Continuous Glucose Receiver  (FREESTYLE LIBRE 3 READER) DEVI 1 Piece by Does not apply route once as needed for up to 1 dose. 1 each 0   Continuous Glucose Sensor (FREESTYLE LIBRE 3 SENSOR) MISC 1 Piece by Does not apply route every 14 (fourteen) days. Place 1 sensor on the skin every 14 days. Use to check glucose continuously 2 each 2   glipiZIDE  (GLUCOTROL  XL) 5 MG 24 hr tablet Take 1 tablet by mouth once daily with breakfast 90 tablet 0   glucose blood (TRUE METRIX BLOOD GLUCOSE TEST) test strip Use as instructed 100 each 12   Insulin  Glargine (BASAGLAR  KWIKPEN) 100 UNIT/ML Inject 40 Units into the skin at bedtime. 45 mL 1   Insulin  Pen Needle (TRUEPLUS 5-BEVEL PEN NEEDLES) 31G X 8 MM MISC Use at bedtime. USE AS DIRECTED 100 each 1   lisinopril  (ZESTRIL ) 5 MG tablet Take 1 tablet (5 mg total) by mouth daily. 30 tablet 0   metFORMIN  (GLUCOPHAGE ) 1000 MG tablet TAKE 1 TABLET BY MOUTH TWICE DAILY WITH MEALS 180 tablet 0   rosuvastatin  (CRESTOR ) 20 MG tablet Take 1 tablet by mouth once daily 90 tablet 0   triamcinolone  ointment (KENALOG ) 0.5 % Apply 1 application. topically 2 (two) times daily. (Patient not taking: Reported on 10/29/2023) 60 g 1   venlafaxine  (EFFEXOR ) 75 MG tablet Take 1 tablet (75 mg total) by mouth 2 (two) times daily with a meal. 120 tablet 2   No current facility-administered medications on file prior to visit.    Observations/Objective: Alert and oriented x 3. Not in acute  distress. Physical examination not completed as this is a telemedicine visit.  Assessment and Plan: 1. Primary hypertension (Primary) - Continue Lisinopril  as prescribed.  - Counseled on blood pressure goal of less than 130/80, low-sodium, DASH diet, medication compliance, and 150 minutes of moderate intensity exercise per week as tolerated. Counseled on medication adherence and adverse effects. - Follow-up with primary provider in 3 months or sooner if needed.  - lisinopril  (ZESTRIL ) 5 MG tablet; Take 1 tablet (5 mg total) by  mouth daily.  Dispense: 90 tablet; Refill: 0  2. Type 2 diabetes mellitus with hyperglycemia, with long-term current use of insulin  (HCC) - Continue Metformin  as prescribed.  - Continue Glipizide  and Insulin  Glargine as prescribed. No refills needed as of present.  - Hemoglobin A1c result pending. - Routine screening.  - Discussed the importance of healthy eating habits, low-carbohydrate diet, low-sugar diet, regular aerobic exercise (at least 150 minutes a week as tolerated) and medication compliance to achieve or maintain control of diabetes. Counseled on medication adherence/adverse effects.  - Follow-up with primary provider as scheduled. - metFORMIN  (GLUCOPHAGE ) 1000 MG tablet; Take 1 tablet (1,000 mg total) by mouth 2 (two) times daily with a meal.  Dispense: 180 tablet; Refill: 0 - POCT glycosylated hemoglobin (Hb A1C); Future - Microalbumin / creatinine urine ratio; Future  3. Diabetic eye exam [facility]) - Referral to Ophthalmology for evaluation/management. - Ambulatory referral to Ophthalmology  4. Encounter for diabetic foot exam [facility]) - Referral to Podiatry for evaluation/management. - Ambulatory referral to Podiatry   Follow Up Instructions: Follow-up with primary provider as scheduled.   Patient was given clear instructions to go to Emergency Department or return to medical center if symptoms don't improve, worsen, or new problems develop.The patient verbalized understanding.  I discussed the assessment and treatment plan with the patient. The patient was provided an opportunity to ask questions and all were answered. The patient agreed with the plan and demonstrated an understanding of the instructions.   The patient was advised to call back or seek an in-person evaluation if the symptoms worsen or if the condition fails to improve as anticipated.     I provided 10 minutes total of non-face-to-face time during this encounter.   Senaida Dama, NP  [facility] at [facility] [place], [place] [phone] 05/21/2024, 1:07 PM

## 2024-06-25 ENCOUNTER — Other Ambulatory Visit: Payer: Self-pay | Admitting: "Endocrinology

## 2024-06-27 ENCOUNTER — Other Ambulatory Visit: Payer: Self-pay

## 2024-07-04 ENCOUNTER — Other Ambulatory Visit: Payer: Self-pay | Admitting: Family

## 2024-07-04 DIAGNOSIS — I10 Essential (primary) hypertension: Secondary | ICD-10-CM

## 2024-07-30 ENCOUNTER — Telehealth: Payer: Self-pay | Admitting: Family

## 2024-07-30 NOTE — Telephone Encounter (Signed)
 Copied from CRM [id number]. Topic: Clinical - Prescription Issue >> Jul 29, 2024  5:53 PM Zebedee SAUNDERS wrote: Reason for CRM: Received call from [facility] per [facility] ph:[phone] fax: [phone],  sent on 07/14/2024 for Omnipod 5 automatic adjust insulin . Please confirm

## 2024-08-01 NOTE — Telephone Encounter (Signed)
 I faxed back on today

## 2024-08-01 NOTE — Telephone Encounter (Signed)
 [facility] form signed on 07/31/2024.

## 2024-08-07 ENCOUNTER — Telehealth (HOSPITAL_COMMUNITY): Admitting: Psychiatry

## 2024-08-08 ENCOUNTER — Other Ambulatory Visit: Payer: Self-pay

## 2024-08-16 ENCOUNTER — Other Ambulatory Visit: Payer: Self-pay | Admitting: Family

## 2024-08-16 DIAGNOSIS — E785 Hyperlipidemia, unspecified: Secondary | ICD-10-CM

## 2024-08-18 NOTE — Telephone Encounter (Signed)
 Complete

## 2024-08-22 ENCOUNTER — Telehealth (HOSPITAL_COMMUNITY): Admitting: Psychiatry

## 2024-09-02 ENCOUNTER — Other Ambulatory Visit: Payer: Self-pay | Admitting: Family

## 2024-09-02 DIAGNOSIS — I10 Essential (primary) hypertension: Secondary | ICD-10-CM

## 2024-09-03 NOTE — Telephone Encounter (Signed)
 Complete

## 2024-09-04 ENCOUNTER — Other Ambulatory Visit: Payer: Self-pay | Admitting: Family

## 2024-09-04 DIAGNOSIS — I10 Essential (primary) hypertension: Secondary | ICD-10-CM

## 2024-09-04 DIAGNOSIS — E1165 Type 2 diabetes mellitus with hyperglycemia: Secondary | ICD-10-CM

## 2024-09-05 ENCOUNTER — Telehealth (HOSPITAL_COMMUNITY): Payer: Self-pay | Admitting: Psychiatry

## 2024-09-05 ENCOUNTER — Other Ambulatory Visit: Payer: Self-pay | Admitting: Family

## 2024-09-05 ENCOUNTER — Encounter (HOSPITAL_COMMUNITY): Payer: Self-pay

## 2024-09-05 DIAGNOSIS — E1165 Type 2 diabetes mellitus with hyperglycemia: Secondary | ICD-10-CM

## 2024-09-05 MED ORDER — METFORMIN HCL 1000 MG PO TABS: 1000.0000 mg | ORAL_TABLET | Freq: Two times a day (BID) | ORAL | 0 refills | Status: AC

## 2024-09-05 NOTE — Telephone Encounter (Signed)
-   Lisinopril  prescribed (09/03/2024  5:35 PM EDT). - Metformin  prescribed (09/05/2024  7:53 AM EDT).

## 2024-09-08 NOTE — Telephone Encounter (Signed)
-   Lisinopril  prescribed (09/03/2024  5:35 PM EDT). - Metformin  prescribed (09/05/2024  7:53 AM EDT).

## 2024-09-09 ENCOUNTER — Ambulatory Visit (INDEPENDENT_AMBULATORY_CARE_PROVIDER_SITE_OTHER): Payer: Self-pay | Admitting: Psychiatry

## 2024-09-09 ENCOUNTER — Encounter (HOSPITAL_COMMUNITY): Payer: Self-pay | Admitting: Psychiatry

## 2024-09-09 DIAGNOSIS — F3341 Major depressive disorder, recurrent, in partial remission: Secondary | ICD-10-CM

## 2024-09-09 MED ORDER — VENLAFAXINE HCL 75 MG PO TABS: 75.0000 mg | ORAL_TABLET | Freq: Two times a day (BID) | ORAL | 3 refills | Status: DC

## 2024-09-09 NOTE — Progress Notes (Addendum)
 [facility] MD/PA/NP OP Progress Note   09/09/2024 3:47 PM Alan Reeves  MRN:  [id number]  Chief Complaint: I am good  HPI:  68 year old male seen today for follow-up psychiatric evaluation. He has a psychiatric history of bipolar disorder, anxiety, and depression.  He is currently managed on Effexor  75 mg twice daily and reports that it is effective in managing his psychiatric conditions.  Today he is well-groomed, pleasant, cooperative, engaged in conversation, maintained eye contact.  He informed Clinical research associate that he is doing well.  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 He reports that he finds enjoyment in her job. Recently he notes that his daughter and her husband moved in with him. He reports that he is enjoying them in his home and excited because his daughter will be having his third grandchild (boy) in November. Patient notes that he is saddened because his older two grandchildren are not as close to him or his daughter as they use to. He reports that the children father keeps them away. He does note that they attempt to visit there games to show support.  Overall mentally he reports that his mood is stable and notes that he has minimal anxiety and depression.  Today provider conducted a GAD-7 and patient scored a 0.  Provider conducted PHQ-9 and patient scored a 4.  He endorses adequate appetite. Patient notes that he has lost 10 pounds since his last visit. He notes that he sleeps 5-6 hours. Patient not interested in PRN sleep medications. Today he denies SI/HI/VH, mania, or paranoia.  Overall he notes that he is doing well.  No medication changes made today.  Patient agreeable to continue medication as prescribed.  No other concerns noted at this time.   Visit Diagnosis:    ICD-10-CM   1. Recurrent major depressive disorder, in partial remission  F33.41 venlafaxine  (EFFEXOR ) 75 MG tablet           Past Psychiatric History: bipolar disorder, anxiety, and depression  Past Medical  History:  Past Medical History:  Diagnosis Date   Anxiety    Bipolar 1 disorder (HCC)    Depression    Diabetes mellitus without complication (HCC)    Hyperlipidemia    Hypertension    Kidney disease     Past Surgical History:  Procedure Laterality Date   COLONOSCOPY      Family Psychiatric History:  Maternal uncles alcohol use, Sister bipolar disorder, paternal aunts alcohol use   Family History:  Family History  Problem Relation Age of Onset   Cancer Mother    Diabetes Mother    Hypertension Mother    Hyperlipidemia Mother    Diabetes Father    Cancer Father    Stroke Father     Social History:  Social History   Socioeconomic History   Marital status: Divorced    Spouse name: Not on file   Number of children: Not on file   Years of education: Not on file   Highest education level: Bachelor's degree (e.g., BA, AB, BS)  Occupational History   Not on file  Tobacco Use   Smoking status: Never    Passive exposure: Never   Smokeless tobacco: Never  Vaping Use   Vaping status: Never Used  Substance and Sexual Activity   Alcohol use: No   Drug use: No   Sexual activity: Not on file  Other Topics Concern   Not on file  Social History Narrative   Not on file  Social Drivers of Corporate investment banker Strain: Low Risk  (04/28/2024)   Overall Financial Resource Strain (CARDIA)    Difficulty of Paying Living Expenses: Not hard at all  Food Insecurity: No Food Insecurity (04/28/2024)   Hunger Vital Sign    Worried About Running Out of Food in the Last Year: Never true    Ran Out of Food in the Last Year: Never true  Transportation Needs: No Transportation Needs (04/28/2024)   PRAPARE - Administrator, Civil Service (Medical): No    Lack of Transportation (Non-Medical): No  Physical Activity: Insufficiently Active (04/28/2024)   Exercise Vital Sign    Days of Exercise per Week: 3 days    Minutes of Exercise per Session: 30 min  Stress: No Stress  Concern Present (10/03/2022)   [employer] of Occupational Health - Occupational Stress Questionnaire    Feeling of Stress : Not at all  Social Connections: Moderately Integrated (04/28/2024)   Social Connection and Isolation Panel    Frequency of Communication with Friends and Family: More than three times a week    Frequency of Social Gatherings with Friends and Family: Twice a week    Attends Religious Services: More than 4 times per year    Active Member of Clubs or Organizations: Yes    Attends Engineer, structural: More than 4 times per year    Marital Status: Divorced    Allergies: No Known Allergies  Metabolic Disorder Labs: Lab Results  Component Value Date   HGBA1C 9.0 (A) 08/24/2023   No results found for: PROLACTIN Lab Results  Component Value Date   CHOL 110 08/22/2023   TRIG 214 (H) 08/22/2023   HDL 31 (L) 08/22/2023   CHOLHDL 3.5 08/22/2023   VLDL 73 (H) 03/31/2014   LDLCALC 45 08/22/2023   LDLCALC 20 11/23/2021   Lab Results  Component Value Date   TSH 0.761 08/22/2023   TSH 0.831 02/18/2021    Therapeutic Level Labs: Lab Results  Component Value Date   LITHIUM 0.5 10/26/2021   LITHIUM 0.6 06/27/2021   No results found for: VALPROATE No results found for: CBMZ  Current Medications: Current Outpatient Medications  Medication Sig Dispense Refill   amLODipine  (NORVASC ) 5 MG tablet Take 1 tablet by mouth once daily 90 tablet 0   Continuous Glucose Receiver (FREESTYLE LIBRE 3 READER) DEVI 1 Piece by Does not apply route once as needed for up to 1 dose. 1 each 0   Continuous Glucose Sensor (FREESTYLE LIBRE 3 SENSOR) MISC 1 Piece by Does not apply route every 14 (fourteen) days. Place 1 sensor on the skin every 14 days. Use to check glucose continuously 2 each 2   glipiZIDE  (GLUCOTROL  XL) 5 MG 24 hr tablet Take 1 tablet by mouth once daily with breakfast 90 tablet 0   glucose blood (TRUE METRIX BLOOD GLUCOSE TEST) test strip Use as  instructed 100 each 12   Insulin  Glargine (BASAGLAR  KWIKPEN) 100 UNIT/ML Inject 40 Units into the skin at bedtime. 45 mL 1   Insulin  Pen Needle (TRUEPLUS 5-BEVEL PEN NEEDLES) 31G X 8 MM MISC Use at bedtime. USE AS DIRECTED 100 each 1   lisinopril  (ZESTRIL ) 5 MG tablet Take 1 tablet by mouth once daily 90 tablet 0   metFORMIN  (GLUCOPHAGE ) 1000 MG tablet Take 1 tablet (1,000 mg total) by mouth 2 (two) times daily with a meal. 180 tablet 0   rosuvastatin  (CRESTOR ) 20 MG tablet Take 1 tablet by  mouth once daily 90 tablet 0   triamcinolone  ointment (KENALOG ) 0.5 % Apply 1 application. topically 2 (two) times daily. (Patient not taking: Reported on 10/29/2023) 60 g 1   venlafaxine  (EFFEXOR ) 75 MG tablet Take 1 tablet (75 mg total) by mouth 2 (two) times daily with a meal. 120 tablet 3   No current facility-administered medications for this visit.     Musculoskeletal: Strength & Muscle Tone: within normal limits Gait & Station: normal Patient leans: N/A  Psychiatric Specialty Exam: Review of Systems  There were no vitals taken for this visit.There is no height or weight on file to calculate BMI.  General Appearance: Well Groomed  Eye Contact:  Good  Speech:  Clear and Coherent and Normal Rate  Volume:  Normal  Mood:  Euthymic  Affect:  Appropriate and Congruent  Thought Process:  Coherent, Goal Directed, and NA  Orientation:  Full (Time, Place, and Person)  Thought Content: WDL and Logical   Suicidal Thoughts:  No  Homicidal Thoughts:  No  Memory:  Immediate;   Good Recent;   Good Remote;   Good  Judgement:  Good  Insight:  Good  Psychomotor Activity:  Normal  Concentration:  Concentration: Good and Attention Span: Good  Recall:  Good  Fund of Knowledge: Good  Language: Good  Akathisia:  No  Handed:  Right  AIMS (if indicated): not done  Assets:  Communication Skills Desire for Improvement Financial Resources/Insurance Housing Physical Health Social Support  ADL's:  Intact   Cognition: WNL  Sleep:  Fair   Screenings: GAD-7    Flowsheet Row Clinical Support from 09/09/2024 in [facility] Video Visit from 05/14/2024 in [facility] Video Visit from 12/27/2023 in [facility] Video Visit from 10/03/2023 in [facility] Video Visit from 06/22/2023 in [facility]  Total GAD-7 Score 0 0 0 0 0   PHQ2-9    Flowsheet Row Clinical Support from 09/09/2024 in [facility] Video Visit from 05/14/2024 in [facility] Video Visit from 03/05/2024 in [facility] Video Visit from 12/27/2023 in [facility] Video Visit from 10/03/2023 in [facility]  PHQ-2 Total Score 0 0 0 0 0  PHQ-9 Total Score 4 0 1 1 1    Flowsheet Row ED from 10/26/2022 in [facility] Emergency Department at [facility] Most recent reading at 10/26/2022  6:13 PM Counselor from 10/03/2022 in [facility] Most recent reading at 10/03/2022 10:25 AM Counselor from 10/03/2022 in [facility] Most recent reading at 10/03/2022  8:16 AM  C-SSRS RISK CATEGORY No Risk No Risk No Risk     Assessment and Plan: Patient notes that he is doing well.  His sleep is fair noting he sleeps 5 to 6 hours.  At this time he is not interested in as needed sleep medication.  No medication changes made today.  Patient agreeable to continue medication as prescribed.  1. Recurrent major depressive disorder, in partial remission (HCC)  Continue- venlafaxine  (EFFEXOR ) 75 MG tablet; Take 1 tablet (75 mg total) by mouth 2 (two) times daily with a meal.  Dispense: 120 tablet; Refill: 2    Collaboration of Care: Collaboration of Care: Other provider involved in patient's care AEB PCP  Patient/Guardian  was advised Release of Information must be obtained prior to any record release  in order to collaborate their care with an outside provider. Patient/Guardian was advised if they have not already done so to contact the registration department to sign all necessary forms in order for us  to release information regarding their care.   Consent: Patient/Guardian gives verbal consent for treatment and assignment of benefits for services provided during this visit. Patient/Guardian expressed understanding and agreed to proceed.   Follow-up in 2.5 months Zane FORBES Bach, NP 09/09/2024, 3:47 PM

## 2024-09-21 ENCOUNTER — Other Ambulatory Visit: Payer: Self-pay | Admitting: "Endocrinology

## 2024-09-25 ENCOUNTER — Telehealth: Payer: Self-pay | Admitting: Family

## 2024-09-25 NOTE — Telephone Encounter (Signed)
 Copied from CRM [id number]. Topic: Clinical - Prescription Issue >> Sep 25, 2024 11:15 AM Hadassah PARAS wrote: Reason for CRM: PT's medication glipiZIDE  (GLUCOTROL  XL) 5 MG 24 hr tablet was refused. Pharmacy advised pt to call office. Please advice [phone]

## 2024-09-26 NOTE — Telephone Encounter (Signed)
 Patient established with Endocrinology. Glipizide  last prescribed by Nida, Gebreselassie W, MD. Request refills from the same. Please let me know if I can further assist. Thank you.

## 2024-09-30 NOTE — Telephone Encounter (Signed)
 I called patient with pcp recommendations no one answered so I left a voicemail to return my call.

## 2024-10-01 ENCOUNTER — Other Ambulatory Visit: Payer: Self-pay | Admitting: Family

## 2024-10-01 DIAGNOSIS — I10 Essential (primary) hypertension: Secondary | ICD-10-CM

## 2024-10-02 ENCOUNTER — Other Ambulatory Visit: Payer: Self-pay | Admitting: Family

## 2024-10-02 ENCOUNTER — Telehealth: Payer: Self-pay | Admitting: Emergency Medicine

## 2024-10-02 ENCOUNTER — Other Ambulatory Visit: Payer: Self-pay

## 2024-10-02 DIAGNOSIS — E1165 Type 2 diabetes mellitus with hyperglycemia: Secondary | ICD-10-CM

## 2024-10-02 NOTE — Telephone Encounter (Signed)
 I called patient with recommendations no one answered so I left a voicemail to return my call

## 2024-10-02 NOTE — Telephone Encounter (Signed)
 Copied from CRM [id number]. Topic: Clinical - Medication Question >> Oct 02, 2024  4:20 PM Viola F wrote: Reason for CRM: Patient returned Nirvi Boehler's phone call, I relayed Amy's message and patient says he doesn't see that Endocrinology anymore and was hopin Amy could write a new script for the  glipiZIDE  (GLUCOTROL  XL) 5 MG 24 hr tablet

## 2024-10-03 ENCOUNTER — Other Ambulatory Visit: Payer: Self-pay | Admitting: Family

## 2024-10-03 ENCOUNTER — Other Ambulatory Visit: Payer: Self-pay

## 2024-10-03 DIAGNOSIS — E1165 Type 2 diabetes mellitus with hyperglycemia: Secondary | ICD-10-CM

## 2024-10-03 MED ORDER — TRUEPLUS 5-BEVEL PEN NEEDLES 31G X 8 MM MISC
1.0000 | Freq: Every day | 11 refills | Status: AC
  Filled 2024-10-03 – 2024-10-31 (×2): qty 100, 90d supply, fill #0

## 2024-10-03 MED ORDER — GLIPIZIDE ER 5 MG PO TB24: 5.0000 mg | ORAL_TABLET | Freq: Every day | ORAL | 0 refills | Status: AC

## 2024-10-03 MED ORDER — BASAGLAR KWIKPEN 100 UNIT/ML ~~LOC~~ SOPN
40.0000 [IU] | PEN_INJECTOR | Freq: Every day | SUBCUTANEOUS | 2 refills | Status: AC
  Filled 2024-10-03 – 2024-10-31 (×2): qty 15, 37d supply, fill #0
  Filled 2024-12-09: qty 15, 37d supply, fill #1

## 2024-10-03 NOTE — Telephone Encounter (Signed)
 Complete

## 2024-10-06 NOTE — Telephone Encounter (Signed)
 I called patient to let him know his requested was completed no one answered so I left a voicemail to return my call.

## 2024-10-07 NOTE — Telephone Encounter (Signed)
 Patient returned my call yesterday 10/06/2024 and I made him aware

## 2024-10-14 ENCOUNTER — Other Ambulatory Visit: Payer: Self-pay

## 2024-10-31 ENCOUNTER — Other Ambulatory Visit: Payer: Self-pay

## 2024-11-16 ENCOUNTER — Other Ambulatory Visit: Payer: Self-pay | Admitting: Family

## 2024-11-16 DIAGNOSIS — E785 Hyperlipidemia, unspecified: Secondary | ICD-10-CM

## 2024-11-17 NOTE — Telephone Encounter (Signed)
 Complete

## 2024-11-20 ENCOUNTER — Telehealth (HOSPITAL_COMMUNITY): Payer: Self-pay | Admitting: Psychiatry

## 2024-12-08 ENCOUNTER — Encounter (HOSPITAL_COMMUNITY): Payer: Self-pay | Admitting: Psychiatry

## 2024-12-08 ENCOUNTER — Telehealth (INDEPENDENT_AMBULATORY_CARE_PROVIDER_SITE_OTHER): Payer: Self-pay | Admitting: Psychiatry

## 2024-12-08 DIAGNOSIS — F3341 Major depressive disorder, recurrent, in partial remission: Secondary | ICD-10-CM | POA: Diagnosis not present

## 2024-12-08 MED ORDER — VENLAFAXINE HCL 75 MG PO TABS: 75.0000 mg | ORAL_TABLET | Freq: Two times a day (BID) | ORAL | 3 refills | Status: AC

## 2024-12-08 NOTE — Progress Notes (Signed)
 BH MD/PA/NP OP Progress Note Virtual Visit via Video Note  I connected with Alan Reeves on 12/08/2024 at  3:30 PM EST by a video enabled telemedicine application and verified that I am speaking with the correct person using two identifiers.  Location: Patient: Home Provider: Clinic   I discussed the limitations of evaluation and management by telemedicine and the availability of in person appointments. The patient expressed understanding and agreed to proceed.  I provided 30 minutes of non-face-to-face time during this encounter.     12/08/2024 3:49 PM Alan Reeves  MRN:  [id number]  Chief Complaint: I have problems sleeping sometimes but I'm good  HPI:  68 year old male seen today for follow-up psychiatric evaluation. He has a psychiatric history of bipolar disorder, anxiety, and depression.  He is currently managed on Effexor  75 mg twice daily and reports that it is effective in managing his psychiatric conditions.  Today he is well-groomed, pleasant, cooperative, engaged in conversation, maintained eye contact.  He informed clinical research associate that he has issues sleeping at times but notes that overall he is doing well. He notes that he sleeps 6-7 hours. Patient informed clinical research associate that he is happy because he has a new grandson ([name]).  He informed clinical research associate that his mood is stable and notes that he has minimal anxiety and depression.  Today provider conducted a GAD-7 and patient scored a 0 at his last visit he scored a 0.  Provider conducted PHQ-9 and patient scored a 1, at his last visit he scored a 4.  He endorses adequate appetite. Today he denies SI/HI/VH, mania, or paranoia.  Overall he notes that he is doing well.  No medication changes made today.  Patient agreeable to continue medication as prescribed.  No other concerns noted at this time.   Visit Diagnosis:    ICD-10-CM   1. Recurrent major depressive disorder, in partial remission  F33.41 venlafaxine  (EFFEXOR ) 75 MG tablet             Past Psychiatric History: bipolar disorder, anxiety, and depression  Past Medical History:  Past Medical History:  Diagnosis Date   Anxiety    Bipolar 1 disorder (HCC)    Depression    Diabetes mellitus without complication (HCC)    Hyperlipidemia    Hypertension    Kidney disease     Past Surgical History:  Procedure Laterality Date   COLONOSCOPY      Family Psychiatric History:  Maternal uncles alcohol use, Sister bipolar disorder, paternal aunts alcohol use   Family History:  Family History  Problem Relation Age of Onset   Cancer Mother    Diabetes Mother    Hypertension Mother    Hyperlipidemia Mother    Diabetes Father    Cancer Father    Stroke Father     Social History:  Social History   Socioeconomic History   Marital status: Divorced    Spouse name: Not on file   Number of children: Not on file   Years of education: Not on file   Highest education level: Bachelor's degree (e.g., BA, AB, BS)  Occupational History   Not on file  Tobacco Use   Smoking status: Never    Passive exposure: Never   Smokeless tobacco: Never  Vaping Use   Vaping status: Never Used  Substance and Sexual Activity   Alcohol use: No   Drug use: No   Sexual activity: Not on file  Other Topics Concern   Not on file  Social  History Narrative   Not on file   Social Drivers of Health   Tobacco Use: Low Risk (09/09/2024)   Patient History    Smoking Tobacco Use: Never    Smokeless Tobacco Use: Never    Passive Exposure: Never  Financial Resource Strain: Low Risk (04/28/2024)   Overall Financial Resource Strain (CARDIA)    Difficulty of Paying Living Expenses: Not hard at all  Food Insecurity: No Food Insecurity (04/28/2024)   Hunger Vital Sign    Worried About Running Out of Food in the Last Year: Never true    Ran Out of Food in the Last Year: Never true  Transportation Needs: No Transportation Needs (04/28/2024)   PRAPARE - Scientist, Research (physical Sciences) (Medical): No    Lack of Transportation (Non-Medical): No  Physical Activity: Insufficiently Active (04/28/2024)   Exercise Vital Sign    Days of Exercise per Week: 3 days    Minutes of Exercise per Session: 30 min  Stress: No Stress Concern Present (10/03/2022)   [employer] of Occupational Health - Occupational Stress Questionnaire    Feeling of Stress : Not at all  Social Connections: Moderately Integrated (04/28/2024)   Social Connection and Isolation Panel    Frequency of Communication with Friends and Family: More than three times a week    Frequency of Social Gatherings with Friends and Family: Twice a week    Attends Religious Services: More than 4 times per year    Active Member of Clubs or Organizations: Yes    Attends Banker Meetings: More than 4 times per year    Marital Status: Divorced  Depression (PHQ2-9): Low Risk (12/08/2024)   Depression (PHQ2-9)    PHQ-2 Score: 1  Alcohol Screen: Low Risk (10/03/2022)   Alcohol Screen    Last Alcohol Screening Score (AUDIT): 0  Housing: Low Risk (04/28/2024)   Housing Stability Vital Sign    Unable to Pay for Housing in the Last Year: No    Number of Times Moved in the Last Year: 0    Homeless in the Last Year: No  Utilities: Not At Risk (10/03/2022)   AHC Utilities    Threatened with loss of utilities: No  Health Literacy: Not on file    Allergies: No Known Allergies  Metabolic Disorder Labs: Lab Results  Component Value Date   HGBA1C 9.0 (A) 08/24/2023   No results found for: PROLACTIN Lab Results  Component Value Date   CHOL 110 08/22/2023   TRIG 214 (H) 08/22/2023   HDL 31 (L) 08/22/2023   CHOLHDL 3.5 08/22/2023   VLDL 73 (H) 03/31/2014   LDLCALC 45 08/22/2023   LDLCALC 20 11/23/2021   Lab Results  Component Value Date   TSH 0.761 08/22/2023   TSH 0.831 02/18/2021    Therapeutic Level Labs: Lab Results  Component Value Date   LITHIUM 0.5 10/26/2021   LITHIUM 0.6  06/27/2021   No results found for: VALPROATE No results found for: CBMZ  Current Medications: Current Outpatient Medications  Medication Sig Dispense Refill   amLODipine  (NORVASC ) 5 MG tablet Take 1 tablet by mouth once daily 90 tablet 0   Continuous Glucose Receiver (FREESTYLE LIBRE 3 READER) DEVI 1 Piece by Does not apply route once as needed for up to 1 dose. 1 each 0   Continuous Glucose Sensor (FREESTYLE LIBRE 3 SENSOR) MISC 1 Piece by Does not apply route every 14 (fourteen) days. Place 1 sensor on the skin every 14  days. Use to check glucose continuously 2 each 2   glipiZIDE  (GLUCOTROL  XL) 5 MG 24 hr tablet Take 1 tablet (5 mg total) by mouth daily with breakfast. 90 tablet 0   glucose blood (TRUE METRIX BLOOD GLUCOSE TEST) test strip Use as instructed 100 each 12   Insulin  Glargine (BASAGLAR  KWIKPEN) 100 UNIT/ML Inject 40 Units into the skin at bedtime. 15 mL 2   Insulin  Pen Needle (TRUEPLUS 5-BEVEL PEN NEEDLES) 31G X 8 MM MISC Use at bedtime. USE AS DIRECTED 100 each 11   lisinopril  (ZESTRIL ) 5 MG tablet Take 1 tablet by mouth once daily 90 tablet 0   metFORMIN  (GLUCOPHAGE ) 1000 MG tablet Take 1 tablet (1,000 mg total) by mouth 2 (two) times daily with a meal. 180 tablet 0   rosuvastatin  (CRESTOR ) 20 MG tablet Take 1 tablet by mouth once daily 90 tablet 0   triamcinolone  ointment (KENALOG ) 0.5 % Apply 1 application. topically 2 (two) times daily. (Patient not taking: Reported on 10/29/2023) 60 g 1   venlafaxine  (EFFEXOR ) 75 MG tablet Take 1 tablet (75 mg total) by mouth 2 (two) times daily with a meal. 120 tablet 3   No current facility-administered medications for this visit.     Musculoskeletal: Strength & Muscle Tone: within normal limits and Telehealth visit Gait & Station: normal, Telehealth visit Patient leans: N/A  Psychiatric Specialty Exam: Review of Systems  There were no vitals taken for this visit.There is no height or weight on file to calculate BMI.   General Appearance: Well Groomed  Eye Contact:  Good  Speech:  Clear and Coherent and Normal Rate  Volume:  Normal  Mood:  Euthymic  Affect:  Appropriate and Congruent  Thought Process:  Coherent, Goal Directed, and NA  Orientation:  Full (Time, Place, and Person)  Thought Content: WDL and Logical   Suicidal Thoughts:  No  Homicidal Thoughts:  No  Memory:  Immediate;   Good Recent;   Good Remote;   Good  Judgement:  Good  Insight:  Good  Psychomotor Activity:  Normal  Concentration:  Concentration: Good and Attention Span: Good  Recall:  Good  Fund of Knowledge: Good  Language: Good  Akathisia:  No  Handed:  Right  AIMS (if indicated): not done  Assets:  Communication Skills Desire for Improvement Financial Resources/Insurance Housing Physical Health Social Support  ADL's:  Intact  Cognition: WNL  Sleep:  Fair   Screenings: GAD-7    Flowsheet Row Video Visit from 12/08/2024 in [facility] Clinical Support from 09/09/2024 in [facility] Video Visit from 05/14/2024 in [facility] Video Visit from 12/27/2023 in [facility] from [facility]  Total GAD-7 Score 0 0 0 0 0   PHQ2-9    Flowsheet Row Video Visit from 12/08/2024 in [facility] Clinical Support from 09/09/2024 in [facility] Video Visit from 05/14/2024 in [facility] Video Visit from 03/05/2024 in [facility] Video Visit from 12/27/2023 in [facility]  PHQ-2 Total Score 0 0 0 0 0  PHQ-9 Total Score 1 4 0 1 1   Flowsheet Row ED from 10/26/2022 in [facility] Emergency Department at [facility] Most recent reading at 10/26/2022  6:13 PM Counselor from 10/03/2022 in [facility] Most recent reading at 10/03/2022 10:25 AM  Counselor from 10/03/2022 in [facility] Most recent reading at 10/03/2022  8:16 AM  C-SSRS RISK CATEGORY No Risk No Risk No Risk     Assessment and Plan: Patient notes that he is doing well.  His sleep is fair noting he sleeps 6 to 7 hours.  At this time he is not interested in as needed sleep medication (doxepin, trazodone, mirtazapine, Unisom, or melatonin).  No medication changes made today.  Patient agreeable to continue medication as prescribed.  1. Recurrent major depressive disorder, in partial remission (HCC)  Continue- venlafaxine  (EFFEXOR ) 75 MG tablet; Take 1 tablet (75 mg total) by mouth 2 (two) times daily with a meal.  Dispense: 120 tablet; Refill: 2    Collaboration of Care: Collaboration of Care: Other provider involved in patient's care AEB PCP  Patient/Guardian was advised Release of Information must be obtained prior to any record release in order to collaborate their care with an outside provider. Patient/Guardian was advised if they have not already done so to contact the registration department to sign all necessary forms in order for us  to release information regarding their care.   Consent: Patient/Guardian gives verbal consent for treatment and assignment of benefits for services provided during this visit. Patient/Guardian expressed understanding and agreed to proceed.   Follow-up in 2.5 months Zane FORBES Bach, NP 12/08/2024, 3:49 PM

## 2024-12-09 ENCOUNTER — Other Ambulatory Visit: Payer: Self-pay | Admitting: Family

## 2024-12-09 ENCOUNTER — Other Ambulatory Visit: Payer: Self-pay

## 2024-12-09 DIAGNOSIS — I10 Essential (primary) hypertension: Secondary | ICD-10-CM

## 2024-12-25 ENCOUNTER — Telehealth: Payer: Self-pay

## 2024-12-25 ENCOUNTER — Other Ambulatory Visit: Payer: Self-pay | Admitting: Family

## 2024-12-25 ENCOUNTER — Telehealth: Payer: Self-pay | Admitting: Family

## 2024-12-25 DIAGNOSIS — E1165 Type 2 diabetes mellitus with hyperglycemia: Secondary | ICD-10-CM

## 2024-12-25 NOTE — Telephone Encounter (Signed)
 Schedule appointment. Last office visit 05/21/2024. During the interim report to the Emergency Department/Urgent Care/call 911 for immediate medical evaluation. Also, patient may consider requesting refill from established Endocrinology.

## 2024-12-25 NOTE — Telephone Encounter (Signed)
 Copied from CRM [id number]. Topic: General - Other >> Dec 25, 2024  1:48 PM Larissa S wrote: Reason for CRM: Patient calling to inform provider that he does not have an endocrinologist and he feels that he does not need one. He also asked that the metformin  he requested be sent to:  [facility] [address] ([address]), [place] - [address] [place] ([place]) [place] [address] Phone: [phone] Fax: [phone] Hours: Not open 24 hours

## 2024-12-25 NOTE — Telephone Encounter (Signed)
 Last OV 10/28/25-requesting metformin , please advise

## 2024-12-25 NOTE — Telephone Encounter (Signed)
 LVM--last OV 05/21/2024 to consider calling the endocrinology office to request refill for metformin .

## 2024-12-25 NOTE — Telephone Encounter (Signed)
 Patient medication was sent to [facility] 09/05/2024. Patient will need OV visit for refills

## 2024-12-25 NOTE — Telephone Encounter (Signed)
 Pt requesting refill for metformin , stated he is completely out. Pt had not been in the office due to insurance issues but now has new insurance and has scheduled a follow up appt. Pt scheduled on 02/19. Please advise

## 2024-12-28 ENCOUNTER — Other Ambulatory Visit: Payer: Self-pay | Admitting: Family

## 2024-12-28 DIAGNOSIS — I10 Essential (primary) hypertension: Secondary | ICD-10-CM
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
## 2025-01-13 ENCOUNTER — Other Ambulatory Visit: Payer: Self-pay | Admitting: Family

## 2025-01-13 DIAGNOSIS — Z794 Long term (current) use of insulin: Secondary | ICD-10-CM

## 2025-01-14 NOTE — Telephone Encounter (Signed)
 Schedule appointment. Last office visit 05/21/2024. Also, patient may consider sending prescription refill request to established [facility]. During the interim report to the Emergency Department/Urgent Care/call 911 for immediate medical evaluation.

## 2025-01-16 NOTE — Telephone Encounter (Signed)
 Noted.

## 2025-01-29 ENCOUNTER — Ambulatory Visit: Payer: Self-pay | Admitting: Family

## 2025-02-19 ENCOUNTER — Telehealth (HOSPITAL_COMMUNITY): Payer: Self-pay | Admitting: Psychiatry
